# Patient Record
Sex: Male | Born: 1935
Health system: Southern US, Community
[De-identification: ages and names within clinical notes are randomized; demographics above are authoritative.]

## PROBLEM LIST (undated history)

## (undated) DIAGNOSIS — H9190 Unspecified hearing loss, unspecified ear: Secondary | ICD-10-CM

## (undated) DIAGNOSIS — K8020 Calculus of gallbladder without cholecystitis without obstruction: Secondary | ICD-10-CM

## (undated) DIAGNOSIS — E785 Hyperlipidemia, unspecified: Secondary | ICD-10-CM

## (undated) DIAGNOSIS — R351 Nocturia: Secondary | ICD-10-CM

## (undated) DIAGNOSIS — K219 Gastro-esophageal reflux disease without esophagitis: Secondary | ICD-10-CM

## (undated) DIAGNOSIS — N400 Enlarged prostate without lower urinary tract symptoms: Secondary | ICD-10-CM

## (undated) DIAGNOSIS — I10 Essential (primary) hypertension: Secondary | ICD-10-CM

## (undated) DIAGNOSIS — R3915 Urgency of urination: Secondary | ICD-10-CM

## (undated) DIAGNOSIS — N200 Calculus of kidney: Secondary | ICD-10-CM

## (undated) DIAGNOSIS — D494 Neoplasm of unspecified behavior of bladder: Secondary | ICD-10-CM

## (undated) DIAGNOSIS — C801 Malignant (primary) neoplasm, unspecified: Secondary | ICD-10-CM

## (undated) DIAGNOSIS — IMO0001 Reserved for inherently not codable concepts without codable children: Secondary | ICD-10-CM

## (undated) HISTORY — PX: TONSILLECTOMY: SUR1361

---

## 1993-10-22 HISTORY — PX: TRANSURETHRAL RESECTION OF PROSTATE: SHX73

## 1998-04-15 ENCOUNTER — Inpatient Hospital Stay (HOSPITAL_COMMUNITY): Admission: RE | Admit: 1998-04-15 | Discharge: 1998-04-18 | Payer: Self-pay | Admitting: Urology

## 1998-05-14 ENCOUNTER — Emergency Department (HOSPITAL_COMMUNITY): Admission: EM | Admit: 1998-05-14 | Discharge: 1998-05-14 | Payer: Self-pay | Admitting: Emergency Medicine

## 1998-05-15 ENCOUNTER — Emergency Department (HOSPITAL_COMMUNITY): Admission: EM | Admit: 1998-05-15 | Discharge: 1998-05-15 | Payer: Self-pay | Admitting: Emergency Medicine

## 1999-05-07 ENCOUNTER — Emergency Department (HOSPITAL_COMMUNITY): Admission: EM | Admit: 1999-05-07 | Discharge: 1999-05-07 | Payer: Self-pay | Admitting: Emergency Medicine

## 1999-05-07 ENCOUNTER — Encounter: Payer: Self-pay | Admitting: Emergency Medicine

## 1999-06-15 ENCOUNTER — Encounter: Admission: RE | Admit: 1999-06-15 | Discharge: 1999-09-13 | Payer: Self-pay | Admitting: Internal Medicine

## 2000-08-28 ENCOUNTER — Encounter: Admission: RE | Admit: 2000-08-28 | Discharge: 2000-08-28 | Payer: Self-pay | Admitting: Urology

## 2000-08-28 ENCOUNTER — Encounter: Payer: Self-pay | Admitting: Urology

## 2003-02-02 ENCOUNTER — Encounter: Payer: Self-pay | Admitting: Emergency Medicine

## 2003-02-02 ENCOUNTER — Emergency Department (HOSPITAL_COMMUNITY): Admission: EM | Admit: 2003-02-02 | Discharge: 2003-02-02 | Payer: Self-pay | Admitting: Emergency Medicine

## 2008-09-06 ENCOUNTER — Encounter (HOSPITAL_BASED_OUTPATIENT_CLINIC_OR_DEPARTMENT_OTHER): Admission: RE | Admit: 2008-09-06 | Discharge: 2008-09-15 | Payer: Self-pay | Admitting: Internal Medicine

## 2011-03-06 NOTE — Consult Note (Signed)
NAME:  Ryan Tyler, NEIDIG NO.:  [id number]   MEDICAL RECORD NO.:  [id number]          PATIENT TYPE:  REC   LOCATION:  FOOT                         FACILITY:  [facility]   PHYSICIAN:  Jonelle Sports. Sevier, M.D. DATE OF BIRTH:  1928-03-03   DATE OF CONSULTATION:  09/08/2008  DATE OF DISCHARGE:                                 CONSULTATION   This is a 77 year old white male who was seen at the courtesy of Dr.  Johnella Moloney for assistance with management of a popliteal wound on the  right.   The patient has generally enjoyed quite excellent health with exception  of some prostate difficulties and known hypertension.  Several months  ago, he accidentally fell in a well in [place] and sustained a major  open wound to the right popliteus.  This required initial  hospitalization for debridement, antibiotic therapy and suturing both  deep and cutaneous suturing.  At that time, he was released from care  there.  He still had an open wound about the size of a golf ball as he  says.  Since that time, he has been keeping the area clean using  Neosporin on it and although it has progressively decreased in size, it  has not healed so he is here today for our consideration.   His past medical history is really quite innocent as previously  reported.  He has had no surgeries.   He has no known medicinal allergies.   His regular medications include Flomax 0.4 mg daily, lisinopril,  hydrochlorothiazide 20/25 one daily and baby aspirin one daily.   Family history is not contributory to his present problem and is not  reviewed in detail at this time.   PERSONAL HISTORY:  The patient is widowed, lives alone, does not smoke,  use alcoholic beverages or recreational drugs.  He is retired.  He had  tetanus immunization in September 2009.   REVIEW OF SYSTEMS:  Again except for the symptoms of BPH for which he is  on treatment with Flomax as indicated above and for known hypertension  which  he says has been adequately controlled with his medications, he  really has a largely negative review of systems.   It has been mentioned that with his present illness, he has had no  recognized deep infection from the time of the initial wound.  He does  report that he has a tendency toward like sweating with no fever.  It  was thought originally this might be due to his antibiotic therapy but  it has not resolved with discontinuation of that therapy.   EXAMINATION:  Alert, cooperative white male who appears his stated age  of 44 in no immediate distress.  His blood pressure is 167/89, pulse is  77, respirations 20, temperature 98.1.  In the right posterior popliteal  fossa is a small ulcerated area measuring 1.0 x 1.1 x 0.1 cm with some  crusting at its margins but no spreading erythema or other worrisome  signs.  There is minimal edema in that extremity but nothing that should  be adequate to  impair wound healing.   IMPRESSION:  Traumatic wound right popliteal fossa near resolution.   DISPOSITION:  1. The patient today is subjective to debridement of the crust from      this wound and this leaves a nicely healing satisfactory wound      base.  Does not have the appearance of a chronic wound and there is      no evidence for deep or spreading infection.   He was advised to continue cleansing this on a daily basis, then  covering with Neosporin as he has done and in turn using a Band-Aid.   1. With respect to his night sweating, he says that he has taken his      temperature and that he has no fever.  It is noted that he takes      his Flomax around 10 o'clock each evening and I wonder if this is      not a reaction to that medication.  He is to advise to discuss that      with his primary physician and/or with his urologist.   The patient is advised that he may return here on a p.r.n. basis but  that we would not anticipate he will need any more specific care here.  We do  recommend that he keep the wound covered with daily dressing  changes as indicated above and is told again anticipate its healing  probably within the next 2-4 weeks.   We would be happy to see him and return at any point should this be  indicated.           ______________________________  Jonelle Sports. Cheryll Cockayne, M.D.     RES/MEDQ  D:  09/08/2008  T:  09/09/2008  Job:  [id number]   cc:   Candyce Churn, M.D.

## 2011-05-21 ENCOUNTER — Emergency Department (HOSPITAL_COMMUNITY): Payer: Medicare Other

## 2011-05-21 ENCOUNTER — Encounter (HOSPITAL_COMMUNITY): Payer: Self-pay

## 2011-05-21 ENCOUNTER — Emergency Department (HOSPITAL_COMMUNITY)
Admission: EM | Admit: 2011-05-21 | Discharge: 2011-05-21 | Disposition: A | Discharge status: Home / Self Care | Payer: Medicare Other | Attending: Emergency Medicine | Admitting: Emergency Medicine

## 2011-05-21 DIAGNOSIS — R1031 Right lower quadrant pain: Secondary | ICD-10-CM | POA: Insufficient documentation

## 2011-05-21 DIAGNOSIS — R079 Chest pain, unspecified: Secondary | ICD-10-CM | POA: Insufficient documentation

## 2011-05-21 DIAGNOSIS — K8020 Calculus of gallbladder without cholecystitis without obstruction: Secondary | ICD-10-CM | POA: Insufficient documentation

## 2011-05-21 DIAGNOSIS — R110 Nausea: Secondary | ICD-10-CM | POA: Insufficient documentation

## 2011-05-21 DIAGNOSIS — N400 Enlarged prostate without lower urinary tract symptoms: Secondary | ICD-10-CM | POA: Insufficient documentation

## 2011-05-21 DIAGNOSIS — I10 Essential (primary) hypertension: Secondary | ICD-10-CM | POA: Insufficient documentation

## 2011-05-21 HISTORY — DX: Essential (primary) hypertension: I10

## 2011-05-21 LAB — CBC
HCT: 50.6 % (ref 39.0–52.0)
Hemoglobin: 16.8 g/dL (ref 13.0–17.0)
MCH: 30.3 pg (ref 26.0–34.0)
MCHC: 33.2 g/dL (ref 30.0–36.0)
MCV: 91.2 fL (ref 78.0–100.0)
Platelets: 182 10*3/uL (ref 150–400)
RBC: 5.55 MIL/uL (ref 4.22–5.81)
RDW: 12.8 % (ref 11.5–15.5)
WBC: 9.9 10*3/uL (ref 4.0–10.5)

## 2011-05-21 LAB — DIFFERENTIAL
Basophils Absolute: 0 10*3/uL (ref 0.0–0.1)
Basophils Relative: 0 % (ref 0–1)
Eosinophils Absolute: 0.1 10*3/uL (ref 0.0–0.7)
Eosinophils Relative: 1 % (ref 0–5)
Lymphocytes Relative: 13 % (ref 12–46)
Lymphs Abs: 1.3 10*3/uL (ref 0.7–4.0)
Monocytes Absolute: 0.7 10*3/uL (ref 0.1–1.0)
Monocytes Relative: 7 % (ref 3–12)
Neutro Abs: 7.8 10*3/uL — ABNORMAL HIGH (ref 1.7–7.7)
Neutrophils Relative %: 79 % — ABNORMAL HIGH (ref 43–77)

## 2011-05-21 LAB — COMPREHENSIVE METABOLIC PANEL
ALT: 24 U/L (ref 0–53)
AST: 25 U/L (ref 0–37)
Albumin: 4.4 g/dL (ref 3.5–5.2)
Alkaline Phosphatase: 95 U/L (ref 39–117)
BUN: 14 mg/dL (ref 6–23)
CO2: 28 mEq/L (ref 19–32)
Calcium: 10.2 mg/dL (ref 8.4–10.5)
Chloride: 99 mEq/L (ref 96–112)
Creatinine, Ser: 0.91 mg/dL (ref 0.50–1.35)
GFR calc Af Amer: >60 mL/min (ref >60)
GFR calc non Af Amer: >60 mL/min (ref >60)
Glucose, Bld: 113 mg/dL — ABNORMAL HIGH (ref 70–99)
Potassium: 3.8 mEq/L (ref 3.5–5.1)
Sodium: 139 mEq/L (ref 135–145)
Total Bilirubin: 1 mg/dL (ref 0.3–1.2)
Total Protein: 7.1 g/dL (ref 6.0–8.3)

## 2011-05-21 LAB — URINALYSIS, ROUTINE W REFLEX MICROSCOPIC
Bilirubin Urine: NEGATIVE
Glucose, UA: NEGATIVE mg/dL
Hgb urine dipstick: NEGATIVE
Ketones, ur: 15 mg/dL — AB
Leukocytes, UA: NEGATIVE
Nitrite: NEGATIVE
Protein, ur: NEGATIVE mg/dL
Specific Gravity, Urine: 1.014 (ref 1.005–1.030)
Urobilinogen, UA: 1 mg/dL (ref 0.0–1.0)
pH: 7 (ref 5.0–8.0)

## 2011-05-21 LAB — TROPONIN I: Troponin I: <0.3 ng/mL (ref <0.30)

## 2011-05-21 LAB — LIPASE, BLOOD: Lipase: 44 U/L (ref 11–59)

## 2011-05-21 MED ORDER — IOHEXOL 300 MG/ML  SOLN
100.0000 mL | Freq: Once | INTRAMUSCULAR | Status: AC | PRN
  Administered 2011-05-21: 100 mL via INTRAVENOUS

## 2011-05-22 ENCOUNTER — Telehealth (INDEPENDENT_AMBULATORY_CARE_PROVIDER_SITE_OTHER): Payer: Self-pay | Admitting: General Surgery

## 2011-05-23 ENCOUNTER — Telehealth (INDEPENDENT_AMBULATORY_CARE_PROVIDER_SITE_OTHER): Payer: Self-pay

## 2011-05-23 NOTE — Telephone Encounter (Signed)
LMOM for pt to call and reschedule an earlier appt with another provider.  Dr. Donell Beers unavailable.

## 2011-05-28 ENCOUNTER — Ambulatory Visit (INDEPENDENT_AMBULATORY_CARE_PROVIDER_SITE_OTHER): Payer: Medicare Other | Admitting: General Surgery

## 2011-05-29 ENCOUNTER — Ambulatory Visit
Admission: RE | Admit: 2011-05-29 | Discharge: 2011-05-29 | Disposition: A | Discharge status: Home / Self Care | Payer: Medicare Other | Source: Ambulatory Visit | Attending: Internal Medicine | Admitting: Internal Medicine

## 2011-05-29 ENCOUNTER — Other Ambulatory Visit: Payer: Medicare Other

## 2011-05-29 ENCOUNTER — Other Ambulatory Visit: Payer: Self-pay | Admitting: Internal Medicine

## 2011-05-29 DIAGNOSIS — R1013 Epigastric pain: Secondary | ICD-10-CM

## 2011-05-29 DIAGNOSIS — R634 Abnormal weight loss: Secondary | ICD-10-CM

## 2011-05-29 DIAGNOSIS — K769 Liver disease, unspecified: Secondary | ICD-10-CM

## 2011-05-29 MED ORDER — GADOBENATE DIMEGLUMINE 529 MG/ML IV SOLN
17.0000 mL | Freq: Once | INTRAVENOUS | Status: AC | PRN
  Administered 2011-05-29: 17 mL via INTRAVENOUS

## 2011-06-07 ENCOUNTER — Ambulatory Visit (INDEPENDENT_AMBULATORY_CARE_PROVIDER_SITE_OTHER): Payer: Medicare Other | Admitting: General Surgery

## 2011-06-22 ENCOUNTER — Ambulatory Visit (INDEPENDENT_AMBULATORY_CARE_PROVIDER_SITE_OTHER): Payer: Medicare Other | Admitting: General Surgery

## 2011-06-22 ENCOUNTER — Encounter (INDEPENDENT_AMBULATORY_CARE_PROVIDER_SITE_OTHER): Payer: Self-pay | Admitting: General Surgery

## 2011-06-22 VITALS — BP 136/76 | HR 80 | Temp 96.7°F | Ht 72.0 in | Wt 170.5 lb

## 2011-06-22 DIAGNOSIS — R109 Unspecified abdominal pain: Secondary | ICD-10-CM

## 2011-06-22 DIAGNOSIS — K8020 Calculus of gallbladder without cholecystitis without obstruction: Secondary | ICD-10-CM

## 2011-06-22 DIAGNOSIS — K4090 Unilateral inguinal hernia, without obstruction or gangrene, not specified as recurrent: Secondary | ICD-10-CM

## 2011-06-22 NOTE — Progress Notes (Signed)
Chief Complaint  Patient presents with  . Other     new pt- eval of gallstones and hernia     HPI Ryan Tyler is a 77 y.o. male.  This patient is referred by Dr. Kevan Ny for evaluation of gallstone found on abdominal MRI as well as for left inguinal hernia. He states that he has had multiple episodes of pain across his abdomen after eating which he describes as "fire in his belly". He states that this usually lasts 4-5 hours and usually occurs after eating dinner in the meantime. He has some associated nausea but no vomiting and he actually complains more of back pain but this has not necessarily after eating. He does have some reflux and some belching and he takes some Prilosec for treatment of this but it has not caused any relief of his back pain or abdominal pain. He states that he is normally constipated and takes a stool softener for this but denies any blood in the stool or melena. He has not had any prior colonoscopy before and has had a 20 pound weight loss. Currently he is on a low-fat diet to help with relief of his symptoms.He also has a reducible left inguinal bulge on exam which is large and has been increasing in size but he states that this has been present for many years and does not cause him any symptoms.  HPI  Past Medical History  Diagnosis Date  . Hypertension   . Prostatic hypertrophy   . Weight loss   . Hearing loss   . Abdominal pain   . Constipation   . Difficulty urinating   . Easy bruising     Past Surgical History  Procedure Date  . Prostate surgery     History reviewed. No pertinent family history.  Social History History  Substance Use Topics  . Smoking status: Never Smoker   . Smokeless tobacco: Not on file  . Alcohol Use: Yes    No Known Allergies  Current Outpatient Prescriptions  Medication Sig Dispense Refill  . aspirin 81 MG tablet Take 81 mg by mouth daily.        Marland Kitchen HYDROcodone-acetaminophen (NORCO) 5-325 MG per tablet       .  lisinopril-hydrochlorothiazide (PRINZIDE,ZESTORETIC) 20-25 MG per tablet       . omeprazole (PRILOSEC) 20 MG capsule Take 20 mg by mouth daily.        . sildenafil (VIAGRA) 50 MG tablet Take 50 mg by mouth daily as needed.        Marland Kitchen RAPAFLO 8 MG CAPS capsule         Review of Systems Review of Systems  Constitutional: Positive for unexpected weight change.  HENT: Positive for hearing loss.   Gastrointestinal: Positive for nausea, abdominal pain and constipation.  Genitourinary: Positive for difficulty urinating.  Skin: Positive for wound.  Hematological: Bruises/bleeds easily.  All other systems reviewed and are negative.    Blood pressure 136/76, pulse 80, temperature 96.7 F (35.9 C), height 6' (1.829 m), weight 170 lb 8 oz (77.338 kg).  Physical Exam Physical Exam  Constitutional: He is oriented to person, place, and time. He appears well-developed and well-nourished. No distress.  HENT:  Head: Normocephalic and atraumatic.  Eyes: Conjunctivae and EOM are normal. Pupils are equal, round, and reactive to light.  Neck: Normal range of motion. Neck supple. No tracheal deviation present.  Cardiovascular: Normal rate, regular rhythm and normal heart sounds.   Pulmonary/Chest: Effort normal and breath  sounds normal. No stridor.  Abdominal: Soft. Bowel sounds are normal. He exhibits no distension and no mass. There is no tenderness. There is no rebound and no guarding.  Musculoskeletal: Normal range of motion. He exhibits no edema.  Neurological: He is alert and oriented to person, place, and time.  Skin: Skin is warm and dry. No rash noted. He is not diaphoretic. No erythema. No pallor.  Psychiatric: He has a normal mood and affect. His behavior is normal. Judgment and thought content normal.      Assessment    Abdominal pain and cholelithiasis.  Although he does have postprandial upper abdominal discomfort, as well as a gallstone found on abdominal MRI, I am not certain that  his abdominal pain is due to the cholelithiasis. It certainly could be attributed to it, and I discussed this with him. I just am not sure that he is having enough symptoms to really pinpoint this to the gallbladder.  Reducible left internal hernia.  He does have a very large reducible, left inguinal hernia on exam. This is currently asymptomatic and has been present for several years. He does not seem very interested in repair of this now. We can discuss the risks of incarceration stimulation and he expressed understanding.    Plan    I discussed these problems with the patient and I called his primary provider Dr. Kevan Ny to discuss them as well. Though I think that some of his abdominal symptoms could certainly be attributed to the cholelithiasis, I'm not certain that this is his cause of the pain. After discussion of the situation with Dr. Kevan Ny his primary provider, we will plan to see him back in one month for repeat evaluation. If he is still having abdominal symptoms then I would consider cholecystectomy for him. In the meantime, I would recommend colonoscopy given his weight loss and that he has not had any prior colonoscopy. Also with regard to his reducible hernia, we will plan for watchful waiting with this since he is asymptomatic and is not interested in repair at this time.       Lodema Pilot DAVID 06/22/2011, 1:37 PM

## 2011-06-22 NOTE — Patient Instructions (Signed)
Call us back at [phone] if interested in surgery. Follow up with gastroenterologist for screening colonoscopy

## 2012-10-19 ENCOUNTER — Emergency Department (HOSPITAL_COMMUNITY)
Admission: EM | Admit: 2012-10-19 | Discharge: 2012-10-20 | Disposition: A | Discharge status: Home / Self Care | Payer: Medicare Other | Attending: Emergency Medicine | Admitting: Emergency Medicine

## 2012-10-19 ENCOUNTER — Emergency Department (HOSPITAL_COMMUNITY): Payer: Medicare Other

## 2012-10-19 ENCOUNTER — Encounter (HOSPITAL_COMMUNITY): Payer: Self-pay | Admitting: *Deleted

## 2012-10-19 DIAGNOSIS — Z79899 Other long term (current) drug therapy: Secondary | ICD-10-CM | POA: Insufficient documentation

## 2012-10-19 DIAGNOSIS — B9789 Other viral agents as the cause of diseases classified elsewhere: Secondary | ICD-10-CM | POA: Insufficient documentation

## 2012-10-19 DIAGNOSIS — R059 Cough, unspecified: Secondary | ICD-10-CM

## 2012-10-19 DIAGNOSIS — R05 Cough: Secondary | ICD-10-CM

## 2012-10-19 DIAGNOSIS — B349 Viral infection, unspecified: Secondary | ICD-10-CM

## 2012-10-19 DIAGNOSIS — R0981 Nasal congestion: Secondary | ICD-10-CM

## 2012-10-19 DIAGNOSIS — R509 Fever, unspecified: Secondary | ICD-10-CM | POA: Insufficient documentation

## 2012-10-19 DIAGNOSIS — Z7982 Long term (current) use of aspirin: Secondary | ICD-10-CM | POA: Insufficient documentation

## 2012-10-19 DIAGNOSIS — Z8719 Personal history of other diseases of the digestive system: Secondary | ICD-10-CM | POA: Insufficient documentation

## 2012-10-19 DIAGNOSIS — Z8619 Personal history of other infectious and parasitic diseases: Secondary | ICD-10-CM | POA: Insufficient documentation

## 2012-10-19 DIAGNOSIS — J3489 Other specified disorders of nose and nasal sinuses: Secondary | ICD-10-CM | POA: Insufficient documentation

## 2012-10-19 DIAGNOSIS — Z87898 Personal history of other specified conditions: Secondary | ICD-10-CM | POA: Insufficient documentation

## 2012-10-19 DIAGNOSIS — I10 Essential (primary) hypertension: Secondary | ICD-10-CM | POA: Insufficient documentation

## 2012-10-19 DIAGNOSIS — Z8669 Personal history of other diseases of the nervous system and sense organs: Secondary | ICD-10-CM | POA: Insufficient documentation

## 2012-10-19 DIAGNOSIS — Z87448 Personal history of other diseases of urinary system: Secondary | ICD-10-CM | POA: Insufficient documentation

## 2012-10-19 NOTE — ED Notes (Signed)
Pt c/o fever off and on since yesterday; chills; minimal cough; concerned due to recent loss of two brothers with pneumonia

## 2012-10-19 NOTE — ED Notes (Signed)
OZH:[id number]<HQ> Expected date:10/19/12<BR> Expected time: 7:55 PM<BR> Means of arrival:<BR> Comments:<BR> closed

## 2012-10-19 NOTE — ED Notes (Signed)
Bed:WA05<BR> Expected date:<BR> Expected time:<BR> Means of arrival:<BR> Comments:<BR>

## 2012-10-20 LAB — URINALYSIS, ROUTINE W REFLEX MICROSCOPIC
Bilirubin Urine: NEGATIVE
Glucose, UA: NEGATIVE mg/dL
Ketones, ur: NEGATIVE mg/dL
Nitrite: NEGATIVE
Protein, ur: NEGATIVE mg/dL
Specific Gravity, Urine: 1.025 (ref 1.005–1.030)
Urobilinogen, UA: 1 mg/dL (ref 0.0–1.0)
pH: 6 (ref 5.0–8.0)

## 2012-10-20 LAB — CBC WITH DIFFERENTIAL/PLATELET
Basophils Absolute: 0 10*3/uL (ref 0.0–0.1)
Basophils Relative: 0 % (ref 0–1)
Eosinophils Absolute: 0 10*3/uL (ref 0.0–0.7)
Eosinophils Relative: 0 % (ref 0–5)
HCT: 46.7 % (ref 39.0–52.0)
Hemoglobin: 15.8 g/dL (ref 13.0–17.0)
Lymphocytes Relative: 4 % — ABNORMAL LOW (ref 12–46)
Lymphs Abs: 0.3 10*3/uL — ABNORMAL LOW (ref 0.7–4.0)
MCH: 31.2 pg (ref 26.0–34.0)
MCHC: 33.8 g/dL (ref 30.0–36.0)
MCV: 92.3 fL (ref 78.0–100.0)
Monocytes Absolute: 0.7 10*3/uL (ref 0.1–1.0)
Monocytes Relative: 9 % (ref 3–12)
Neutro Abs: 7.3 10*3/uL (ref 1.7–7.7)
Neutrophils Relative %: 87 % — ABNORMAL HIGH (ref 43–77)
Platelets: 153 10*3/uL (ref 150–400)
RBC: 5.06 MIL/uL (ref 4.22–5.81)
RDW: 13.4 % (ref 11.5–15.5)
WBC: 8.3 10*3/uL (ref 4.0–10.5)

## 2012-10-20 LAB — BASIC METABOLIC PANEL
BUN: 16 mg/dL (ref 6–23)
CO2: 24 mEq/L (ref 19–32)
Calcium: 8.7 mg/dL (ref 8.4–10.5)
Chloride: 100 mEq/L (ref 96–112)
Creatinine, Ser: 1.03 mg/dL (ref 0.50–1.35)
GFR calc Af Amer: 76 mL/min — ABNORMAL LOW (ref >90)
GFR calc non Af Amer: 66 mL/min — ABNORMAL LOW (ref >90)
Glucose, Bld: 132 mg/dL — ABNORMAL HIGH (ref 70–99)
Potassium: 4.2 mEq/L (ref 3.5–5.1)
Sodium: 134 mEq/L — ABNORMAL LOW (ref 135–145)

## 2012-10-20 LAB — URINE MICROSCOPIC-ADD ON

## 2012-10-20 MED ORDER — IBUPROFEN 200 MG PO TABS
400.0000 mg | ORAL_TABLET | Freq: Once | ORAL | Status: AC
  Administered 2012-10-20: 400 mg via ORAL
  Filled 2012-10-20: qty 2

## 2012-10-20 MED ORDER — GUAIFENESIN-CODEINE 100-10 MG/5ML PO SYRP: 5.0000 mL | ORAL_SOLUTION | Freq: Three times a day (TID) | ORAL | Status: DC | PRN

## 2012-10-20 NOTE — ED Provider Notes (Signed)
History     CSN: [id number]  Arrival date & time 10/19/12  2121   First MD Initiated Contact with Patient 10/20/12 0012      No chief complaint on file.   (Consider location/radiation/quality/duration/timing/severity/associated sxs/prior treatment) HPIPaul Shela Commons Tyler is a 78 y.o. male presents with a history of congestion, rhinorrhea, mild sore throat and a cough is been going on for a few days. Patient was concerned because he took; night, woke up and felt hot, his son took his temperature he had a temperature of 103.8. Patient's cough has been nonproductive. He is concerned because he had a recent also 2 of his brothers with pneumonia. He's not been dizzy, no lightheadedness, no headache, no chest pain, no shortness of breath, no abdominal pain, nausea vomiting or diarrhea, no ear pain or sinus pressure.   Past Medical History  Diagnosis Date  . Hypertension   . Prostatic hypertrophy   . Weight loss   . Hearing loss   . Abdominal pain   . Constipation   . Difficulty urinating   . Easy bruising     Past Surgical History  Procedure Date  . Prostate surgery     No family history on file.  History  Substance Use Topics  . Smoking status: Never Smoker   . Smokeless tobacco: Not on file  . Alcohol Use: Yes     Review of Systems At least 10pt or greater review of systems completed and are negative except where specified in the HPI.  Allergies  Review of patient's allergies indicates no known allergies.  Home Medications   Current Outpatient Rx  Name  Route  Sig  Dispense  Refill  . ASPIRIN 81 MG PO TABS   Oral   Take 81 mg by mouth daily.           . GUAIFENESIN-CODEINE 100-10 MG/5ML PO SYRP   Oral   Take 5 mLs by mouth 3 (three) times daily as needed for cough.   120 mL   0   . HYDROCODONE-ACETAMINOPHEN 5-325 MG PO TABS               . LISINOPRIL-HYDROCHLOROTHIAZIDE 20-25 MG PO TABS               . OMEPRAZOLE 20 MG PO CPDR   Oral   Take 20 mg  by mouth daily.           Marland Kitchen RAPAFLO 8 MG PO CAPS               . SILDENAFIL CITRATE 50 MG PO TABS   Oral   Take 50 mg by mouth daily as needed.             BP 158/71  Pulse 94  Temp 99.4 F (37.4 C)  Resp 20  SpO2 97%  Physical Exam  Nursing notes reviewed.  Electronic medical record reviewed. VITAL SIGNS:   Filed Vitals:   10/19/12 2136  BP: 158/71  Pulse: 94  Temp: 99.4 F (37.4 C)  Resp: 20  SpO2: 97%   CONSTITUTIONAL: Awake, oriented, appears non-toxic HENT: Atraumatic, normocephalic, oral mucosa pink and moist, airway patent. . External ears normal. EYES: Conjunctiva clear, EOMI, PERRLA NECK: Trachea midline, non-tender, supple CARDIOVASCULAR: Normal heart rate, Normal rhythm, No murmurs, rubs, gallops PULMONARY/CHEST: Clear to auscultation, no rhonchi, wheezes, or rales. Symmetrical breath sounds. Non-tender. ABDOMINAL: Non-distended, soft, non-tender - no rebound or guarding.  BS normal. NEUROLOGIC: Non-focal, moving all four extremities, no gross  sensory or motor deficits. EXTREMITIES: No clubbing, cyanosis, or edema SKIN: Warm, Dry, No erythema, No rash  ED Course  Procedures (including critical care time)  Labs Reviewed  CBC WITH DIFFERENTIAL - Abnormal; Notable for the following:    Neutrophils Relative 87 (*)     Lymphocytes Relative 4 (*)     Lymphs Abs 0.3 (*)     All other components within normal limits  URINALYSIS, ROUTINE W REFLEX MICROSCOPIC - Abnormal; Notable for the following:    Hgb urine dipstick TRACE (*)     Leukocytes, UA TRACE (*)     All other components within normal limits  BASIC METABOLIC PANEL - Abnormal; Notable for the following:    Sodium 134 (*)     Glucose, Bld 132 (*)     GFR calc non Af Amer 66 (*)     GFR calc Af Amer 76 (*)     All other components within normal limits  URINE MICROSCOPIC-ADD ON  LAB REPORT - SCANNED   Dg Chest 2 View  10/19/2012  *RADIOLOGY REPORT*  Clinical Data: Cough and fever.   CHEST - 2 VIEW  Comparison: None.  Findings: Lungs are adequately inflated without consolidation or effusion.  The cardiomediastinal silhouette is within normal. There are several old posterior lateral right rib fractures.  There is mild spondylosis of the spine.  IMPRESSION: No acute cardiopulmonary disease.   Original Report Authenticated By: Elberta Fortis, M.D.      1. Viral syndrome   2. Fever   3. Nasal congestion   4. Cough       MDM  Ryan Tyler is a 78 y.o. male presenting with viral type upper respiratory symptoms, including cough. Patient's concern for pneumonia-chest x-ray is clear, lungs are clear. Patient does not have an elevated white count. She does not complaining about any dysuria or frequency and urinalysis is unremarkable.  I think the patient's fevers and symptoms are likely caused by a viral upper store syndrome he is nontoxic, we'll discharge her with symptomatic prescription.  During patient encounter, patient's son wasn't disruptive in the emergency department do to long wait times, was in the hallway saying "this is malpractice" - charge nurse and physician calm patient down and achieved good service recovery.  Patient was not upset with the service.  I explained the diagnosis and have given explicit precautions to return to the ER including any other new or worsening symptoms. The patient understands and accepts the medical plan as it's been dictated and I have answered their questions. Discharge instructions concerning home care and prescriptions have been given.  The patient is STABLE and is discharged to home in good condition.         Jones Skene, MD 10/20/12 [phone]

## 2013-08-22 HISTORY — PX: CATARACT EXTRACTION W/ INTRAOCULAR LENS IMPLANT: SHX1309

## 2014-03-10 ENCOUNTER — Other Ambulatory Visit: Payer: Self-pay | Admitting: Urology

## 2014-03-30 ENCOUNTER — Encounter (HOSPITAL_BASED_OUTPATIENT_CLINIC_OR_DEPARTMENT_OTHER): Payer: Self-pay | Admitting: *Deleted

## 2014-03-31 ENCOUNTER — Encounter (HOSPITAL_BASED_OUTPATIENT_CLINIC_OR_DEPARTMENT_OTHER): Payer: Self-pay | Admitting: *Deleted

## 2014-03-31 NOTE — Progress Notes (Signed)
03/31/14 1146  OBSTRUCTIVE SLEEP APNEA  Have you ever been diagnosed with sleep apnea through a sleep study? No  Do you snore loudly (loud enough to be heard through closed doors)?  0  Do you often feel tired, fatigued, or sleepy during the daytime? 0  Has anyone observed you stop breathing during your sleep? 0  Do you have, or are you being treated for high blood pressure? 1  BMI more than 35 kg/m2? 0  Age over 78 years old? 1  Neck circumference greater than 40 cm/16 inches? 1  Gender: 1  Obstructive Sleep Apnea Score 4  Score 4 or greater  Results sent to PCP

## 2014-03-31 NOTE — Progress Notes (Signed)
NPO AFTER MN. ARRIVE AT 0600.  NEEDS ISTAT AND EKG. PT IS VERY HOH.

## 2014-04-04 NOTE — H&P (Signed)
History of Present Illness    BPH with LUTS: He underwent a TURP in 1994 and in 1999. He did well for some time and then developed frequency, urgency and nocturia. He was placed on Flomax with good control of the symptoms however he eventually stopped his medication.    Nephrolithiasis: He has a distant history of nephrolithiasis treated in 8/93. He has subsequently passed stones spontaneously.     Interval history: He experienced gross, painless hematuria that was not associated with any voiding symptoms. No new complaints were noted today. He's not had any further hematuria.   Past Medical History  Problems  1. History of cholelithiasis (V12.79) 2. History of hypercholesterolemia (V12.29) 3. History of hypertension (V12.59) 4. History of Left inguinal hernia (550.90)     Surgical History Problems  1. History of Tonsillectomy 2. History of Transurethral Resection Of Prostate (TURP)  Current Meds 1. Aspirin 81 MG Oral Tablet;  Therapy: (Recorded:21Mar2013) to Recorded 2. Centrum Silver Oral Tablet;  Therapy: (Recorded:30Apr2012) to Recorded 3. Lisinopril-Hydrochlorothiazide 20-25 MG Oral Tablet;  Therapy: (Recorded:27Feb2009) to Recorded 4. Simvastatin TABS;  Therapy: (Recorded:07May2015) to Recorded  Allergies Medication  1. No Known Drug Allergies  Family History Problems  1. Family history of Family Health Status Number Of Children   1 daughter 2. No pertinent family history : Mother  Social History Problems  1. Alcohol Use 2. Caffeine Use   2 3. Family history of Death In The Family Father   age 88 cancer 22. Family history of Death In The Family Mother   age 62 cancer 17. Marital History - Single   wife-deceased 6. Never a smoker 7. Occupation:   retired 26. History of Tobacco Use (V15.82)   1 pack, 8 years, quit 22 years ago   Review of Systems Genitourinary, constitutional, skin, eye, otolaryngeal, hematologic/lymphatic, cardiovascular,  pulmonary, endocrine, musculoskeletal, gastrointestinal, neurological and psychiatric system(s) were reviewed and pertinent findings if present are noted.  Genitourinary: urinary frequency, feelings of urinary urgency, nocturia, difficulty starting the urinary stream and urinary stream starts and stops, but no dysuria.  Constitutional: night sweats.  Musculoskeletal: back pain.    Vitals Height: 6 ft  Weight: 186 lb  BMI Calculated: 25.23 BSA Calculated: 2.07 Blood Pressure: 167 / 84 Temperature: 97.5 F Heart Rate: 68  Physical Exam Constitutional: Well nourished and well developed . No acute distress. Appears younger than his stated age.  ENT:. The ears and nose are normal in appearance.  Neck: The appearance of the neck is normal and no neck mass is present.  Pulmonary: No respiratory distress and normal respiratory rhythm and effort.  Cardiovascular: Heart rate and rhythm are normal . No peripheral edema.  Abdomen: The abdomen is soft and nontender. No masses are palpated. No CVA tenderness. No hernias are palpable. No hepatosplenomegaly noted.  Rectal: Rectal exam demonstrates normal sphincter tone, no tenderness and no masses. The prostate has no nodularity and is not tender. The left seminal vesicle is nonpalpable. The right seminal vesicle is nonpalpable. The perineum is normal on inspection. Genitourinary: Examination of the penis demonstrates no discharge, no masses, no lesions and a normal meatus. The scrotum is without lesions. The right epididymis is palpably normal and non-tender. The left epididymis is palpably normal and non-tender. The right testis is non-tender and without masses. The left testis is non-tender and without masses.  Lymphatics: The femoral and inguinal nodes are not enlarged or tender.  Skin: Normal skin turgor, no visible rash and no visible skin lesions.  Neuro/Psych:. Mood and affect are appropriate.    BUN & CREATININE [id number] 02:27PM Kathie Rhodes SPECIMEN TYPE: BLOOD  Test Name Result Flag Reference CREATININE 1.03 mg/dL  0.50-1.50 BUN 24 mg/dL H 6-23 Est GFR, African American 77 mL/min   Est GFR, NonAfrican American 66 mL/min   THE ESTIMATED GFR IS A CALCULATION VALID FOR ADULTS (>=54 YEARS OLD) THAT USES THE CKD-EPI ALGORITHM TO ADJUST FOR AGE AND SEX. IT IS   NOT TO BE USED FOR CHILDREN, PREGNANT WOMEN, HOSPITALIZED PATIENTS,    PATIENTS ON DIALYSIS, OR WITH RAPIDLY CHANGING KIDNEY FUNCTION. ACCORDING TO THE NKDEP, EGFR >89 IS NORMAL, 60-89 SHOWS MILD IMPAIRMENT, 30-59 SHOWS MODERATE IMPAIRMENT, 15-29 SHOWS SEVERE IMPAIRMENT AND <15 IS ESRD.  PSA REFLEX TO FREE [id number] 02:27PM Kathie Rhodes SPECIMEN TYPE: BLOOD  Test Name Result Flag Reference PSA 1.55 ng/mL  <=4.00 TEST METHODOLOGY: ECLIA PSA (ELECTROCHEMILUMINESCENCE IMMUNOASSAY)  Procedure  Cystoscopy done on 03/09/14  Indication: Hematuria.  Informed Consent: Risks, benefits, and potential adverse events were discussed and informed consent was obtained from the patient.  Prep: The patient was prepped with betadine.  Procedure Note:  Urethral meatus:. No abnormalities.  Anterior urethra: No abnormalities.  Prostatic urethra:. There was a lot of prosthetic regrowth that was lobular and appeared to be partially obstructing.  Bladder: Visulization was clear. The ureteral orifices were in the normal anatomic position bilaterally and had clear efflux of urine. The mucosa was smooth without abnormalities. A solitary tumor was visualized in the bladder. A papillary tumor was seen in the bladder. This tumor was located on the right side, at the neck of the bladder. The patient tolerated the procedure well.  Complications: None.    Assessment   I went over the results of his workup which has revealed a normal creatinine of 1.03, a normal PSA of 1.55 and a negative urine culture.   His CT scan has revealed bilateral, nonobstructing renal calculi (Hounsfield units  of 1300) with no ureteral stones noted.  Bilateral renal cysts were identified on the CT scan and these appear stable compared to his previous CT scan and MRI scan in 7/12.     I went over the results of the CT scan as well as my cystoscopic findings today which have revealed a bladder mass consistent with transitional cell carcinoma. We discussed the fact that currently there is no evidence of extravesical extension or pelvic adenopathy based on CT scan findings. Further characterization of the lesion is required for grading and staging purposes. We discussed proceeding with evaluation using transurethral resection of the lesion. I have discussed the procedure in detail as well as the potential risks and complications associated with this form of surgery. We also discussed the probability of successful resection of the intravesical portion of this lesion. I have recommended, as long as there is no contraindication at the time of surgery, the placement of intravesical mitomycin-C in order to reduce the risk of recurrence. We did discuss the potential side effects of this form of intravesical chemotherapy. The procedure will be performed under anesthesia as an outpatient.   Plan   He will be scheduled for outpatient transurethral resection of his bladder tumor and intravesical mitomycin C postoperatively.

## 2014-04-05 ENCOUNTER — Encounter (HOSPITAL_BASED_OUTPATIENT_CLINIC_OR_DEPARTMENT_OTHER)
Admission: RE | Disposition: A | Discharge status: Home / Self Care | Payer: Self-pay | Source: Ambulatory Visit | Attending: Urology

## 2014-04-05 ENCOUNTER — Ambulatory Visit (HOSPITAL_BASED_OUTPATIENT_CLINIC_OR_DEPARTMENT_OTHER)
Admission: RE | Admit: 2014-04-05 | Discharge: 2014-04-05 | Disposition: A | Discharge status: Home / Self Care | Payer: Medicare Other | Source: Ambulatory Visit | Attending: Urology | Admitting: Urology

## 2014-04-05 ENCOUNTER — Ambulatory Visit (HOSPITAL_BASED_OUTPATIENT_CLINIC_OR_DEPARTMENT_OTHER): Payer: Medicare Other | Admitting: Anesthesiology

## 2014-04-05 ENCOUNTER — Encounter (HOSPITAL_BASED_OUTPATIENT_CLINIC_OR_DEPARTMENT_OTHER): Payer: Self-pay | Admitting: *Deleted

## 2014-04-05 ENCOUNTER — Encounter (HOSPITAL_BASED_OUTPATIENT_CLINIC_OR_DEPARTMENT_OTHER): Payer: Medicare Other | Admitting: Anesthesiology

## 2014-04-05 DIAGNOSIS — I10 Essential (primary) hypertension: Secondary | ICD-10-CM | POA: Insufficient documentation

## 2014-04-05 DIAGNOSIS — C679 Malignant neoplasm of bladder, unspecified: Secondary | ICD-10-CM | POA: Insufficient documentation

## 2014-04-05 DIAGNOSIS — Z79899 Other long term (current) drug therapy: Secondary | ICD-10-CM | POA: Insufficient documentation

## 2014-04-05 DIAGNOSIS — E7800 Pure hypercholesterolemia, unspecified: Secondary | ICD-10-CM | POA: Insufficient documentation

## 2014-04-05 DIAGNOSIS — Z7982 Long term (current) use of aspirin: Secondary | ICD-10-CM | POA: Insufficient documentation

## 2014-04-05 DIAGNOSIS — D494 Neoplasm of unspecified behavior of bladder: Secondary | ICD-10-CM

## 2014-04-05 HISTORY — DX: Urgency of urination: R39.15

## 2014-04-05 HISTORY — DX: Calculus of gallbladder without cholecystitis without obstruction: K80.20

## 2014-04-05 HISTORY — DX: Benign prostatic hyperplasia without lower urinary tract symptoms: N40.0

## 2014-04-05 HISTORY — DX: Hyperlipidemia, unspecified: E78.5

## 2014-04-05 HISTORY — PX: TRANSURETHRAL RESECTION OF BLADDER TUMOR WITH GYRUS (TURBT-GYRUS): SHX6458

## 2014-04-05 HISTORY — DX: Nocturia: R35.1

## 2014-04-05 HISTORY — DX: Neoplasm of unspecified behavior of bladder: D49.4

## 2014-04-05 HISTORY — DX: Unspecified hearing loss, unspecified ear: H91.90

## 2014-04-05 HISTORY — DX: Reserved for inherently not codable concepts without codable children: IMO0001

## 2014-04-05 LAB — POCT I-STAT, CHEM 8
BUN: 16 mg/dL (ref 6–23)
Calcium, Ion: 1.3 mmol/L (ref 1.13–1.30)
Chloride: 102 mEq/L (ref 96–112)
Creatinine, Ser: 0.9 mg/dL (ref 0.50–1.35)
Glucose, Bld: 123 mg/dL — ABNORMAL HIGH (ref 70–99)
HCT: 48 % (ref 39.0–52.0)
Hemoglobin: 16.3 g/dL (ref 13.0–17.0)
Potassium: 4 mEq/L (ref 3.7–5.3)
Sodium: 144 mEq/L (ref 137–147)
TCO2: 25 mmol/L (ref 0–100)

## 2014-04-05 SURGERY — TRANSURETHRAL RESECTION OF BLADDER TUMOR WITH GYRUS (TURBT-GYRUS)
Anesthesia: General | Site: Bladder

## 2014-04-05 MED ORDER — HYDROCODONE-ACETAMINOPHEN 5-325 MG PO TABS
ORAL_TABLET | ORAL | Status: AC
  Filled 2014-04-05: qty 1

## 2014-04-05 MED ORDER — CIPROFLOXACIN IN D5W 200 MG/100ML IV SOLN
200.0000 mg | INTRAVENOUS | Status: AC
  Administered 2014-04-05: 200 mg via INTRAVENOUS
  Filled 2014-04-05: qty 100

## 2014-04-05 MED ORDER — SODIUM CHLORIDE 0.9 % IR SOLN
Status: DC | PRN
  Administered 2014-04-05: 6000 mL via INTRAVESICAL

## 2014-04-05 MED ORDER — MITOMYCIN CHEMO FOR BLADDER INSTILLATION 40 MG
40.0000 mg | Freq: Once | INTRAVENOUS | Status: AC
  Administered 2014-04-05: 40 mg via INTRAVESICAL
  Filled 2014-04-05: qty 40

## 2014-04-05 MED ORDER — MIDAZOLAM HCL 2 MG/2ML IJ SOLN
INTRAMUSCULAR | Status: AC
  Filled 2014-04-05: qty 2

## 2014-04-05 MED ORDER — HYDROCODONE-ACETAMINOPHEN 10-325 MG PO TABS: 1.0000 | ORAL_TABLET | ORAL | Status: DC | PRN

## 2014-04-05 MED ORDER — DEXAMETHASONE SODIUM PHOSPHATE 4 MG/ML IJ SOLN
INTRAMUSCULAR | Status: DC | PRN
  Administered 2014-04-05: 4 mg via INTRAVENOUS

## 2014-04-05 MED ORDER — STERILE WATER FOR IRRIGATION IR SOLN
Status: DC | PRN
  Administered 2014-04-05: 500 mL

## 2014-04-05 MED ORDER — FENTANYL CITRATE 0.05 MG/ML IJ SOLN
INTRAMUSCULAR | Status: AC
  Filled 2014-04-05: qty 4

## 2014-04-05 MED ORDER — 0.9 % SODIUM CHLORIDE (POUR BTL) OPTIME
TOPICAL | Status: DC | PRN
  Administered 2014-04-05: 500 mL

## 2014-04-05 MED ORDER — HYDROCODONE-ACETAMINOPHEN 5-325 MG PO TABS
1.0000 | ORAL_TABLET | ORAL | Status: DC | PRN
  Administered 2014-04-05: 1 via ORAL
  Filled 2014-04-05: qty 2

## 2014-04-05 MED ORDER — FENTANYL CITRATE 0.05 MG/ML IJ SOLN
INTRAMUSCULAR | Status: DC | PRN
  Administered 2014-04-05: 25 ug via INTRAVENOUS
  Administered 2014-04-05: 50 ug via INTRAVENOUS

## 2014-04-05 MED ORDER — PHENAZOPYRIDINE HCL 100 MG PO TABS
ORAL_TABLET | ORAL | Status: AC
  Filled 2014-04-05: qty 2

## 2014-04-05 MED ORDER — PROPOFOL 10 MG/ML IV BOLUS
INTRAVENOUS | Status: DC | PRN
  Administered 2014-04-05: 120 mg via INTRAVENOUS

## 2014-04-05 MED ORDER — FENTANYL CITRATE 0.05 MG/ML IJ SOLN
INTRAMUSCULAR | Status: AC
  Filled 2014-04-05: qty 2

## 2014-04-05 MED ORDER — ONDANSETRON HCL 4 MG/2ML IJ SOLN
INTRAMUSCULAR | Status: DC | PRN
Start: 2014-04-05 — End: 2014-04-05
  Administered 2014-04-05: 4 mg via INTRAVENOUS

## 2014-04-05 MED ORDER — PHENAZOPYRIDINE HCL 200 MG PO TABS: 200.0000 mg | ORAL_TABLET | Freq: Three times a day (TID) | ORAL | Status: DC | PRN

## 2014-04-05 MED ORDER — FENTANYL CITRATE 0.05 MG/ML IJ SOLN
25.0000 ug | INTRAMUSCULAR | Status: DC | PRN
  Administered 2014-04-05: 50 ug via INTRAVENOUS
  Administered 2014-04-05 (×2): 25 ug via INTRAVENOUS
  Filled 2014-04-05: qty 1

## 2014-04-05 MED ORDER — PHENAZOPYRIDINE HCL 200 MG PO TABS
200.0000 mg | ORAL_TABLET | Freq: Once | ORAL | Status: AC
  Administered 2014-04-05: 200 mg via ORAL
  Filled 2014-04-05: qty 1

## 2014-04-05 MED ORDER — LACTATED RINGERS IV SOLN
INTRAVENOUS | Status: DC
  Administered 2014-04-05 (×2): via INTRAVENOUS
  Filled 2014-04-05: qty 1000

## 2014-04-05 MED ORDER — LIDOCAINE HCL (CARDIAC) 20 MG/ML IV SOLN
INTRAVENOUS | Status: DC | PRN
  Administered 2014-04-05: 40 mg via INTRAVENOUS

## 2014-04-05 SURGICAL SUPPLY — 24 items
BAG DRAIN URO-CYSTO SKYTR STRL (DRAIN) ×2 IMPLANT
BAG URINE DRAINAGE (UROLOGICAL SUPPLIES) IMPLANT
BAG URINE LEG 19OZ MD ST LTX (BAG) IMPLANT
CANISTER SUCT LVC 12 LTR MEDI- (MISCELLANEOUS) ×4 IMPLANT
CATH FOLEY 2WAY SLVR  5CC 20FR (CATHETERS) ×1
CATH FOLEY 2WAY SLVR 5CC 20FR (CATHETERS) ×1 IMPLANT
CLOTH BEACON ORANGE TIMEOUT ST (SAFETY) ×2 IMPLANT
DRAPE CAMERA CLOSED 9X96 (DRAPES) ×2 IMPLANT
ELECT BUTTON HF 24-28F 2 30DE (ELECTRODE) IMPLANT
ELECT LOOP MED HF 24F 12D CBL (CLIP) ×2 IMPLANT
ELECT RESECT VAPORIZE 12D CBL (ELECTRODE) IMPLANT
EVACUATOR MICROVAS BLADDER (UROLOGICAL SUPPLIES) ×2 IMPLANT
GLOVE BIO SURGEON STRL SZ8 (GLOVE) ×2 IMPLANT
GLOVE BIOGEL M STER SZ 6 (GLOVE) ×2 IMPLANT
GLOVE INDICATOR 6.5 STRL GRN (GLOVE) ×4 IMPLANT
GOWN STRL REUS W/TWL LRG LVL3 (GOWN DISPOSABLE) ×2 IMPLANT
GOWN STRL REUS W/TWL XL LVL3 (GOWN DISPOSABLE) ×2 IMPLANT
HOLDER FOLEY CATH W/STRAP (MISCELLANEOUS) IMPLANT
IV NS IRRIG 3000ML ARTHROMATIC (IV SOLUTION) ×4 IMPLANT
NS IRRIG 500ML POUR BTL (IV SOLUTION) ×2 IMPLANT
PACK CYSTOSCOPY (CUSTOM PROCEDURE TRAY) ×2 IMPLANT
PLUG CATH AND CAP STER (CATHETERS) ×2 IMPLANT
SET ASPIRATION TUBING (TUBING) ×2 IMPLANT
WATER STERILE IRR 500ML POUR (IV SOLUTION) ×2 IMPLANT

## 2014-04-05 NOTE — Anesthesia Preprocedure Evaluation (Addendum)
Anesthesia Evaluation  Patient identified by MRN, date of birth, ID band Patient awake    Reviewed: Allergy & Precautions, H&P , NPO status , Patient's Chart, lab work & pertinent test results  Airway Mallampati: II TM Distance: >3 FB Neck ROM: Full    Dental no notable dental hx.    Pulmonary neg pulmonary ROS,  breath sounds clear to auscultation  Pulmonary exam normal       Cardiovascular Exercise Tolerance: Good hypertension, Pt. on medications negative cardio ROS  Rhythm:Regular Rate:Normal  ECG: SB, RBBB   Neuro/Psych negative neurological ROS  negative psych ROS   GI/Hepatic negative GI ROS, Neg liver ROS,   Endo/Other  negative endocrine ROS  Renal/GU negative Renal ROS  negative genitourinary   Musculoskeletal negative musculoskeletal ROS (+)   Abdominal   Peds negative pediatric ROS (+)  Hematology negative hematology ROS (+)   Anesthesia Other Findings   Reproductive/Obstetrics negative OB ROS                          Anesthesia Physical Anesthesia Plan  ASA: II  Anesthesia Plan: General   Post-op Pain Management:    Induction: Intravenous  Airway Management Planned: LMA  Additional Equipment:   Intra-op Plan:   Post-operative Plan: Extubation in OR  Informed Consent: I have reviewed the patients History and Physical, chart, labs and discussed the procedure including the risks, benefits and alternatives for the proposed anesthesia with the patient or authorized representative who has indicated his/her understanding and acceptance.   Dental advisory given  Plan Discussed with: CRNA  Anesthesia Plan Comments:         Anesthesia Quick Evaluation

## 2014-04-05 NOTE — Op Note (Addendum)
PATIENT:  Ryan Tyler  PRE-OPERATIVE DIAGNOSIS: Bladder tumor  POST-OPERATIVE DIAGNOSIS: Same  PROCEDURE:  Procedure(s): 1. TRANSURETHRAL RESECTION OF BLADDER TUMOR (TURBT) (2 cm.) 2. Intravesical instillation of chemotherapy (mitomycin-C) in PACU.  SURGEON:  Surgeon(s): Claybon Jabs  ANESTHESIA:   General  EBL:  Minimal  DRAINS: Urinary Catheter (20 Fr. Foley)   SPECIMEN:  Source of Specimen:  Bladder tumor  DISPOSITION OF SPECIMEN:  PATHOLOGY  Indication: Mr. Haberman is an 80 year old male who experienced gross, painless hematuria and was evaluated with a CT scan which revealed no abnormality of the upper tract to account for his gross hematuria other than bilateral renal calculi. The study did however reveal any area of possible enhancement associated with what appeared to be the prostate. Cystoscopically I found evidence of prior transurethral resection of the prostate with lobular regrowth but also found a papillary tumor located on the floor of the bladder on the right-hand side just posterior and lateral to the right ureteral orifice. No other bladder tumors were identified. He therefore is brought to the operating room for transurethral resection of his bladder tumor and postoperative instillation of mitomycin-C.  Description of operation: The patient was taken to the operating room and administered general anesthesia. He was then placed on the table and moved to the dorsal lithotomy position after which his genitalia was sterilely prepped and draped. An official timeout was then performed.  The 63 French resectoscope with 12 lens and visual obturator were passed down the urethra under direct visualization. There were no abnormalities of the urethra. The prostatic urethra revealed evidence of prior resection with some areas of regrowth in a nodular fashion. There were no worrisome lesions found within the prostatic urethra. The visual obturator was removed and replaced with the  Gyrus resectoscope element with 12 lens and the bladder was fully and systematically inspected. Ureteral orifices were noted to be in the normal anatomic positions. A single papillary tumor was located just posterior and lateral to the right ureteral orifice but did not involve the UO. This was photographed. No other tumors, stones or inflammatory lesions were identified within the bladder.  I first began by resecting papillary portion of the lesion down to the level of the bladder wall. I then used the Microvasive evacuator to remove this portion and it was sent to pathology labeled bladder tumor. I then resected deeper into the wall of the bladder at this location. Leaking points were cauterized and the right ureteral orifice was spared and noted to be intact. Reinspection of the bladder revealed all obvious tumor had been fully resected and there was no evidence of perforation. The Microvasive evacuator was then used to irrigate the bladder and remove all of the portions of deeper resection and were sent to pathology labeled base of bladder tumor. I then removed the resectoscope.  A 20 French Foley catheter was then inserted in the bladder and irrigated. The irrigant returned slightly pink with no clots. The patient was awakened and taken to the recovery room.  While in the recovery room 40 mg of mitomycin-C in 40 cc of water were instilled in the bladder through the catheter and the catheter was plugged. This will remain indwelling for approximately one hour. It will then be drained from the bladder and the catheter will be removed and the patient discharged home.  PLAN OF CARE: Discharge to home after PACU  PATIENT DISPOSITION:  PACU - hemodynamically stable.

## 2014-04-05 NOTE — Transfer of Care (Signed)
Immediate Anesthesia Transfer of Care Note  Patient: Ryan Tyler  Procedure(s) Performed: Procedure(s) (LRB): TRANSURETHRAL RESECTION OF BLADDER TUMOR WITH GYRUS  AND INTRAVESICO CHEMO (N/A)  Patient Location: PACU  Anesthesia Type: General  Level of Consciousness: awake, alert  and oriented  Airway & Oxygen Therapy: Patient Spontanous Breathing and Patient connected to face mask oxygen  Post-op Assessment: Report given to PACU RN and Post -op Vital signs reviewed and stable  Post vital signs: Reviewed and stable  Complications: No apparent anesthesia complications

## 2014-04-05 NOTE — Anesthesia Procedure Notes (Signed)
Procedure Name: LMA Insertion Date/Time: 04/05/2014 7:36 AM Performed by: Mechele Claude Pre-anesthesia Checklist: Patient identified, Emergency Drugs available, Suction available and Patient being monitored Patient Re-evaluated:Patient Re-evaluated prior to inductionOxygen Delivery Method: Circle System Utilized Preoxygenation: Pre-oxygenation with 100% oxygen Intubation Type: IV induction Ventilation: Mask ventilation without difficulty LMA: LMA inserted LMA Size: 4.0 Number of attempts: 1 Airway Equipment and Method: bite block Placement Confirmation: positive ETCO2 Tube secured with: Tape Dental Injury: Teeth and Oropharynx as per pre-operative assessment

## 2014-04-05 NOTE — Discharge Instructions (Signed)
Transurethral Resection of Bladder Tumor (TURBT)   Definition:  Transurethral Resection of the Bladder Tumor is a surgical procedure used to diagnose and remove tumors within the bladder. TURBT is the most common treatment for early stage bladder cancer.  General instructions:     Your recent bladder surgery requires very little post hospital care but some definite precautions.  Despite the fact that no skin incisions were used, the area around the bladder incisions are raw and covered with scabs to promote healing and prevent bleeding. Certain precautions are needed to insure that the scabs are not disturbed over the next 2-4 weeks while the healing proceeds.  Because the raw surface inside your bladder and the irritating effects of urine you may expect frequency of urination and/or urgency (a stronger desire to urinate) and perhaps even getting up at night more often. This will usually resolve or improve slowly over the healing period. You may see some blood in your urine over the first 6 weeks. Do not be alarmed, even if the urine was clear for a while. Get off your feet and drink lots of fluids until clearing occurs. If you start to pass clots or don't improve call us.  Catheter: (If you are discharged with a catheter.)  1. Keep your catheter secured to your leg at all times with tape or the supplied strap. 2. You may experience leakage of urine around your catheter- as long as the  catheter continues to drain, this is normal.  If your catheter stops draining  go to the ER. 3. You may also have blood in your urine, even after it has been clear for  several days; you may even pass some small blood clots or other material.  This  is normal as well.  If this happens, sit down and drink plenty of water to help  make urine to flush out your bladder.  If the blood in your urine becomes worse  after doing this, contact our office or return to the ER. 4. You may use the leg bag (small bag)  during the day, but use the large bag at  night.  Diet:  You may return to your normal diet immediately. Because of the raw surface of your bladder, alcohol, spicy foods, foods high in acid and drinks with caffeine may cause irritation or frequency and should be used in moderation. To keep your urine flowing freely and avoid constipation, drink plenty of fluids during the day (8-10 glasses). Tip: Avoid cranberry juice because it is very acidic.  Activity:  Your physical activity doesn't need to be restricted. However, if you are very active, you may see some blood in the urine. We suggest that you reduce your activity under the circumstances until the bleeding has stopped.  Bowels:  It is important to keep your bowels regular during the postoperative period. Straining with bowel movements can cause bleeding. A bowel movement every other day is reasonable. Use a mild laxative if needed, such as milk of magnesia 2-3 tablespoons, or 2 Dulcolax tablets. Call if you continue to have problems. If you had been taking narcotics for pain, before, during or after your surgery, you may be constipated. Take a laxative if necessary.    Medication:  You should resume your pre-surgery medications unless told not to. In addition you may be given an antibiotic to prevent or treat infection. Antibiotics are not always necessary. All medication should be taken as prescribed until the bottles are finished unless you are having  an unusual reaction to one of the drugs.    Post Anesthesia Home Care Instructions  Activity: Get plenty of rest for the remainder of the day. A responsible adult should stay with you for 24 hours following the procedure.  For the next 24 hours, DO NOT: -Drive a car -Operate machinery -Drink alcoholic beverages -Take any medication unless instructed by your physician -Make any legal decisions or sign important papers.  Meals: Start with liquid foods such as gelatin or soup.  Progress to regular foods as tolerated. Avoid greasy, spicy, heavy foods. If nausea and/or vomiting occur, drink only clear liquids until the nausea and/or vomiting subsides. Call your physician if vomiting continues.  Special Instructions/Symptoms: Your throat may feel dry or sore from the anesthesia or the breathing tube placed in your throat during surgery. If this causes discomfort, gargle with warm salt water. The discomfort should disappear within 24 hours.        

## 2014-04-05 NOTE — Anesthesia Postprocedure Evaluation (Signed)
  Anesthesia Post-op Note  Patient: Ryan Tyler  Procedure(s) Performed: Procedure(s) (LRB): TRANSURETHRAL RESECTION OF BLADDER TUMOR WITH GYRUS  AND INTRAVESICO CHEMO (N/A)  Patient Location: PACU  Anesthesia Type: General  Level of Consciousness: awake and alert   Airway and Oxygen Therapy: Patient Spontanous Breathing  Post-op Pain: mild  Post-op Assessment: Post-op Vital signs reviewed, Patient's Cardiovascular Status Stable, Respiratory Function Stable, Patent Airway and No signs of Nausea or vomiting  Last Vitals:  Filed Vitals:   04/05/14 0915  BP: 159/74  Pulse: 60  Temp:   Resp: 21    Post-op Vital Signs: stable   Complications: No apparent anesthesia complications

## 2014-04-07 ENCOUNTER — Encounter (HOSPITAL_BASED_OUTPATIENT_CLINIC_OR_DEPARTMENT_OTHER): Payer: Self-pay | Admitting: Urology

## 2015-06-30 ENCOUNTER — Other Ambulatory Visit: Payer: Self-pay | Admitting: Nurse Practitioner

## 2015-06-30 DIAGNOSIS — R1084 Generalized abdominal pain: Secondary | ICD-10-CM

## 2015-07-04 ENCOUNTER — Ambulatory Visit
Admission: RE | Admit: 2015-07-04 | Discharge: 2015-07-04 | Disposition: A | Discharge status: Home / Self Care | Payer: Medicare Other | Source: Ambulatory Visit | Attending: Nurse Practitioner | Admitting: Nurse Practitioner

## 2015-07-04 DIAGNOSIS — R1084 Generalized abdominal pain: Secondary | ICD-10-CM

## 2015-08-15 ENCOUNTER — Encounter (HOSPITAL_COMMUNITY): Payer: Self-pay | Admitting: *Deleted

## 2015-08-15 NOTE — Progress Notes (Signed)
[name]-Pre-op call completed by pt son and POA, Ryan Tyler. Jamarco denies pt having any acute cardiopulmonary issues. [name] stated that his father had an EKG and other cardiac studies done at [facility] with Dr. Inda Merlin; records requested. Spoke with [name] at Dr. Cristal Generous office, [name] to fax clearance note from Dr. Inda Merlin. [name] made aware to stop administering Aspirin, otc vitamins and herbal medications. Do not give any NSAIDs ie: Ibuprofen, Advil, Naproxen or any medication containing Aspirin to pt. [name] verbalized understanding of all pre-op instructions.

## 2015-08-16 ENCOUNTER — Observation Stay (HOSPITAL_COMMUNITY)
Admission: RE | Admit: 2015-08-16 | Discharge: 2015-08-17 | Disposition: A | Discharge status: Home / Self Care | Payer: Medicare Other | Source: Ambulatory Visit | Attending: General Surgery | Admitting: General Surgery

## 2015-08-16 ENCOUNTER — Encounter (HOSPITAL_COMMUNITY): Payer: Self-pay | Admitting: *Deleted

## 2015-08-16 ENCOUNTER — Ambulatory Visit (HOSPITAL_COMMUNITY): Payer: Medicare Other | Admitting: Certified Registered"

## 2015-08-16 ENCOUNTER — Encounter (HOSPITAL_COMMUNITY)
Admission: RE | Disposition: A | Discharge status: Home / Self Care | Payer: Self-pay | Source: Ambulatory Visit | Attending: General Surgery

## 2015-08-16 DIAGNOSIS — E7800 Pure hypercholesterolemia, unspecified: Secondary | ICD-10-CM | POA: Insufficient documentation

## 2015-08-16 DIAGNOSIS — Z79899 Other long term (current) drug therapy: Secondary | ICD-10-CM | POA: Insufficient documentation

## 2015-08-16 DIAGNOSIS — I10 Essential (primary) hypertension: Secondary | ICD-10-CM | POA: Diagnosis not present

## 2015-08-16 DIAGNOSIS — K8010 Calculus of gallbladder with chronic cholecystitis without obstruction: Secondary | ICD-10-CM | POA: Diagnosis not present

## 2015-08-16 DIAGNOSIS — Z9049 Acquired absence of other specified parts of digestive tract: Secondary | ICD-10-CM

## 2015-08-16 DIAGNOSIS — K219 Gastro-esophageal reflux disease without esophagitis: Secondary | ICD-10-CM | POA: Insufficient documentation

## 2015-08-16 DIAGNOSIS — Z7982 Long term (current) use of aspirin: Secondary | ICD-10-CM | POA: Diagnosis not present

## 2015-08-16 DIAGNOSIS — K8050 Calculus of bile duct without cholangitis or cholecystitis without obstruction: Secondary | ICD-10-CM | POA: Diagnosis present

## 2015-08-16 HISTORY — PX: CHOLECYSTECTOMY: SHX55

## 2015-08-16 HISTORY — DX: Malignant (primary) neoplasm, unspecified: C80.1

## 2015-08-16 HISTORY — DX: Gastro-esophageal reflux disease without esophagitis: K21.9

## 2015-08-16 HISTORY — DX: Calculus of kidney: N20.0

## 2015-08-16 LAB — CBC
HCT: 45.4 % (ref 39.0–52.0)
Hemoglobin: 14.7 g/dL (ref 13.0–17.0)
MCH: 30.9 pg (ref 26.0–34.0)
MCHC: 32.4 g/dL (ref 30.0–36.0)
MCV: 95.4 fL (ref 78.0–100.0)
Platelets: 152 10*3/uL (ref 150–400)
RBC: 4.76 MIL/uL (ref 4.22–5.81)
RDW: 13.2 % (ref 11.5–15.5)
WBC: 5.7 10*3/uL (ref 4.0–10.5)

## 2015-08-16 LAB — BASIC METABOLIC PANEL
Anion gap: 9 (ref 5–15)
BUN: 19 mg/dL (ref 6–20)
CO2: 23 mmol/L (ref 22–32)
Calcium: 8.9 mg/dL (ref 8.9–10.3)
Chloride: 109 mmol/L (ref 101–111)
Creatinine, Ser: 1.06 mg/dL (ref 0.61–1.24)
GFR calc Af Amer: >60 mL/min (ref >60)
GFR calc non Af Amer: >60 mL/min (ref >60)
Glucose, Bld: 134 mg/dL — ABNORMAL HIGH (ref 65–99)
Potassium: 4.3 mmol/L (ref 3.5–5.1)
Sodium: 141 mmol/L (ref 135–145)

## 2015-08-16 SURGERY — LAPAROSCOPIC CHOLECYSTECTOMY
Anesthesia: General | Site: Abdomen

## 2015-08-16 MED ORDER — ONDANSETRON HCL 4 MG/2ML IJ SOLN: 4.0000 mg | Freq: Four times a day (QID) | INTRAMUSCULAR | Status: DC | PRN

## 2015-08-16 MED ORDER — LISINOPRIL-HYDROCHLOROTHIAZIDE 20-25 MG PO TABS: 1.0000 | ORAL_TABLET | Freq: Every morning | ORAL | Status: DC

## 2015-08-16 MED ORDER — HYDROMORPHONE HCL 1 MG/ML IJ SOLN: 0.2500 mg | INTRAMUSCULAR | Status: DC | PRN

## 2015-08-16 MED ORDER — HYDROCHLOROTHIAZIDE 25 MG PO TABS: 25.0000 mg | ORAL_TABLET | Freq: Every day | ORAL | Status: DC

## 2015-08-16 MED ORDER — FENTANYL CITRATE (PF) 250 MCG/5ML IJ SOLN
INTRAMUSCULAR | Status: AC
  Filled 2015-08-16: qty 5

## 2015-08-16 MED ORDER — PROPOFOL 10 MG/ML IV BOLUS
INTRAVENOUS | Status: DC | PRN
  Administered 2015-08-16: 50 mg via INTRAVENOUS
  Administered 2015-08-16: 150 mg via INTRAVENOUS

## 2015-08-16 MED ORDER — HEMOSTATIC AGENTS (NO CHARGE) OPTIME
TOPICAL | Status: DC | PRN
  Administered 2015-08-16: 1 via TOPICAL

## 2015-08-16 MED ORDER — MEPERIDINE HCL 25 MG/ML IJ SOLN: 6.2500 mg | INTRAMUSCULAR | Status: DC | PRN

## 2015-08-16 MED ORDER — CEFAZOLIN SODIUM-DEXTROSE 2-3 GM-% IV SOLR
INTRAVENOUS | Status: DC | PRN
  Administered 2015-08-16: 2 g via INTRAVENOUS

## 2015-08-16 MED ORDER — OXYCODONE HCL 5 MG PO TABS: 5.0000 mg | ORAL_TABLET | ORAL | Status: DC | PRN

## 2015-08-16 MED ORDER — ACETAMINOPHEN 325 MG PO TABS: 650.0000 mg | ORAL_TABLET | Freq: Four times a day (QID) | ORAL | Status: DC | PRN

## 2015-08-16 MED ORDER — ONDANSETRON HCL 4 MG/2ML IJ SOLN
INTRAMUSCULAR | Status: AC
  Filled 2015-08-16: qty 4

## 2015-08-16 MED ORDER — MORPHINE SULFATE (PF) 2 MG/ML IV SOLN
INTRAVENOUS | Status: AC
Start: 2015-08-16 — End: 2015-08-17
  Filled 2015-08-16: qty 1

## 2015-08-16 MED ORDER — ENOXAPARIN SODIUM 40 MG/0.4ML ~~LOC~~ SOLN
40.0000 mg | SUBCUTANEOUS | Status: DC
Start: 2015-08-17 — End: 2015-08-17

## 2015-08-16 MED ORDER — BUPIVACAINE-EPINEPHRINE 0.25% -1:200000 IJ SOLN
INTRAMUSCULAR | Status: DC | PRN
  Administered 2015-08-16: 10 mL

## 2015-08-16 MED ORDER — ONDANSETRON HCL 4 MG/2ML IJ SOLN
INTRAMUSCULAR | Status: DC | PRN
  Administered 2015-08-16: 4 mg via INTRAVENOUS

## 2015-08-16 MED ORDER — CEFAZOLIN SODIUM-DEXTROSE 2-3 GM-% IV SOLR
INTRAVENOUS | Status: AC
  Filled 2015-08-16: qty 50

## 2015-08-16 MED ORDER — DEXAMETHASONE SODIUM PHOSPHATE 4 MG/ML IJ SOLN
INTRAMUSCULAR | Status: AC
  Filled 2015-08-16: qty 2

## 2015-08-16 MED ORDER — ONDANSETRON 4 MG PO TBDP: 4.0000 mg | ORAL_TABLET | Freq: Four times a day (QID) | ORAL | Status: DC | PRN

## 2015-08-16 MED ORDER — SODIUM CHLORIDE 0.9 % IR SOLN
Status: DC | PRN
  Administered 2015-08-16: 1000 mL

## 2015-08-16 MED ORDER — PROMETHAZINE HCL 25 MG/ML IJ SOLN: 6.2500 mg | INTRAMUSCULAR | Status: DC | PRN

## 2015-08-16 MED ORDER — ROCURONIUM BROMIDE 100 MG/10ML IV SOLN
INTRAVENOUS | Status: DC | PRN
  Administered 2015-08-16: 15 mg via INTRAVENOUS
  Administered 2015-08-16: 35 mg via INTRAVENOUS

## 2015-08-16 MED ORDER — GLYCOPYRROLATE 0.2 MG/ML IJ SOLN
INTRAMUSCULAR | Status: AC
  Filled 2015-08-16: qty 2

## 2015-08-16 MED ORDER — GLYCOPYRROLATE 0.2 MG/ML IJ SOLN
INTRAMUSCULAR | Status: DC | PRN
  Administered 2015-08-16: 0.4 mg via INTRAVENOUS

## 2015-08-16 MED ORDER — PROPOFOL 10 MG/ML IV BOLUS
INTRAVENOUS | Status: AC
  Filled 2015-08-16: qty 20

## 2015-08-16 MED ORDER — 0.9 % SODIUM CHLORIDE (POUR BTL) OPTIME
TOPICAL | Status: DC | PRN
  Administered 2015-08-16: 1000 mL

## 2015-08-16 MED ORDER — ACETAMINOPHEN 650 MG RE SUPP: 650.0000 mg | Freq: Four times a day (QID) | RECTAL | Status: DC | PRN

## 2015-08-16 MED ORDER — LISINOPRIL 20 MG PO TABS: 20.0000 mg | ORAL_TABLET | Freq: Every day | ORAL | Status: DC

## 2015-08-16 MED ORDER — LIDOCAINE HCL (CARDIAC) 20 MG/ML IV SOLN
INTRAVENOUS | Status: AC
  Filled 2015-08-16: qty 5

## 2015-08-16 MED ORDER — FENTANYL CITRATE (PF) 250 MCG/5ML IJ SOLN
INTRAMUSCULAR | Status: DC | PRN
  Administered 2015-08-16 (×2): 50 ug via INTRAVENOUS

## 2015-08-16 MED ORDER — MORPHINE SULFATE (PF) 2 MG/ML IV SOLN
2.0000 mg | INTRAVENOUS | Status: DC | PRN
  Administered 2015-08-16: 2 mg via INTRAVENOUS
  Filled 2015-08-16: qty 1

## 2015-08-16 MED ORDER — NEOSTIGMINE METHYLSULFATE 10 MG/10ML IV SOLN
INTRAVENOUS | Status: DC | PRN
  Administered 2015-08-16: 3 mg via INTRAVENOUS

## 2015-08-16 MED ORDER — ROCURONIUM BROMIDE 50 MG/5ML IV SOLN
INTRAVENOUS | Status: AC
  Filled 2015-08-16: qty 1

## 2015-08-16 MED ORDER — ASPIRIN 81 MG PO CHEW: 81.0000 mg | CHEWABLE_TABLET | Freq: Every day | ORAL | Status: DC

## 2015-08-16 MED ORDER — NEOSTIGMINE METHYLSULFATE 10 MG/10ML IV SOLN
INTRAVENOUS | Status: AC
Start: 2015-08-16 — End: 2015-08-16
  Filled 2015-08-16: qty 1

## 2015-08-16 MED ORDER — BUPIVACAINE-EPINEPHRINE (PF) 0.25% -1:200000 IJ SOLN
INTRAMUSCULAR | Status: AC
  Filled 2015-08-16: qty 30

## 2015-08-16 MED ORDER — SODIUM CHLORIDE 0.9 % IV SOLN
INTRAVENOUS | Status: DC
  Administered 2015-08-17: 03:00:00 via INTRAVENOUS

## 2015-08-16 MED ORDER — LIDOCAINE HCL (CARDIAC) 20 MG/ML IV SOLN
INTRAVENOUS | Status: DC | PRN
  Administered 2015-08-16: 100 mg via INTRAVENOUS

## 2015-08-16 MED ORDER — LACTATED RINGERS IV SOLN
INTRAVENOUS | Status: DC
  Administered 2015-08-16 (×2): via INTRAVENOUS

## 2015-08-16 SURGICAL SUPPLY — 38 items
APPLIER CLIP 5 13 M/L LIGAMAX5 (MISCELLANEOUS) ×2
BLADE SURG ROTATE 9660 (MISCELLANEOUS) ×2 IMPLANT
CANISTER SUCTION 2500CC (MISCELLANEOUS) ×2 IMPLANT
CHLORAPREP W/TINT 26ML (MISCELLANEOUS) ×2 IMPLANT
CLIP APPLIE 5 13 M/L LIGAMAX5 (MISCELLANEOUS) ×1 IMPLANT
COVER SURGICAL LIGHT HANDLE (MISCELLANEOUS) ×2 IMPLANT
DEVICE TROCAR PUNCTURE CLOSURE (ENDOMECHANICALS) ×2 IMPLANT
ELECT REM PT RETURN 9FT ADLT (ELECTROSURGICAL) ×2
ELECTRODE REM PT RTRN 9FT ADLT (ELECTROSURGICAL) ×1 IMPLANT
GLOVE BIO SURGEON STRL SZ 6.5 (GLOVE) ×4 IMPLANT
GLOVE BIO SURGEON STRL SZ7 (GLOVE) ×2 IMPLANT
GLOVE BIOGEL PI IND STRL 6.5 (GLOVE) ×3 IMPLANT
GLOVE BIOGEL PI IND STRL 7.5 (GLOVE) ×1 IMPLANT
GLOVE BIOGEL PI INDICATOR 6.5 (GLOVE) ×3
GLOVE BIOGEL PI INDICATOR 7.5 (GLOVE) ×1
GOWN STRL REUS W/ TWL LRG LVL3 (GOWN DISPOSABLE) ×3 IMPLANT
GOWN STRL REUS W/TWL LRG LVL3 (GOWN DISPOSABLE) ×3
HEMOSTAT SNOW SURGICEL 2X4 (HEMOSTASIS) ×2 IMPLANT
KIT BASIN OR (CUSTOM PROCEDURE TRAY) ×2 IMPLANT
KIT ROOM TURNOVER OR (KITS) ×2 IMPLANT
LIQUID BAND (GAUZE/BANDAGES/DRESSINGS) ×2 IMPLANT
NS IRRIG 1000ML POUR BTL (IV SOLUTION) ×2 IMPLANT
PAD ARMBOARD 7.5X6 YLW CONV (MISCELLANEOUS) ×2 IMPLANT
POUCH RETRIEVAL ECOSAC 10 (ENDOMECHANICALS) ×1 IMPLANT
POUCH RETRIEVAL ECOSAC 10MM (ENDOMECHANICALS) ×1
SCISSORS LAP 5X35 DISP (ENDOMECHANICALS) ×2 IMPLANT
SET IRRIG TUBING LAPAROSCOPIC (IRRIGATION / IRRIGATOR) ×2 IMPLANT
SLEEVE ENDOPATH XCEL 5M (ENDOMECHANICALS) ×4 IMPLANT
SPECIMEN JAR SMALL (MISCELLANEOUS) ×2 IMPLANT
STRIP CLOSURE SKIN 1/2X4 (GAUZE/BANDAGES/DRESSINGS) ×2 IMPLANT
SUT MNCRL AB 4-0 PS2 18 (SUTURE) ×2 IMPLANT
SUT VICRYL 0 UR6 27IN ABS (SUTURE) ×2 IMPLANT
TOWEL OR 17X24 6PK STRL BLUE (TOWEL DISPOSABLE) ×2 IMPLANT
TOWEL OR 17X26 10 PK STRL BLUE (TOWEL DISPOSABLE) ×2 IMPLANT
TRAY LAPAROSCOPIC MC (CUSTOM PROCEDURE TRAY) ×2 IMPLANT
TROCAR XCEL BLUNT TIP 100MML (ENDOMECHANICALS) ×2 IMPLANT
TROCAR XCEL NON-BLD 5MMX100MML (ENDOMECHANICALS) ×2 IMPLANT
TUBING INSUFFLATION (TUBING) ×2 IMPLANT

## 2015-08-16 NOTE — Anesthesia Postprocedure Evaluation (Signed)
Anesthesia Post Note  Patient: Ryan Tyler  Procedure(s) Performed: Procedure(s) (LRB): LAPAROSCOPIC CHOLECYSTECTOMY (N/A)  Anesthesia type: General  Patient location: PACU  Post pain: Pain level controlled  Post assessment: Post-op Vital signs reviewed  Last Vitals: BP 154/56 mmHg  Pulse 60  Temp(Src) 36.3 C (Oral)  Resp 23  Ht 6\' 2"  (1.88 m)  Wt 185 lb 9 oz (84.171 kg)  BMI 23.81 kg/m2  SpO2 96%  Post vital signs: Reviewed  Level of consciousness: sedated  Complications: No apparent anesthesia complications

## 2015-08-16 NOTE — Op Note (Signed)
Preoperative diagnosis: biliary colic Postoperative diagnosis: same as above Procedure: laparoscopic cholecystectomy  Surgeon: Dr Serita Grammes Anesthesia: general EBL: minimal Drains none Specimen gb and contents to pathology Complications: none Sponge count correct at completion Disposition to recovery stable  Indications: This is an 57 yom who has biliary colic and stone on ultrasound We discussed proceeding with lap chole.   Procedure: After informed consent was obtained the patient was taken to the operating room. He was already given antibiotics. Sequential compression devices were on his legs. He was placed under general anesthesia without complication. His abdomen was prepped and draped in the standard sterile surgical fashion. A surgical timeout was then performed.  I infiltrated marcaine below the umbilicus. I made an incision and then entered the fascia sharply. I then entered the peritoneum bluntly. There was no evidence of an entry injury. I placed a 0 vicryl pursestring suture and inserted a hasson trocar. I then inserted 3 further 5 mm trocars in the epigastrium and ruq. There were a lot of adhesions to the gallbladder. I was able to free these bluntly. I was able to retract the gallbladder cephalad and lateral. The gallbladder had a necrotic spot and this tore leaking bile.  Eventually I was able to identify the cystic duct and clearly had the critical view of safety. I then clipped the cystic duct and divided it. The duct was viable and the clips traversed the duct. I also clipped the artery and divided it. I then removed the gallbladder from the liver bed and placed it in a bag. It was then removed from the umbilical incision. I then obtained hemostasis and irrigated.. I then removed the umbilical trocar and closed with 0 vicryl and the endoclose device after tying down the pursestring.  I then desufflated the abdomen and removed all my remaining trocars. I then  closed these with 4-0 Monocryl and Dermabond. He tolerated this well was extubated and transferred to the recovery room in stable condition

## 2015-08-16 NOTE — Discharge Instructions (Signed)
[facility] LAPAROSCOPIC SURGERY: POST OP INSTRUCTIONS  Always review your discharge instruction sheet given to you by the facility where your surgery was performed. IF YOU HAVE DISABILITY OR FAMILY LEAVE FORMS, YOU MUST BRING THEM TO THE OFFICE FOR PROCESSING.   DO NOT GIVE THEM TO YOUR DOCTOR.  1. A prescription for pain medication may be given to you upon discharge.  Take your pain medication as prescribed, if needed.  If narcotic pain medicine is not needed, then you may take acetaminophen (Tylenol), naprosyn (Alleve), or ibuprofen (Advil) as needed. 2. Take your usually prescribed medications unless otherwise directed. 3. If you need a refill on your pain medication, please contact your pharmacy.  They will contact our office to request authorization. Prescriptions will not be filled after 5pm or on week-ends. 4. You should follow a light diet the first few days after arrival home, such as soup and crackers, etc.  Be sure to include lots of fluids daily. 5. Most patients will experience some swelling and bruising in the area of the incisions.  Ice packs will help.  Swelling and bruising can take several days to resolve.  6. It is common to experience some constipation if taking pain medication after surgery.  Increasing fluid intake and taking a stool softener (such as Colace) will usually help or prevent this problem from occurring.  A mild laxative (Milk of Magnesia or Miralax) should be taken according to package instructions if there are no bowel movements after 48 hours. 7. Unless discharge instructions indicate otherwise, you may remove your bandages 48 hours after surgery, and you may shower at that time.  You may have steri-strips (small skin tapes) in place directly over the incision.  These strips should be left on the skin for 7-10 days.  If your surgeon used skin glue on the incision, you may shower in 24 hours.  The glue will flake  off over the next 2-3 weeks.  Any sutures or staples will be removed at the office during your follow-up visit. 8. ACTIVITIES:  You may resume regular (light) daily activities beginning the next day--such as daily self-care, walking, climbing stairs--gradually increasing activities as tolerated.  You may have sexual intercourse when it is comfortable.  Refrain from any heavy lifting or straining until approved by your doctor. a. You may drive when you are no longer taking prescription pain medication, you can comfortably wear a seatbelt, and you can safely maneuver your car and apply brakes. b. RETURN TO WORK:  __________________________________________________________ 9. You should see your doctor in the office for a follow-up appointment approximately 2-3 weeks after your surgery.  Make sure that you call for this appointment within a day or two after you arrive home to insure a convenient appointment time. 10. OTHER INSTRUCTIONS: __________________________________________________________________________________________________________________________ __________________________________________________________________________________________________________________________ WHEN TO CALL YOUR DOCTOR: 1. Fever over 101.0 2. Inability to urinate 3. Continued bleeding from incision. 4. Increased pain, redness, or drainage from the incision. 5. Increasing abdominal pain  The clinic staff is available to answer your questions during regular business hours.  Please don't hesitate to call and ask to speak to one of the nurses for clinical concerns.  If you have a medical emergency, go to the nearest emergency room or call 911.  A surgeon from [facility] is always on call at the hospital. [address], [address], [place], [place]  [address] ? [address] [address], [place], [place]   [address] [phone] ? [phone] ? [phone] Web site: [email or web address]  

## 2015-08-16 NOTE — Interval H&P Note (Signed)
History and Physical Interval Note:  08/16/2015 10:07 AM  Ryan Tyler  has presented today for surgery, with the diagnosis of GALLSTONES  The various methods of treatment have been discussed with the patient and family. After consideration of risks, benefits and other options for treatment, the patient has consented to  Procedure(s): LAPAROSCOPIC CHOLECYSTECTOMY (N/A) as a surgical intervention .  The patient's history has been reviewed, patient examined, no change in status, stable for surgery.  I have reviewed the patient's chart and labs.  Questions were answered to the patient's satisfaction.     [name]

## 2015-08-16 NOTE — Anesthesia Preprocedure Evaluation (Addendum)
Anesthesia Evaluation  Patient identified by MRN, date of birth, ID band Patient awake    Reviewed: Allergy & Precautions, NPO status , Patient's Chart, lab work & pertinent test results  History of Anesthesia Complications Negative for: history of anesthetic complications  Airway Mallampati: II  TM Distance: >3 FB Neck ROM: Full   Comment: Hx of cerv DJD Dental   Pulmonary neg pulmonary ROS,    breath sounds clear to auscultation       Cardiovascular hypertension, Pt. on medications  Rhythm:Regular Rate:Normal + Systolic murmurs    Neuro/Psych    GI/Hepatic Neg liver ROS, GERD  ,  Endo/Other  negative endocrine ROS  Renal/GU negative Renal ROS     Musculoskeletal negative musculoskeletal ROS (+)   Abdominal   Peds  Hematology negative hematology ROS (+)   Anesthesia Other Findings   Reproductive/Obstetrics                           Anesthesia Physical Anesthesia Plan  ASA: II  Anesthesia Plan: General   Post-op Pain Management:    Induction: Intravenous  Airway Management Planned: Oral ETT  Additional Equipment:   Intra-op Plan:   Post-operative Plan: Extubation in OR  Informed Consent: I have reviewed the patients History and Physical, chart, labs and discussed the procedure including the risks, benefits and alternatives for the proposed anesthesia with the patient or authorized representative who has indicated his/her understanding and acceptance.   Dental advisory given  Plan Discussed with: CRNA  Anesthesia Plan Comments:        Anesthesia Quick Evaluation

## 2015-08-16 NOTE — H&P (Signed)
57 yom who presents with ruq pain, burning epigastrium that occurred first years ago and was noted to have a stone. He was recommended to follow this. He recently has had abd pain in epigastrium and lower chest. he points to ruq also. he has some nausea and sweating with this also. feels better when he doesnt eat so he has not been eating as much. rolaids and tums he uses for nl heartburn dont really hel pthis. it eventually goes away on its onw. he has ruq [place] that shows a 9 mm stone. he is here to discuss possible cholecystectomy   Other Problems Elbert Ewings, CMA; 07/12/2015 4:19 PM) Bladder Problems Cancer Cholelithiasis High blood pressure Hypercholesterolemia Melanoma  Past Surgical History Elbert Ewings, CMA; 07/12/2015 4:19 PM) Cataract Surgery Left. Tonsillectomy  Diagnostic Studies History Elbert Ewings, [place]; 07/12/2015 4:19 PM) Colonoscopy never  Allergies Elbert Ewings, CMA; 07/12/2015 4:19 PM) No Known Drug Allergies09/20/2016  Medication History Elbert Ewings, CMA; 07/12/2015 4:20 PM) Lisinopril-Hydrochlorothiazide (20-25MG  Tablet, Oral) Active. Simvastatin (20MG  Tablet, Oral) Active. Aspirin (81MG  Tablet, Oral as needed) Active. Medications Reconciled  Social History Elbert Ewings, [place]; 07/12/2015 4:19 PM) Alcohol use Occasional alcohol use. Caffeine use Coffee. No drug use Tobacco use Never smoker.  Family History Elbert Ewings, [place]; 07/12/2015 4:19 PM) Family history unknown First Degree Relatives    Review of Systems Elbert Ewings CMA; 07/12/2015 4:19 PM) General Present- Chills, Fever and Night Sweats. Not Present- Appetite Loss, Fatigue, Weight Gain and Weight Loss. HEENT Present- Hearing Loss, Ringing in the Ears and Wears glasses/contact lenses. Not Present- Earache, Hoarseness, Nose Bleed, Oral Ulcers, Seasonal Allergies, Sinus Pain, Sore Throat, Visual Disturbances and Yellow Eyes. Respiratory Present- Wheezing. Not Present- Bloody sputum,  Chronic Cough, Difficulty Breathing and Snoring. Cardiovascular Present- Chest Pain, Difficulty Breathing Lying Down and Leg Cramps. Not Present- Palpitations, Rapid Heart Rate, Shortness of Breath and Swelling of Extremities. Gastrointestinal Present- Abdominal Pain, Excessive gas and Indigestion. Not Present- Bloating, Bloody Stool, Change in Bowel Habits, Chronic diarrhea, Constipation, Difficulty Swallowing, Gets full quickly at meals, Hemorrhoids, Nausea, Rectal Pain and Vomiting. Male Genitourinary Present- Frequency, Impotence, Nocturia and Painful Urination. Not Present- Blood in Urine, Change in Urinary Stream, Urgency and Urine Leakage.  Vitals Elbert Ewings CMA; 07/12/2015 4:20 PM) 07/12/2015 4:20 PM Weight: 183 lb Height: 73in Body Surface Area: 2.07 m Body Mass Index: 24.14 kg/m  Temp.: 97.45F(Temporal)  Pulse: 72 (Regular)  BP: 128/70 (Sitting, Left Arm, Standard)     Physical Exam Rolm Bookbinder MD; 07/13/2015 10:07 AM) General Mental Status-Alert. Orientation-Oriented X3.  Eye Sclera/Conjunctiva - Bilateral-No scleral icterus.  Chest and Lung Exam Chest and lung exam reveals -on auscultation, normal breath sounds, no adventitious sounds and normal vocal resonance.  Cardiovascular Cardiovascular examination reveals -normal heart sounds, regular rate and rhythm with no murmurs.  Abdomen Note: soft mild tender ruq bs normal     Assessment & Plan Rolm Bookbinder MD; 07/13/2015 10:07 AM) GALLSTONES (K80.20) Story: I discussed the procedure in detail. The patient was given Neurosurgeon. We discussed the risks and benefits of a laparoscopic cholecystectomy and possible cholangiogram including, but not limited to bleeding, infection, injury to surrounding structures such as the intestine or liver, bile leak, retained gallstones, need to convert to an open procedure, prolonged diarrhea, blood clots such as DVT, common bile duct injury,  anesthesia risks, and possible need for additional procedures. The likelihood of improvement in symptoms and return to the patient's normal status is good. We discussed the typical post-operative recovery course.

## 2015-08-16 NOTE — Anesthesia Procedure Notes (Signed)
Procedure Name: Intubation Date/Time: 08/16/2015 10:37 AM Performed by: Julian Reil Pre-anesthesia Checklist: Patient identified, Emergency Drugs available, Suction available and Patient being monitored Patient Re-evaluated:Patient Re-evaluated prior to inductionOxygen Delivery Method: Circle system utilized Preoxygenation: Pre-oxygenation with 100% oxygen Intubation Type: IV induction Ventilation: Mask ventilation without difficulty Laryngoscope Size: Mac and 4 Grade View: Grade II Tube type: Oral Tube size: 7.5 mm Number of attempts: 1 Airway Equipment and Method: Stylet Placement Confirmation: ETT inserted through vocal cords under direct vision,  positive ETCO2 and breath sounds checked- equal and bilateral Secured at: 22 cm Tube secured with: Tape Dental Injury: Teeth and Oropharynx as per pre-operative assessment

## 2015-08-16 NOTE — Transfer of Care (Signed)
Immediate Anesthesia Transfer of Care Note  Patient: Ryan Tyler  Procedure(s) Performed: Procedure(s): LAPAROSCOPIC CHOLECYSTECTOMY (N/A)  Patient Location: PACU  Anesthesia Type:General  Level of Consciousness: awake, alert , oriented and patient cooperative  Airway & Oxygen Therapy: Patient Spontanous Breathing and Patient connected to nasal cannula oxygen  Post-op Assessment: Report given to RN, Post -op Vital signs reviewed and stable and Patient moving all extremities  Post vital signs: Reviewed and stable  Last Vitals:  Filed Vitals:   08/16/15 0911  BP: 202/70  Pulse: 67  Temp: 36.2 C  Resp: 18    Complications: No apparent anesthesia complications

## 2015-08-17 ENCOUNTER — Encounter (HOSPITAL_COMMUNITY): Payer: Self-pay | Admitting: General Surgery

## 2015-08-17 DIAGNOSIS — K8010 Calculus of gallbladder with chronic cholecystitis without obstruction: Secondary | ICD-10-CM | POA: Diagnosis not present

## 2015-08-17 MED ORDER — OXYCODONE HCL 5 MG PO TABS: 5.0000 mg | ORAL_TABLET | Freq: Four times a day (QID) | ORAL | Status: DC | PRN

## 2015-08-17 NOTE — Discharge Summary (Signed)
Physician Discharge Summary  Patient ID: Ryan Tyler MRN: [id number] DOB/AGE: [birth year] 81 y.o.  Admit date: 08/16/2015 Discharge date: 08/17/2015  Admission Diagnoses: Biliary colic  Discharge Diagnoses:  Active Problems:   S/P laparoscopic cholecystectomy   Discharged Condition: good  Hospital Course: 81 yom admitted and underwent lap chole that was uneventful. He is voiding , tolerating diet and doing well with good pain control.  Discharge home today  Consults: None  Significant Diagnostic Studies: none  Treatments: surgery: lap chole  Discharge Exam: Blood pressure 149/88, pulse 64, temperature 98.4 F (36.9 C), temperature source Oral, resp. rate 18, height 6\' 2"  (1.88 m), weight 84.171 kg (185 lb 9 oz), SpO2 93 %. Incision/Wound:soft abdomen approp tender incisions clean  Disposition: 01-Home or Self Care     Medication List    TAKE these medications        aspirin 81 MG tablet  Take 81 mg by mouth daily.     lisinopril-hydrochlorothiazide 20-25 MG tablet  Commonly known as:  PRINZIDE,ZESTORETIC  Take 1 tablet by mouth every morning.     multivitamin tablet  Take 1 tablet by mouth daily.     oxyCODONE 5 MG immediate release tablet  Commonly known as:  Oxy IR/ROXICODONE  Take 1 tablet (5 mg total) by mouth every 6 (six) hours as needed for moderate pain, severe pain or breakthrough pain.     simvastatin 20 MG tablet  Commonly known as:  ZOCOR  Take 20 mg by mouth every evening.           Follow-up Information    Follow up with [facility], MD In 3 weeks.   Specialty:  General Surgery   Contact information:   [address] [place] [address] [phone]       Signed: Rolm Bookbinder 08/17/2015, 7:27 AM

## 2015-08-17 NOTE — Progress Notes (Signed)
Pt discharged to home.  Discharge instructions explained to pt.  Pt has no questions at the time of discharge.  Pt states he has all belongings.  IV removed.  Pt ambulated off unit on his own.   

## 2015-12-06 DIAGNOSIS — Z0000 Encounter for general adult medical examination without abnormal findings: Secondary | ICD-10-CM | POA: Diagnosis not present

## 2015-12-06 DIAGNOSIS — R351 Nocturia: Secondary | ICD-10-CM | POA: Diagnosis not present

## 2015-12-06 DIAGNOSIS — C672 Malignant neoplasm of lateral wall of bladder: Secondary | ICD-10-CM | POA: Diagnosis not present

## 2015-12-30 DIAGNOSIS — N390 Urinary tract infection, site not specified: Secondary | ICD-10-CM | POA: Diagnosis not present

## 2015-12-30 DIAGNOSIS — R3915 Urgency of urination: Secondary | ICD-10-CM | POA: Diagnosis not present

## 2015-12-30 DIAGNOSIS — N452 Orchitis: Secondary | ICD-10-CM | POA: Diagnosis not present

## 2015-12-30 DIAGNOSIS — Z0000 Encounter for general adult medical examination without abnormal findings: Secondary | ICD-10-CM | POA: Diagnosis not present

## 2015-12-30 DIAGNOSIS — R310 Gross hematuria: Secondary | ICD-10-CM | POA: Diagnosis not present

## 2016-01-13 DIAGNOSIS — Z0000 Encounter for general adult medical examination without abnormal findings: Secondary | ICD-10-CM | POA: Diagnosis not present

## 2016-01-13 DIAGNOSIS — N452 Orchitis: Secondary | ICD-10-CM | POA: Diagnosis not present

## 2016-07-19 DIAGNOSIS — Z79899 Other long term (current) drug therapy: Secondary | ICD-10-CM | POA: Diagnosis not present

## 2016-07-19 DIAGNOSIS — H9190 Unspecified hearing loss, unspecified ear: Secondary | ICD-10-CM | POA: Diagnosis not present

## 2016-07-19 DIAGNOSIS — Z1389 Encounter for screening for other disorder: Secondary | ICD-10-CM | POA: Diagnosis not present

## 2016-07-19 DIAGNOSIS — N400 Enlarged prostate without lower urinary tract symptoms: Secondary | ICD-10-CM | POA: Diagnosis not present

## 2016-07-19 DIAGNOSIS — J309 Allergic rhinitis, unspecified: Secondary | ICD-10-CM | POA: Diagnosis not present

## 2016-07-19 DIAGNOSIS — I10 Essential (primary) hypertension: Secondary | ICD-10-CM | POA: Diagnosis not present

## 2016-07-19 DIAGNOSIS — K4090 Unilateral inguinal hernia, without obstruction or gangrene, not specified as recurrent: Secondary | ICD-10-CM | POA: Diagnosis not present

## 2016-07-19 DIAGNOSIS — Z0001 Encounter for general adult medical examination with abnormal findings: Secondary | ICD-10-CM | POA: Diagnosis not present

## 2016-07-19 DIAGNOSIS — R7303 Prediabetes: Secondary | ICD-10-CM | POA: Diagnosis not present

## 2016-07-19 DIAGNOSIS — M1990 Unspecified osteoarthritis, unspecified site: Secondary | ICD-10-CM | POA: Diagnosis not present

## 2016-10-03 DIAGNOSIS — B009 Herpesviral infection, unspecified: Secondary | ICD-10-CM | POA: Diagnosis not present

## 2016-10-05 ENCOUNTER — Other Ambulatory Visit: Payer: Self-pay | Admitting: Internal Medicine

## 2016-10-05 ENCOUNTER — Ambulatory Visit
Admission: RE | Admit: 2016-10-05 | Discharge: 2016-10-05 | Disposition: A | Discharge status: Home / Self Care | Payer: Medicare Other | Source: Ambulatory Visit | Attending: Internal Medicine | Admitting: Internal Medicine

## 2016-10-05 DIAGNOSIS — R079 Chest pain, unspecified: Secondary | ICD-10-CM

## 2016-10-05 DIAGNOSIS — R0781 Pleurodynia: Secondary | ICD-10-CM | POA: Diagnosis not present

## 2016-11-18 ENCOUNTER — Emergency Department (HOSPITAL_COMMUNITY)
Admission: EM | Admit: 2016-11-18 | Discharge: 2016-11-18 | Disposition: A | Discharge status: Home / Self Care | Payer: Medicare Other | Attending: Emergency Medicine | Admitting: Emergency Medicine

## 2016-11-18 ENCOUNTER — Encounter (HOSPITAL_COMMUNITY): Payer: Self-pay | Admitting: *Deleted

## 2016-11-18 ENCOUNTER — Emergency Department (HOSPITAL_COMMUNITY): Payer: Medicare Other

## 2016-11-18 DIAGNOSIS — R339 Retention of urine, unspecified: Secondary | ICD-10-CM | POA: Insufficient documentation

## 2016-11-18 DIAGNOSIS — N200 Calculus of kidney: Secondary | ICD-10-CM | POA: Diagnosis not present

## 2016-11-18 DIAGNOSIS — Z79899 Other long term (current) drug therapy: Secondary | ICD-10-CM | POA: Diagnosis not present

## 2016-11-18 DIAGNOSIS — N329 Bladder disorder, unspecified: Secondary | ICD-10-CM | POA: Diagnosis not present

## 2016-11-18 DIAGNOSIS — Z8551 Personal history of malignant neoplasm of bladder: Secondary | ICD-10-CM | POA: Diagnosis not present

## 2016-11-18 DIAGNOSIS — N3289 Other specified disorders of bladder: Secondary | ICD-10-CM

## 2016-11-18 DIAGNOSIS — Z7982 Long term (current) use of aspirin: Secondary | ICD-10-CM | POA: Insufficient documentation

## 2016-11-18 DIAGNOSIS — I10 Essential (primary) hypertension: Secondary | ICD-10-CM | POA: Insufficient documentation

## 2016-11-18 LAB — URINALYSIS, ROUTINE W REFLEX MICROSCOPIC
Bilirubin Urine: NEGATIVE
Glucose, UA: NEGATIVE mg/dL
Hgb urine dipstick: NEGATIVE
Ketones, ur: NEGATIVE mg/dL
Leukocytes, UA: NEGATIVE
Nitrite: NEGATIVE
Protein, ur: NEGATIVE mg/dL
Specific Gravity, Urine: 1.014 (ref 1.005–1.030)
pH: 5 (ref 5.0–8.0)

## 2016-11-18 LAB — CBC
HCT: 44.6 % (ref 39.0–52.0)
Hemoglobin: 15 g/dL (ref 13.0–17.0)
MCH: 31.4 pg (ref 26.0–34.0)
MCHC: 33.6 g/dL (ref 30.0–36.0)
MCV: 93.5 fL (ref 78.0–100.0)
Platelets: 192 10*3/uL (ref 150–400)
RBC: 4.77 MIL/uL (ref 4.22–5.81)
RDW: 13.6 % (ref 11.5–15.5)
WBC: 9.9 10*3/uL (ref 4.0–10.5)

## 2016-11-18 LAB — BASIC METABOLIC PANEL
Anion gap: 9 (ref 5–15)
BUN: 12 mg/dL (ref 6–20)
CO2: 26 mmol/L (ref 22–32)
Calcium: 9.3 mg/dL (ref 8.9–10.3)
Chloride: 104 mmol/L (ref 101–111)
Creatinine, Ser: 0.94 mg/dL (ref 0.61–1.24)
GFR calc Af Amer: >60 mL/min (ref >60)
GFR calc non Af Amer: >60 mL/min (ref >60)
Glucose, Bld: 101 mg/dL — ABNORMAL HIGH (ref 65–99)
Potassium: 4.2 mmol/L (ref 3.5–5.1)
Sodium: 139 mmol/L (ref 135–145)

## 2016-11-18 NOTE — ED Triage Notes (Addendum)
Pt reports hx of prostate cancer and surgery. Reports not feeling well x 2 weeks and having urinary retention, unable to urinate since last night. Feels like abd is distended.

## 2016-11-18 NOTE — ED Provider Notes (Addendum)
[facility] DEPT Provider Note   CSN: [id number] Arrival date & time: 11/18/16  1421     History   Chief Complaint Chief Complaint  Patient presents with  . Urinary Retention    HPI Ryan Tyler is a 83 y.o. male.  HPI Patient presents to the emergency department complains of urinary retention and inability urinate since last night.  He also feels like his abdomen is distended.  Reports of decreased generalized energy over the past 2 weeks.  He does have a history of prior prostate cancer and prior surgery by Dr. Karsten Ro of [facility].    Past Medical History:  Diagnosis Date  . Asymptomatic cholelithiasis   . Bladder tumor   . BPH (benign prostatic hypertrophy)   . Cancer ([place])    bladder  . GERD (gastroesophageal reflux disease)   . Hearing loss   . HOH (hard of hearing)    no hearing aids  . Hyperlipidemia   . Hypertension   . Kidney stones   . Mild left inguinal hernia   . Nocturia   . Urgency of urination     Patient Active Problem List   Diagnosis Date Noted  . S/P laparoscopic cholecystectomy 08/16/2015    Past Surgical History:  Procedure Laterality Date  . CATARACT EXTRACTION W/ INTRAOCULAR LENS IMPLANT Left nov  2014  . CHOLECYSTECTOMY N/A 08/16/2015   Procedure: LAPAROSCOPIC CHOLECYSTECTOMY;  Surgeon: Rolm Bookbinder, MD;  Location: [facility];  Service: General;  Laterality: N/A;  . TONSILLECTOMY    . TRANSURETHRAL RESECTION OF BLADDER TUMOR WITH GYRUS (TURBT-GYRUS) N/A 04/05/2014   Procedure: TRANSURETHRAL RESECTION OF BLADDER TUMOR WITH GYRUS  AND INTRAVESICO CHEMO;  Surgeon: Claybon Jabs, MD;  Location: [facility];  Service: Urology;  Laterality: N/A;  . [place] Medications    Prior to Admission medications   Medication Sig Start Date End Date Taking? Authorizing Provider  aspirin 81 MG tablet Take 81 mg by mouth daily.      Historical Provider, MD    lisinopril-hydrochlorothiazide (PRINZIDE,ZESTORETIC) 20-25 MG per tablet Take 1 tablet by mouth every morning.     Historical Provider, MD  Multiple Vitamin (MULTIVITAMIN) tablet Take 1 tablet by mouth daily.    Historical Provider, MD  oxyCODONE (OXY IR/ROXICODONE) 5 MG immediate release tablet Take 1 tablet (5 mg total) by mouth every 6 (six) hours as needed for moderate pain, severe pain or breakthrough pain. 08/17/15   Rolm Bookbinder, MD  simvastatin (ZOCOR) 20 MG tablet Take 20 mg by mouth every evening.    Historical Provider, MD    Family History Family History  Problem Relation Age of Onset  . Cancer Mother   . Leukemia Other     Social History Social History  Substance Use Topics  . Smoking status: Never Smoker  . Smokeless tobacco: Former Systems developer    Types: Chew    Quit date: 03/31/2009  . Alcohol use Yes     Comment: occasional     Allergies   Patient has no known allergies.   Review of Systems Review of Systems   Physical Exam Updated Vital Signs BP 150/84   Pulse 79   Temp 97.9 F (36.6 C) (Oral)   Resp 18   SpO2 96%   Physical Exam  Constitutional: He is oriented to person, place, and time. He appears well-developed and well-nourished.  HENT:  Head: Normocephalic and atraumatic.  Eyes: EOM  are normal.  Neck: Normal range of motion.  Cardiovascular: Normal rate, regular rhythm, normal heart sounds and intact distal pulses.   Pulmonary/Chest: Effort normal and breath sounds normal. No respiratory distress.  Abdominal: Soft. He exhibits no distension. There is no tenderness.  Musculoskeletal: Normal range of motion.  Neurological: He is alert and oriented to person, place, and time.  Skin: Skin is warm and dry.  Psychiatric: He has a normal mood and affect. Judgment normal.  Nursing note and vitals reviewed.    ED Treatments / Results  Labs (all labs ordered are listed, but only abnormal results are displayed) Labs Reviewed  BASIC METABOLIC  PANEL - Abnormal; Notable for the following:       Result Value   Glucose, Bld 101 (*)    All other components within normal limits  URINALYSIS, ROUTINE W REFLEX MICROSCOPIC  CBC    EKG  EKG Interpretation None       Radiology Ct Renal Stone Study  Result Date: 11/18/2016 CLINICAL DATA:  History of prostate cancer post transurethral resection. Also previous transurethral resection of bladder tumor. Unable to urinate since yesterday. Bloating feeling lower abdomen. Abdominal growth seen on bladder scan. EXAM: CT ABDOMEN AND PELVIS WITHOUT CONTRAST TECHNIQUE: Multidetector CT imaging of the abdomen and pelvis was performed following the standard protocol without IV contrast. COMPARISON:  03/04/2014 and 05/21/2011 FINDINGS: Lower chest: Minimal chronic interstitial prominence over the the posterior right lower lobe. New patchy airspace density over the left lower lobe. Mild calcified plaque over the left main, left anterior descending and right coronary arteries. Mild calcified plaque over the descending thoracic aorta. Hepatobiliary: Previous cholecystectomy. Liver and biliary tree are normal. Pancreas: Within normal. Spleen: Within normal. Adrenals/Urinary Tract: Adrenal glands are normal. Kidneys are normal in size and demonstrate left-sided nephrolithiasis with the largest stone measuring 9 mm over the upper pole. Several small left renal cysts unchanged. Ureters are normal. There is a well-defined oval soft tissue density over the midline bladder base at the trigone measuring 3.5 x 4 cm unchanged from 2012. This could be causing a degree of bladder outlet obstruction. A previously noted similar density along the right posterior aspect of the bladder is no longer visualized. Stomach/Bowel: Stomach and small bowel are normal. Appendix is normal. There is moderate diverticulosis of the colon most prominent over the sigmoid colon. Vascular/Lymphatic: Moderate calcified plaque over the abdominal aorta  and iliac arteries. Remaining vascular structures are unremarkable. No significant adenopathy. Reproductive: Previous prostatectomy. Other: Moderate size left inguinal hernia containing only peritoneal fat unchanged. No free fluid or focal inflammatory change. Musculoskeletal: Degenerate change of the spine and hips. Stable chronic appearance of the sacrum with increased lucency and thickened trabeculae. IMPRESSION: Postsurgical change compatible with previous trans urethral resection of bladder tumor and prostate. There is a well-defined oval soft tissue masslike density over the midline bladder base near the trigone unchanged from 2012 as this could be causing a degree of bladder outlet obstruction. Recommend clinical correlation. Patchy airspace process over the left lower lobe which may be due to pneumonia. Recommend follow-up CT 4-6 weeks. Left-sided nephrolithiasis unchanged.  Left renal cysts. Diverticulosis of the colon. Large left inguinal hernia containing only peritoneal fat unchanged. Atherosclerotic coronary artery disease.  Aortic atherosclerosis. Electronically Signed   By: Marin Olp M.D.   On: 11/18/2016 17:31    Procedures Procedures (including critical care time)  Medications Ordered in ED Medications - No data to display   Initial Impression / Assessment and  Plan / ED Course  I have reviewed the triage vital signs and the nursing notes.  Pertinent labs & imaging results that were available during my care of the patient were reviewed by me and considered in my medical decision making (see chart for details).     5:44 PM Patient feels better this time.  He is now able to urinate.  He just urinated 150 cc and feels much better.  There is a bladder mass noted on CT as well as bedside ultrasound.  There does not seem to be significant change in this since 2012 per radiology.  He'll still need outpatient urology follow-up and possible cystoscopy to take a look at this.  I discussed  the options of catheter no catheter with the family.  At this time though prefer not to have a catheter since he seems to be urinating more freely at this time.  Patient family understand to return the ER for new or worsening symptoms.  Instructed to follow-up closely with urology.  In regards to possible patchy airspace process over the left lower lobe he does not have cough or shortness of breath.  Doubt pneumonia  Final Clinical Impressions(s) / ED Diagnoses   Final diagnoses:  Urinary retention  Bladder mass    New Prescriptions New Prescriptions   No medications on file     Jola Schmidt, MD 11/18/16 [place], MD 12/05/16 [phone]

## 2016-11-20 DIAGNOSIS — R338 Other retention of urine: Secondary | ICD-10-CM | POA: Diagnosis not present

## 2016-11-20 DIAGNOSIS — Z8551 Personal history of malignant neoplasm of bladder: Secondary | ICD-10-CM | POA: Diagnosis not present

## 2017-02-13 ENCOUNTER — Emergency Department (HOSPITAL_COMMUNITY): Payer: Medicare Other

## 2017-02-13 ENCOUNTER — Emergency Department (HOSPITAL_COMMUNITY)
Admission: EM | Admit: 2017-02-13 | Discharge: 2017-02-14 | Disposition: A | Discharge status: Home / Self Care | Payer: Medicare Other | Attending: Emergency Medicine | Admitting: Emergency Medicine

## 2017-02-13 ENCOUNTER — Encounter (HOSPITAL_COMMUNITY): Payer: Self-pay | Admitting: Emergency Medicine

## 2017-02-13 DIAGNOSIS — Y999 Unspecified external cause status: Secondary | ICD-10-CM | POA: Diagnosis not present

## 2017-02-13 DIAGNOSIS — S0990XA Unspecified injury of head, initial encounter: Secondary | ICD-10-CM | POA: Insufficient documentation

## 2017-02-13 DIAGNOSIS — Z79899 Other long term (current) drug therapy: Secondary | ICD-10-CM | POA: Diagnosis not present

## 2017-02-13 DIAGNOSIS — M549 Dorsalgia, unspecified: Secondary | ICD-10-CM | POA: Diagnosis not present

## 2017-02-13 DIAGNOSIS — R1030 Lower abdominal pain, unspecified: Secondary | ICD-10-CM | POA: Diagnosis not present

## 2017-02-13 DIAGNOSIS — S41112A Laceration without foreign body of left upper arm, initial encounter: Secondary | ICD-10-CM | POA: Diagnosis not present

## 2017-02-13 DIAGNOSIS — M542 Cervicalgia: Secondary | ICD-10-CM | POA: Diagnosis not present

## 2017-02-13 DIAGNOSIS — Z7982 Long term (current) use of aspirin: Secondary | ICD-10-CM | POA: Insufficient documentation

## 2017-02-13 DIAGNOSIS — Y92410 Unspecified street and highway as the place of occurrence of the external cause: Secondary | ICD-10-CM | POA: Insufficient documentation

## 2017-02-13 DIAGNOSIS — M79602 Pain in left arm: Secondary | ICD-10-CM | POA: Diagnosis not present

## 2017-02-13 DIAGNOSIS — M25532 Pain in left wrist: Secondary | ICD-10-CM | POA: Diagnosis not present

## 2017-02-13 DIAGNOSIS — Y9389 Activity, other specified: Secondary | ICD-10-CM | POA: Insufficient documentation

## 2017-02-13 DIAGNOSIS — Z8551 Personal history of malignant neoplasm of bladder: Secondary | ICD-10-CM | POA: Diagnosis not present

## 2017-02-13 DIAGNOSIS — I10 Essential (primary) hypertension: Secondary | ICD-10-CM | POA: Diagnosis not present

## 2017-02-13 DIAGNOSIS — S3992XA Unspecified injury of lower back, initial encounter: Secondary | ICD-10-CM | POA: Diagnosis not present

## 2017-02-13 DIAGNOSIS — M79642 Pain in left hand: Secondary | ICD-10-CM | POA: Diagnosis not present

## 2017-02-13 DIAGNOSIS — S299XXA Unspecified injury of thorax, initial encounter: Secondary | ICD-10-CM | POA: Diagnosis not present

## 2017-02-13 DIAGNOSIS — S4992XA Unspecified injury of left shoulder and upper arm, initial encounter: Secondary | ICD-10-CM | POA: Diagnosis not present

## 2017-02-13 DIAGNOSIS — R079 Chest pain, unspecified: Secondary | ICD-10-CM | POA: Diagnosis not present

## 2017-02-13 DIAGNOSIS — S199XXA Unspecified injury of neck, initial encounter: Secondary | ICD-10-CM | POA: Diagnosis not present

## 2017-02-13 DIAGNOSIS — S6992XA Unspecified injury of left wrist, hand and finger(s), initial encounter: Secondary | ICD-10-CM | POA: Diagnosis not present

## 2017-02-13 DIAGNOSIS — S3991XA Unspecified injury of abdomen, initial encounter: Secondary | ICD-10-CM | POA: Diagnosis not present

## 2017-02-13 DIAGNOSIS — R109 Unspecified abdominal pain: Secondary | ICD-10-CM | POA: Diagnosis not present

## 2017-02-13 DIAGNOSIS — R51 Headache: Secondary | ICD-10-CM | POA: Diagnosis not present

## 2017-02-13 DIAGNOSIS — R0781 Pleurodynia: Secondary | ICD-10-CM | POA: Diagnosis not present

## 2017-02-13 LAB — CBC
HCT: 46.8 % (ref 39.0–52.0)
Hemoglobin: 15.2 g/dL (ref 13.0–17.0)
MCH: 31.5 pg (ref 26.0–34.0)
MCHC: 32.5 g/dL (ref 30.0–36.0)
MCV: 96.9 fL (ref 78.0–100.0)
Platelets: 195 10*3/uL (ref 150–400)
RBC: 4.83 MIL/uL (ref 4.22–5.81)
RDW: 14.3 % (ref 11.5–15.5)
WBC: 8.8 10*3/uL (ref 4.0–10.5)

## 2017-02-13 LAB — I-STAT TROPONIN, ED: Troponin i, poc: 0 ng/mL (ref 0.00–0.08)

## 2017-02-13 LAB — BASIC METABOLIC PANEL
Anion gap: 13 (ref 5–15)
BUN: 13 mg/dL (ref 6–20)
CO2: 25 mmol/L (ref 22–32)
Calcium: 9.5 mg/dL (ref 8.9–10.3)
Chloride: 100 mmol/L — ABNORMAL LOW (ref 101–111)
Creatinine, Ser: 1.35 mg/dL — ABNORMAL HIGH (ref 0.61–1.24)
GFR calc Af Amer: 53 mL/min — ABNORMAL LOW (ref >60)
GFR calc non Af Amer: 46 mL/min — ABNORMAL LOW (ref >60)
Glucose, Bld: 107 mg/dL — ABNORMAL HIGH (ref 65–99)
Potassium: 4.5 mmol/L (ref 3.5–5.1)
Sodium: 138 mmol/L (ref 135–145)

## 2017-02-13 MED ORDER — IOPAMIDOL (ISOVUE-300) INJECTION 61%
INTRAVENOUS | Status: AC
  Administered 2017-02-13: 100 mL
  Filled 2017-02-13: qty 100

## 2017-02-13 MED ORDER — MORPHINE SULFATE (PF) 4 MG/ML IV SOLN
2.0000 mg | Freq: Once | INTRAVENOUS | Status: AC
  Administered 2017-02-13: 2 mg via INTRAVENOUS
  Filled 2017-02-13: qty 1
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
## 2017-02-14 MED ORDER — TETANUS-DIPHTH-ACELL PERTUSSIS 5-2.5-18.5 LF-MCG/0.5 IM SUSP
0.5000 mL | Freq: Once | INTRAMUSCULAR | Status: AC
  Administered 2017-02-14: 0.5 mL via INTRAMUSCULAR
  Filled 2017-02-14: qty 0.5

## 2017-02-14 MED ORDER — HYDROCODONE-ACETAMINOPHEN 5-325 MG PO TABS
2.0000 | ORAL_TABLET | Freq: Once | ORAL | Status: AC
  Administered 2017-02-14: 2 via ORAL
  Filled 2017-02-14: qty 2

## 2017-02-14 NOTE — Discharge Instructions (Signed)
You can take Tylenol as prescribed over-the-counter as needed for your pain. For the first 2-3 days, use ice on your sore muscles alternating 20 minutes on, 20 minutes off. After the third day, you can use heat in the same manner. Please follow-up with your primary care provider as soon as possible for follow-up of today's visit. Please return to the emergency department if you develop any new or worsening symptoms.

## 2017-02-18 DIAGNOSIS — H918X3 Other specified hearing loss, bilateral: Secondary | ICD-10-CM | POA: Diagnosis not present

## 2017-02-18 DIAGNOSIS — S41102A Unspecified open wound of left upper arm, initial encounter: Secondary | ICD-10-CM | POA: Diagnosis not present

## 2017-02-18 DIAGNOSIS — S060X9A Concussion with loss of consciousness of unspecified duration, initial encounter: Secondary | ICD-10-CM | POA: Diagnosis not present

## 2017-04-15 ENCOUNTER — Emergency Department (HOSPITAL_COMMUNITY): Payer: Medicare Other

## 2017-04-15 ENCOUNTER — Encounter (HOSPITAL_COMMUNITY): Payer: Self-pay

## 2017-04-15 ENCOUNTER — Emergency Department (HOSPITAL_COMMUNITY)
Admission: EM | Admit: 2017-04-15 | Discharge: 2017-04-16 | Disposition: A | Discharge status: Home / Self Care | Payer: Medicare Other | Attending: Emergency Medicine | Admitting: Emergency Medicine

## 2017-04-15 DIAGNOSIS — S0990XA Unspecified injury of head, initial encounter: Secondary | ICD-10-CM | POA: Diagnosis not present

## 2017-04-15 DIAGNOSIS — Y939 Activity, unspecified: Secondary | ICD-10-CM | POA: Insufficient documentation

## 2017-04-15 DIAGNOSIS — Y929 Unspecified place or not applicable: Secondary | ICD-10-CM | POA: Insufficient documentation

## 2017-04-15 DIAGNOSIS — R51 Headache: Secondary | ICD-10-CM | POA: Diagnosis not present

## 2017-04-15 DIAGNOSIS — M542 Cervicalgia: Secondary | ICD-10-CM | POA: Diagnosis not present

## 2017-04-15 DIAGNOSIS — Y999 Unspecified external cause status: Secondary | ICD-10-CM | POA: Insufficient documentation

## 2017-04-15 DIAGNOSIS — W010XXA Fall on same level from slipping, tripping and stumbling without subsequent striking against object, initial encounter: Secondary | ICD-10-CM | POA: Insufficient documentation

## 2017-04-15 DIAGNOSIS — Z5321 Procedure and treatment not carried out due to patient leaving prior to being seen by health care provider: Secondary | ICD-10-CM | POA: Diagnosis not present

## 2017-04-15 DIAGNOSIS — S0993XA Unspecified injury of face, initial encounter: Secondary | ICD-10-CM | POA: Diagnosis present

## 2017-04-15 DIAGNOSIS — S199XXA Unspecified injury of neck, initial encounter: Secondary | ICD-10-CM | POA: Diagnosis not present

## 2017-04-15 DIAGNOSIS — S0081XA Abrasion of other part of head, initial encounter: Secondary | ICD-10-CM | POA: Diagnosis not present

## 2017-04-15 NOTE — ED Triage Notes (Signed)
Pt with PMHx of dementia, brought in by his son with c/o pt tripping and falling while on his evening walk. His son states he tripped over a root "the same root he has tripped over 3 times." pt has abrasion to left side of his forehead and to his nose. Hx takes aspirin. Pt denies being in any pain but has dementia. He is moving his right hand as if it were bothering him, per son and he does have a small cut on his right hand. Pt is alert to self, place and situation but answered the year incorrectly. No LOC.

## 2017-04-15 NOTE — ED Notes (Signed)
Pts son at bedside in triage room 2 sitting with patient. Pt appears calm and in NAD, smiling and interacting appropriately with staff. Pts son opened door to the room and asked for update.This RN explained to patient and son how the acuity and triage processes works in the ER and that we are working diligently to get the patient in the back to see a doctor. pts son verbalized understanding, smiled and thanked this RN for taking care of his father. As soon as pts son closed the triage room door, this RN and [place], EMT overheard the son telling the patient "they dont give a damn about you. They dont care about you. They arent doing anything for you." This RN opened the door and politely and calmly told the patient "Just to let you know, sir, that we DO care about you and are just waiting for a room in the back." pt smiled and stated "okay."

## 2017-04-15 NOTE — ED Notes (Addendum)
At 2021, One large bed bug found crawling on patient. Bed bug placed in specimen container and pt placed on contact precautions. Pt changed into blue paper scrubs and belongings double bagged. Son at bedside. A smaller bed bug was then found on patients son and patients son grabbed it, squahsed it between his fingers and dropped it to the floor while this RN was attempting to locate the specimen cup to capture the bedbug. Additionally, when changing patient into paper scrubs, this RN noted the patient buttocks to appear red, non-blanchable but no open wounds noted.

## 2017-04-15 NOTE — ED Notes (Addendum)
During triage, the patients son was present and firmly told this RN "you talk to me, not him. I am the healthcare power of attorney." This RN stated okay. This RN then asked if patient was having any pain and patient stated no. Son interjected and stated "yes he is. He just wont tell you."

## 2017-04-15 NOTE — ED Notes (Signed)
This RN was in another room triaging another patient when this RN observed patient and his son walking out of the triage door into waiting room. This RN asked "oh, are yall leaving?" and patients son loudly stated, "yeah because he is getting piss poor healthcare. No one has checked on my father in hours! yall have been walking around and talking to each other and laughing! I have it all on my phone." this RN then informed patient that the reason he has been held in the triage room with the curtain open is so staff can keep an eye on the patient and that we have been keeping an eye on him and even had ordered a CT scan. This RN then told son that it is illegal in the state of [place] to have recordings in the hospital and the police would need to be involved if he did not delete it. 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 Wells Guiles, Philip RN made aware of situation.

## 2017-04-15 NOTE — ED Notes (Signed)
Pt leaving cause son is complaining that no one is taking care of him and stated "that no one gives a shit about you and isn't doing anything" We reassured pt and son several times that we are waiting for a room and that it's a busy night. Explained to pt that we did blood work, head ct, vitals etc but pt's son stated "that's not good enough we are paying thousands of dollars on health insurance for them to do nothing for you and not take care of you" Pt and son exited triage room to go home. Melanie RN followed them out to lobby and asked if they are leaving and son started yelling and screaming at [employer]. Security called

## 2017-04-18 DIAGNOSIS — S41102A Unspecified open wound of left upper arm, initial encounter: Secondary | ICD-10-CM | POA: Diagnosis not present

## 2017-04-18 DIAGNOSIS — R2681 Unsteadiness on feet: Secondary | ICD-10-CM | POA: Diagnosis not present

## 2017-04-18 DIAGNOSIS — S060X9A Concussion with loss of consciousness of unspecified duration, initial encounter: Secondary | ICD-10-CM | POA: Diagnosis not present

## 2017-04-18 DIAGNOSIS — H918X3 Other specified hearing loss, bilateral: Secondary | ICD-10-CM | POA: Diagnosis not present

## 2017-05-04 ENCOUNTER — Emergency Department (HOSPITAL_COMMUNITY): Payer: Medicare Other

## 2017-05-04 ENCOUNTER — Encounter (HOSPITAL_COMMUNITY): Payer: Self-pay

## 2017-05-04 ENCOUNTER — Emergency Department (HOSPITAL_COMMUNITY)
Admission: EM | Admit: 2017-05-04 | Discharge: 2017-05-04 | Disposition: A | Discharge status: Home / Self Care | Payer: Medicare Other | Attending: Emergency Medicine | Admitting: Emergency Medicine

## 2017-05-04 DIAGNOSIS — S51812A Laceration without foreign body of left forearm, initial encounter: Secondary | ICD-10-CM | POA: Diagnosis not present

## 2017-05-04 DIAGNOSIS — S51811A Laceration without foreign body of right forearm, initial encounter: Secondary | ICD-10-CM | POA: Insufficient documentation

## 2017-05-04 DIAGNOSIS — S59912A Unspecified injury of left forearm, initial encounter: Secondary | ICD-10-CM | POA: Diagnosis present

## 2017-05-04 DIAGNOSIS — W19XXXA Unspecified fall, initial encounter: Secondary | ICD-10-CM

## 2017-05-04 DIAGNOSIS — R296 Repeated falls: Secondary | ICD-10-CM | POA: Diagnosis not present

## 2017-05-04 DIAGNOSIS — T148XXA Other injury of unspecified body region, initial encounter: Secondary | ICD-10-CM

## 2017-05-04 DIAGNOSIS — S40812A Abrasion of left upper arm, initial encounter: Secondary | ICD-10-CM | POA: Diagnosis not present

## 2017-05-04 DIAGNOSIS — Z7982 Long term (current) use of aspirin: Secondary | ICD-10-CM | POA: Insufficient documentation

## 2017-05-04 DIAGNOSIS — S5012XA Contusion of left forearm, initial encounter: Secondary | ICD-10-CM | POA: Insufficient documentation

## 2017-05-04 DIAGNOSIS — S61402A Unspecified open wound of left hand, initial encounter: Secondary | ICD-10-CM | POA: Diagnosis not present

## 2017-05-04 DIAGNOSIS — W010XXA Fall on same level from slipping, tripping and stumbling without subsequent striking against object, initial encounter: Secondary | ICD-10-CM | POA: Insufficient documentation

## 2017-05-04 DIAGNOSIS — S40811A Abrasion of right upper arm, initial encounter: Secondary | ICD-10-CM | POA: Diagnosis not present

## 2017-05-04 DIAGNOSIS — Z9049 Acquired absence of other specified parts of digestive tract: Secondary | ICD-10-CM | POA: Insufficient documentation

## 2017-05-04 DIAGNOSIS — F0390 Unspecified dementia without behavioral disturbance: Secondary | ICD-10-CM | POA: Diagnosis not present

## 2017-05-04 DIAGNOSIS — S61401A Unspecified open wound of right hand, initial encounter: Secondary | ICD-10-CM | POA: Diagnosis not present

## 2017-05-04 DIAGNOSIS — I10 Essential (primary) hypertension: Secondary | ICD-10-CM | POA: Insufficient documentation

## 2017-05-04 DIAGNOSIS — Z8551 Personal history of malignant neoplasm of bladder: Secondary | ICD-10-CM | POA: Diagnosis not present

## 2017-05-04 DIAGNOSIS — Z79899 Other long term (current) drug therapy: Secondary | ICD-10-CM | POA: Diagnosis not present

## 2017-05-04 DIAGNOSIS — Y9389 Activity, other specified: Secondary | ICD-10-CM | POA: Diagnosis not present

## 2017-05-04 DIAGNOSIS — Y929 Unspecified place or not applicable: Secondary | ICD-10-CM | POA: Diagnosis not present

## 2017-05-04 DIAGNOSIS — M25522 Pain in left elbow: Secondary | ICD-10-CM | POA: Diagnosis not present

## 2017-05-04 DIAGNOSIS — Y998 Other external cause status: Secondary | ICD-10-CM | POA: Diagnosis not present

## 2017-05-04 DIAGNOSIS — S59902A Unspecified injury of left elbow, initial encounter: Secondary | ICD-10-CM | POA: Diagnosis not present
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
## 2017-05-04 NOTE — Discharge Instructions (Signed)
As discussed, your evaluation today has been largely reassuring.  But, it is important that you monitor your condition carefully, and do not hesitate to return to the ED if you develop new, or concerning changes in your condition. ? ?Otherwise, please follow-up with your physician for appropriate ongoing care. ? ?

## 2017-05-04 NOTE — ED Provider Notes (Signed)
[name]   CSN: [id number] Arrival date & time: 05/04/17  1608     History   Chief Complaint Chief Complaint  Patient presents with  . Fall    HPI Ryan Tyler is a 83 y.o. male.  HPI  Patient presents with his son who provides many of the details of the history of present illness. Patient has dementia, level V caveat. Per report the patient has frequent falls. Today the patient had a witnessed fall, falling onto his arms. Patient himself denies pain anywhere other than the left elbow, left forearm. Son reports patient fell onto his arms, striking both forearms, sustaining multiple wounds to both forearms. No report of head trauma, and currently the patient denies head pain, neck pain, face pain, dizziness, confusion, nausea. Patient lives alone, and the son states that he has worked with social work in an attempt to obtain additional resources given the patient's dementia.  Past Medical History:  Diagnosis Date  . Asymptomatic cholelithiasis   . Bladder tumor   . BPH (benign prostatic hypertrophy)   . Cancer ([place])    bladder  . GERD (gastroesophageal reflux disease)   . Hearing loss   . HOH (hard of hearing)    no hearing aids  . Hyperlipidemia   . Hypertension   . Kidney stones   . Mild left inguinal hernia   . Nocturia   . Urgency of urination     Patient Active Problem List   Diagnosis Date Noted  . S/P laparoscopic cholecystectomy 08/16/2015    Past Surgical History:  Procedure Laterality Date  . CATARACT EXTRACTION W/ INTRAOCULAR LENS IMPLANT Left nov  2014  . CHOLECYSTECTOMY N/A 08/16/2015   Procedure: LAPAROSCOPIC CHOLECYSTECTOMY;  Surgeon: Rolm Bookbinder, MD;  Location: [facility];  Service: General;  Laterality: N/A;  . TONSILLECTOMY    . TRANSURETHRAL RESECTION OF BLADDER TUMOR WITH GYRUS (TURBT-GYRUS) N/A 04/05/2014   Procedure: TRANSURETHRAL RESECTION OF BLADDER TUMOR WITH GYRUS  AND INTRAVESICO CHEMO;  Surgeon: Claybon Jabs, MD;  Location: [facility];  Service: Urology;  Laterality: N/A;  . [place] Medications    Prior to Admission medications   Medication Sig Start Date End Date Taking? Authorizing Provider  aspirin EC 81 MG tablet Take 81 mg by mouth daily.    [provider]  lisinopril-hydrochlorothiazide (PRINZIDE,ZESTORETIC) 20-25 MG per tablet Take 1 tablet by mouth daily.     [provider]  naproxen sodium (ALEVE) 220 MG tablet Take 220 mg by mouth daily as needed (pain).    [provider]  simvastatin (ZOCOR) 20 MG tablet Take 20 mg by mouth daily with supper.     [provider]  Tetrahydrozoline HCl (VISINE OP) Place 1 drop into both eyes daily as needed (dry eyes/ irritation).    [provider]    Family History Family History  Problem Relation Age of Onset  . Cancer Mother   . Leukemia Other     Social History Social History  Substance Use Topics  . Smoking status: Never Smoker  . Smokeless tobacco: Former Systems developer    Types: Chew    Quit date: 03/31/2009  . Alcohol use Yes     Comment: occasional     Allergies   Patient has no known allergies.   Review of Systems Review of Systems  Unable to perform ROS: Dementia     Physical Exam  Updated Vital Signs BP 107/68 (BP Location: Right Arm)   Pulse 81   Temp (!) 97.4 F (36.3 C) (Oral)   Resp 16   Ht 6\' 1"  (1.854 m)   Wt 81.6 kg (180 lb)   SpO2 96%   BMI 23.75 kg/m   Physical Exam  Constitutional: He appears well-developed. No distress.  HENT:  Head: Normocephalic and atraumatic.  Eyes: Conjunctivae and EOM are normal.  Cardiovascular: Normal rate and regular rhythm.   Pulmonary/Chest: Effort normal. No stridor. No respiratory distress.  Abdominal: He exhibits no distension.  Musculoskeletal: He exhibits no edema.       Right elbow: Normal.      Left elbow: Tenderness found.       Right wrist:  Normal.       Left wrist: Normal.  Neurological: He is alert.  Poor hearing, oriented to self, moves all extremity spontaneously, follows commands appropriately.  Sensation and motor intact in both distal upper extremities.  Skin: Skin is warm and dry.  Skin tears bilateral arms, currently dressed, will require additional eval  Psychiatric: Cognition and memory are impaired.  Nursing note and vitals reviewed.    ED Treatments / Results   Radiology Dg Elbow Complete Left  Result Date: 05/04/2017 CLINICAL DATA:  83 year old male with history of trauma from a fall today with points can on the left elbow. Pain in the left elbow. EXAM: LEFT ELBOW - COMPLETE 3+ VIEW COMPARISON:  No priors. FINDINGS: There is no evidence of fracture, dislocation, or joint effusion. There is no evidence of arthropathy or other focal bone abnormality. Soft tissues are unremarkable. IMPRESSION: Negative. Electronically Signed   By: Vinnie Langton M.D.   On: 05/04/2017 19:33    Procedures Procedures (including critical care time)    Initial Impression / Assessment and Plan / ED Course  I have reviewed the triage vital signs and the nursing notes.  Pertinent labs & imaging results that were available during my care of the patient were reviewed by me and considered in my medical decision making (see chart for details).  On repeat exam the patient is calm, in no distress. After removing the bandaging, patient's forearm wounds are visualized. There is a small skin tear on the right forearm, a large contusion on the left, both with oozing, but no substantial bleeding. No other deformities. Patient is ambulatory, in no distress. I discussed all findings with patient and his son. The patient has baseline dementia, appears to be in similar condition, and without other acute findings, is appropriate for discharge with outpatient follow-up.  Final Clinical Impressions(s) / ED Diagnoses   Final diagnoses:  Fall,  initial encounter  Multiple skin tears     Carmin Muskrat, MD 05/04/17 2257

## 2017-05-04 NOTE — ED Notes (Signed)
Pt's arms wrapped in non-adhesive dressing with gauze.

## 2017-08-06 DIAGNOSIS — S060X9A Concussion with loss of consciousness of unspecified duration, initial encounter: Secondary | ICD-10-CM | POA: Diagnosis not present

## 2017-08-06 DIAGNOSIS — Z79899 Other long term (current) drug therapy: Secondary | ICD-10-CM | POA: Diagnosis not present

## 2017-08-06 DIAGNOSIS — Z0001 Encounter for general adult medical examination with abnormal findings: Secondary | ICD-10-CM | POA: Diagnosis not present

## 2017-08-06 DIAGNOSIS — R2681 Unsteadiness on feet: Secondary | ICD-10-CM | POA: Diagnosis not present

## 2017-08-06 DIAGNOSIS — K4090 Unilateral inguinal hernia, without obstruction or gangrene, not specified as recurrent: Secondary | ICD-10-CM | POA: Diagnosis not present

## 2017-08-06 DIAGNOSIS — I10 Essential (primary) hypertension: Secondary | ICD-10-CM | POA: Diagnosis not present

## 2017-08-06 DIAGNOSIS — H918X3 Other specified hearing loss, bilateral: Secondary | ICD-10-CM | POA: Diagnosis not present

## 2017-09-14 ENCOUNTER — Emergency Department (HOSPITAL_COMMUNITY): Payer: Medicare Other

## 2017-09-14 ENCOUNTER — Emergency Department (HOSPITAL_COMMUNITY)
Admission: EM | Admit: 2017-09-14 | Discharge: 2017-09-14 | Disposition: A | Discharge status: Home / Self Care | Payer: Medicare Other | Attending: Emergency Medicine | Admitting: Emergency Medicine

## 2017-09-14 ENCOUNTER — Encounter (HOSPITAL_COMMUNITY): Payer: Self-pay | Admitting: Pharmacy Technician

## 2017-09-14 DIAGNOSIS — Z79899 Other long term (current) drug therapy: Secondary | ICD-10-CM | POA: Diagnosis not present

## 2017-09-14 DIAGNOSIS — I10 Essential (primary) hypertension: Secondary | ICD-10-CM | POA: Diagnosis not present

## 2017-09-14 DIAGNOSIS — W010XXA Fall on same level from slipping, tripping and stumbling without subsequent striking against object, initial encounter: Secondary | ICD-10-CM | POA: Insufficient documentation

## 2017-09-14 DIAGNOSIS — S0990XA Unspecified injury of head, initial encounter: Secondary | ICD-10-CM

## 2017-09-14 DIAGNOSIS — S0101XA Laceration without foreign body of scalp, initial encounter: Secondary | ICD-10-CM | POA: Diagnosis not present

## 2017-09-14 DIAGNOSIS — S161XXA Strain of muscle, fascia and tendon at neck level, initial encounter: Secondary | ICD-10-CM | POA: Diagnosis not present

## 2017-09-14 DIAGNOSIS — F10120 Alcohol abuse with intoxication, uncomplicated: Secondary | ICD-10-CM | POA: Diagnosis not present

## 2017-09-14 DIAGNOSIS — Y9301 Activity, walking, marching and hiking: Secondary | ICD-10-CM | POA: Diagnosis not present

## 2017-09-14 DIAGNOSIS — S064X0A Epidural hemorrhage without loss of consciousness, initial encounter: Secondary | ICD-10-CM | POA: Diagnosis not present

## 2017-09-14 DIAGNOSIS — S134XXA Sprain of ligaments of cervical spine, initial encounter: Secondary | ICD-10-CM | POA: Diagnosis not present

## 2017-09-14 DIAGNOSIS — Z8551 Personal history of malignant neoplasm of bladder: Secondary | ICD-10-CM | POA: Insufficient documentation

## 2017-09-14 DIAGNOSIS — Y998 Other external cause status: Secondary | ICD-10-CM | POA: Insufficient documentation

## 2017-09-14 DIAGNOSIS — F10920 Alcohol use, unspecified with intoxication, uncomplicated: Secondary | ICD-10-CM | POA: Diagnosis not present

## 2017-09-14 DIAGNOSIS — W19XXXA Unspecified fall, initial encounter: Secondary | ICD-10-CM

## 2017-09-14 DIAGNOSIS — Y929 Unspecified place or not applicable: Secondary | ICD-10-CM | POA: Insufficient documentation

## 2017-09-14 DIAGNOSIS — Z7982 Long term (current) use of aspirin: Secondary | ICD-10-CM | POA: Diagnosis not present

## 2017-09-14 DIAGNOSIS — S199XXA Unspecified injury of neck, initial encounter: Secondary | ICD-10-CM | POA: Diagnosis not present

## 2017-09-14 LAB — CBC WITH DIFFERENTIAL/PLATELET
Basophils Absolute: 0 10*3/uL (ref 0.0–0.1)
Basophils Relative: 1 %
Eosinophils Absolute: 0.2 10*3/uL (ref 0.0–0.7)
Eosinophils Relative: 3 %
HCT: 47.4 % (ref 39.0–52.0)
Hemoglobin: 15.6 g/dL (ref 13.0–17.0)
Lymphocytes Relative: 22 %
Lymphs Abs: 1.4 10*3/uL (ref 0.7–4.0)
MCH: 31.7 pg (ref 26.0–34.0)
MCHC: 32.9 g/dL (ref 30.0–36.0)
MCV: 96.3 fL (ref 78.0–100.0)
Monocytes Absolute: 0.6 10*3/uL (ref 0.1–1.0)
Monocytes Relative: 9 %
Neutro Abs: 4.3 10*3/uL (ref 1.7–7.7)
Neutrophils Relative %: 65 %
Platelets: 163 10*3/uL (ref 150–400)
RBC: 4.92 MIL/uL (ref 4.22–5.81)
RDW: 14.1 % (ref 11.5–15.5)
WBC: 6.5 10*3/uL (ref 4.0–10.5)

## 2017-09-14 LAB — COMPREHENSIVE METABOLIC PANEL
ALT: 18 U/L (ref 17–63)
AST: 31 U/L (ref 15–41)
Albumin: 3.5 g/dL (ref 3.5–5.0)
Alkaline Phosphatase: 116 U/L (ref 38–126)
Anion gap: 11 (ref 5–15)
BUN: 21 mg/dL — ABNORMAL HIGH (ref 6–20)
CO2: 21 mmol/L — ABNORMAL LOW (ref 22–32)
Calcium: 8.8 mg/dL — ABNORMAL LOW (ref 8.9–10.3)
Chloride: 104 mmol/L (ref 101–111)
Creatinine, Ser: 1.16 mg/dL (ref 0.61–1.24)
GFR calc Af Amer: >60 mL/min (ref >60)
GFR calc non Af Amer: 55 mL/min — ABNORMAL LOW (ref >60)
Glucose, Bld: 107 mg/dL — ABNORMAL HIGH (ref 65–99)
Potassium: 4.4 mmol/L (ref 3.5–5.1)
Sodium: 136 mmol/L (ref 135–145)
Total Bilirubin: 0.8 mg/dL (ref 0.3–1.2)
Total Protein: 5.8 g/dL — ABNORMAL LOW (ref 6.5–8.1)

## 2017-09-14 LAB — ETHANOL: Alcohol, Ethyl (B): 109 mg/dL — ABNORMAL HIGH (ref <10)

## 2017-09-14 MED ORDER — LIDOCAINE-EPINEPHRINE (PF) 2 %-1:200000 IJ SOLN
10.0000 mL | Freq: Once | INTRAMUSCULAR | Status: AC
  Administered 2017-09-14: 10 mL via INTRADERMAL
  Filled 2017-09-14: qty 20
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
## 2017-09-14 NOTE — ED Notes (Signed)
Pt denies LOC 

## 2017-09-14 NOTE — ED Notes (Signed)
Pt ambulated as a standby assist. Stable gait noted. Pt denied dizziness or pain, endorsed feeling normal.

## 2017-09-14 NOTE — ED Provider Notes (Signed)
..  Laceration Repair Date/Time: 09/14/2017 6:21 PM Performed by: Jeannett Senior, PA-C Authorized by: Jeannett Senior, PA-C   Consent:    Consent obtained:  Verbal   Consent given by:  Patient   Risks discussed:  Infection, pain and need for additional repair   Alternatives discussed:  No treatment Anesthesia (see MAR for exact dosages):    Anesthesia method:  Local infiltration   Local anesthetic:  Lidocaine 2% WITH epi Laceration details:    Location:  Scalp   Scalp location:  L parietal   Length (cm):  3 Repair type:    Repair type:  Simple Pre-procedure details:    Preparation:  Patient was prepped and draped in usual sterile fashion Exploration:    Wound exploration: wound explored through full range of motion and entire depth of wound probed and visualized   Treatment:    Area cleansed with:  Saline and Shur-Clens   Amount of cleaning:  Standard   Irrigation method:  Pressure wash Skin repair:    Repair method:  Staples   Number of staples:  3 Approximation:    Approximation:  Close   Vermilion border: well-aligned   Post-procedure details:    Patient tolerance of procedure:  Tolerated well, no immediate complications      Janee Morn 09/14/17 Elson Clan, MD 09/14/17 1904

## 2017-09-14 NOTE — ED Notes (Signed)
Patient given discharge instructions and verbalized understanding.  Patient stable to discharge at this time.  Patient is alert and oriented to baseline.  No distressed noted at this time.  All belongings taken with the patient at discharge.   
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
## 2018-01-24 IMAGING — DX DG ELBOW COMPLETE 3+V*L*
4 series · 4 of 4 positions shown · non-contrast
Comparison: No priors.

CLINICAL DATA: 89-year-old male with history of trauma from a fall
today with points can on the left elbow. Pain in the left elbow.

EXAM:
LEFT ELBOW - COMPLETE 3+ VIEW

[elbow obl (1 of 2)]
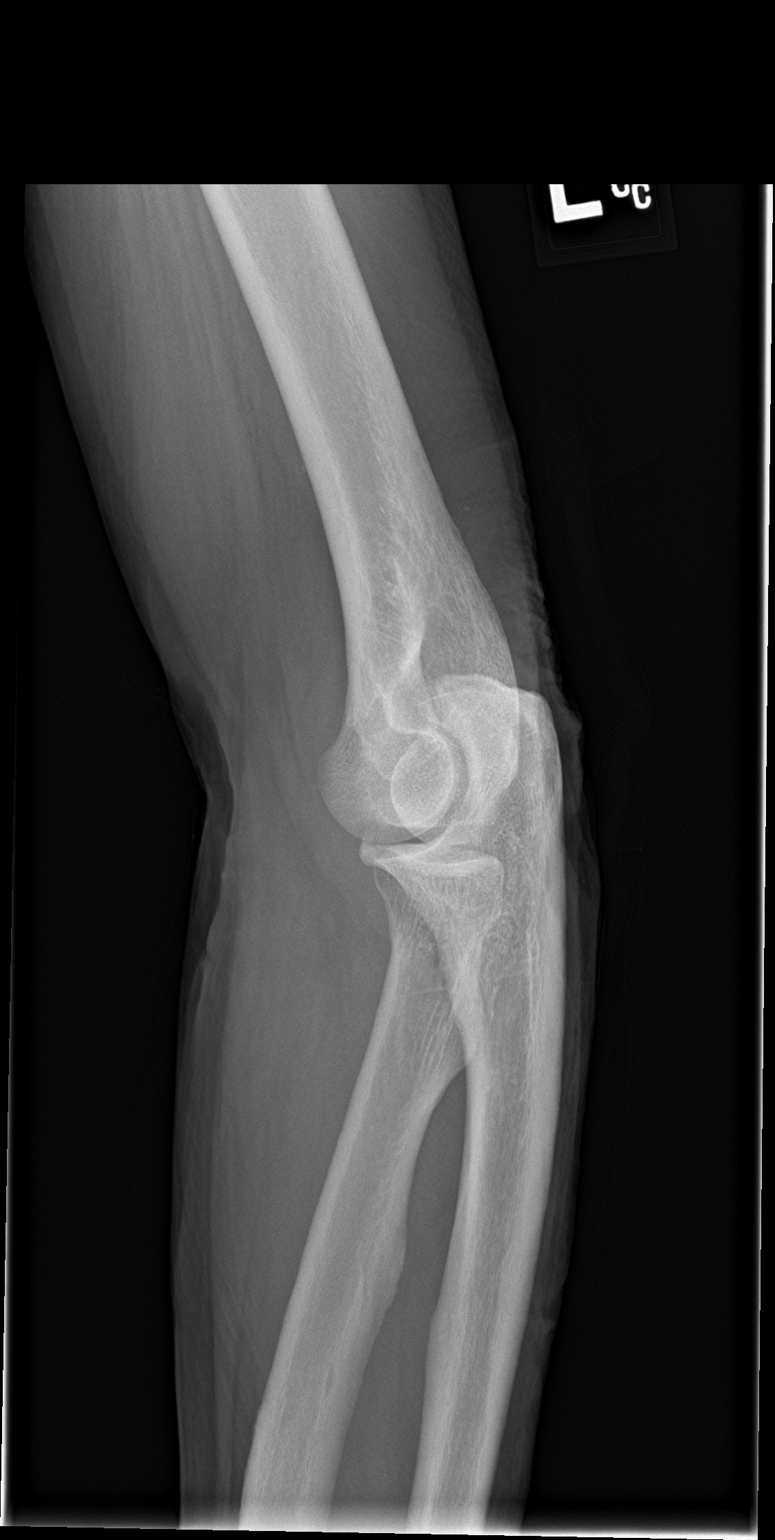

[elbow obl (2 of 2)]
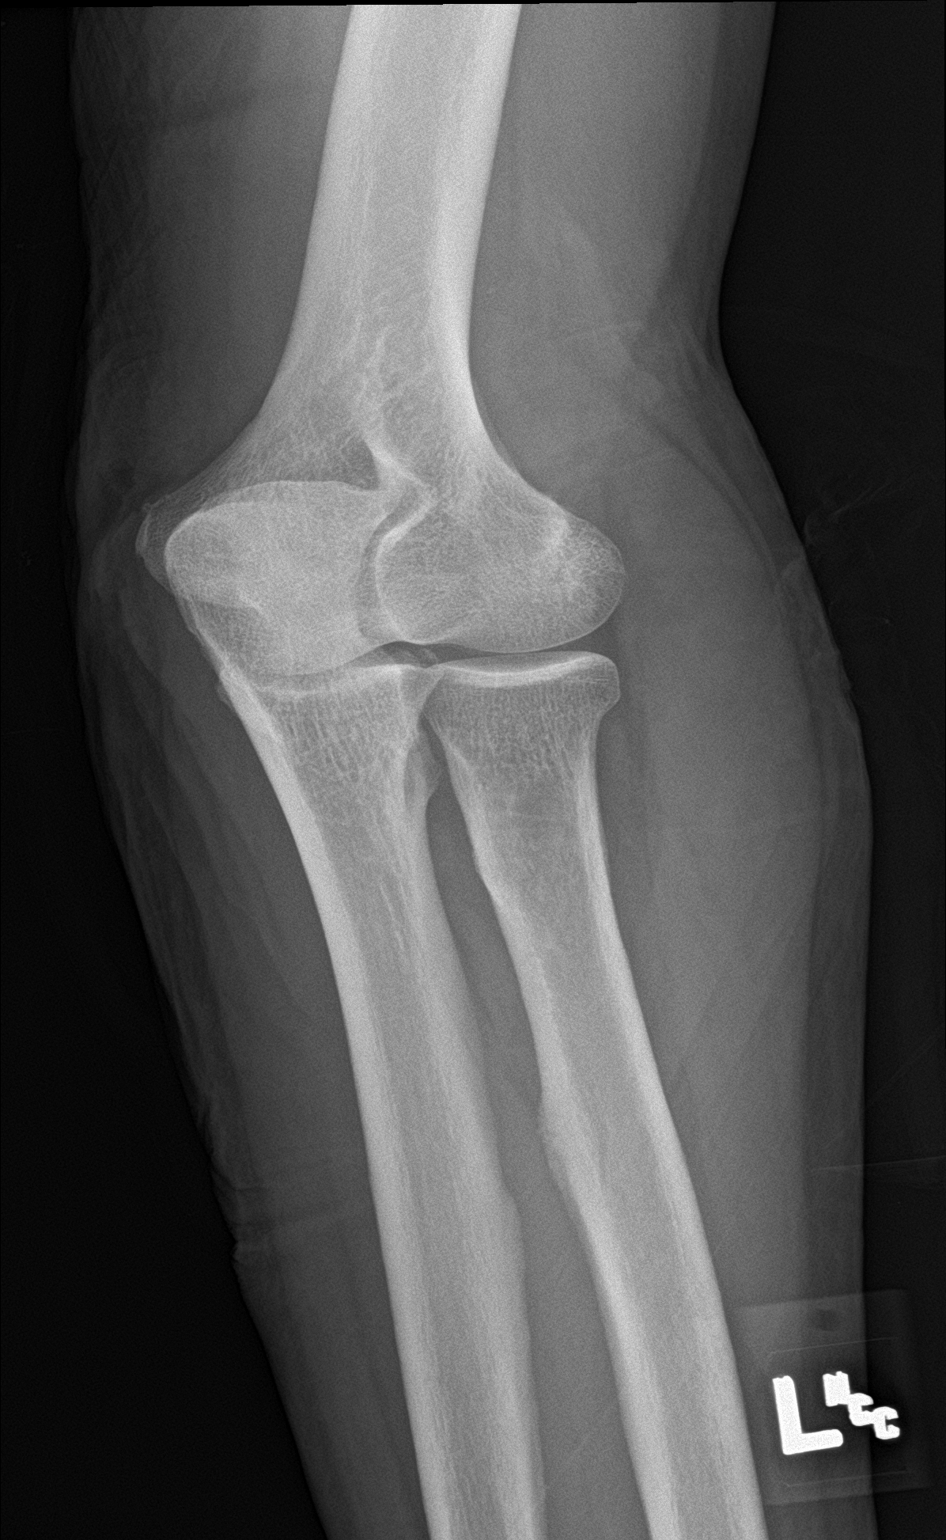

[elbow lat]
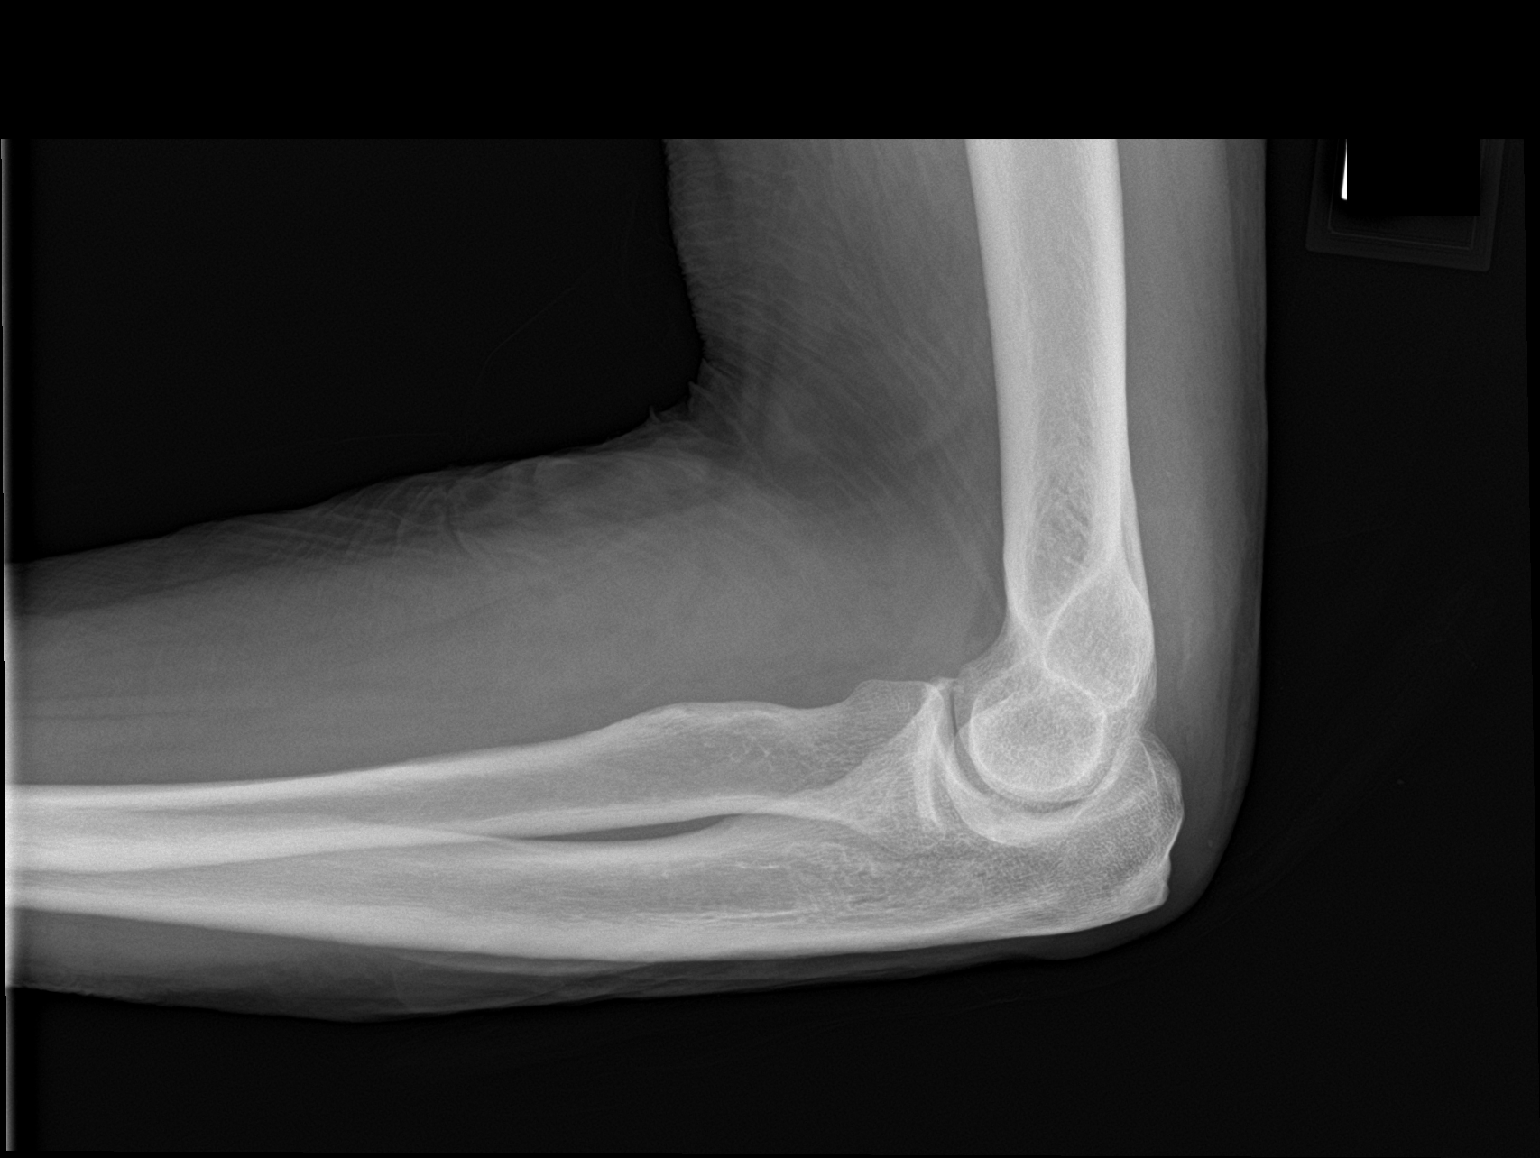

[elbow ap]
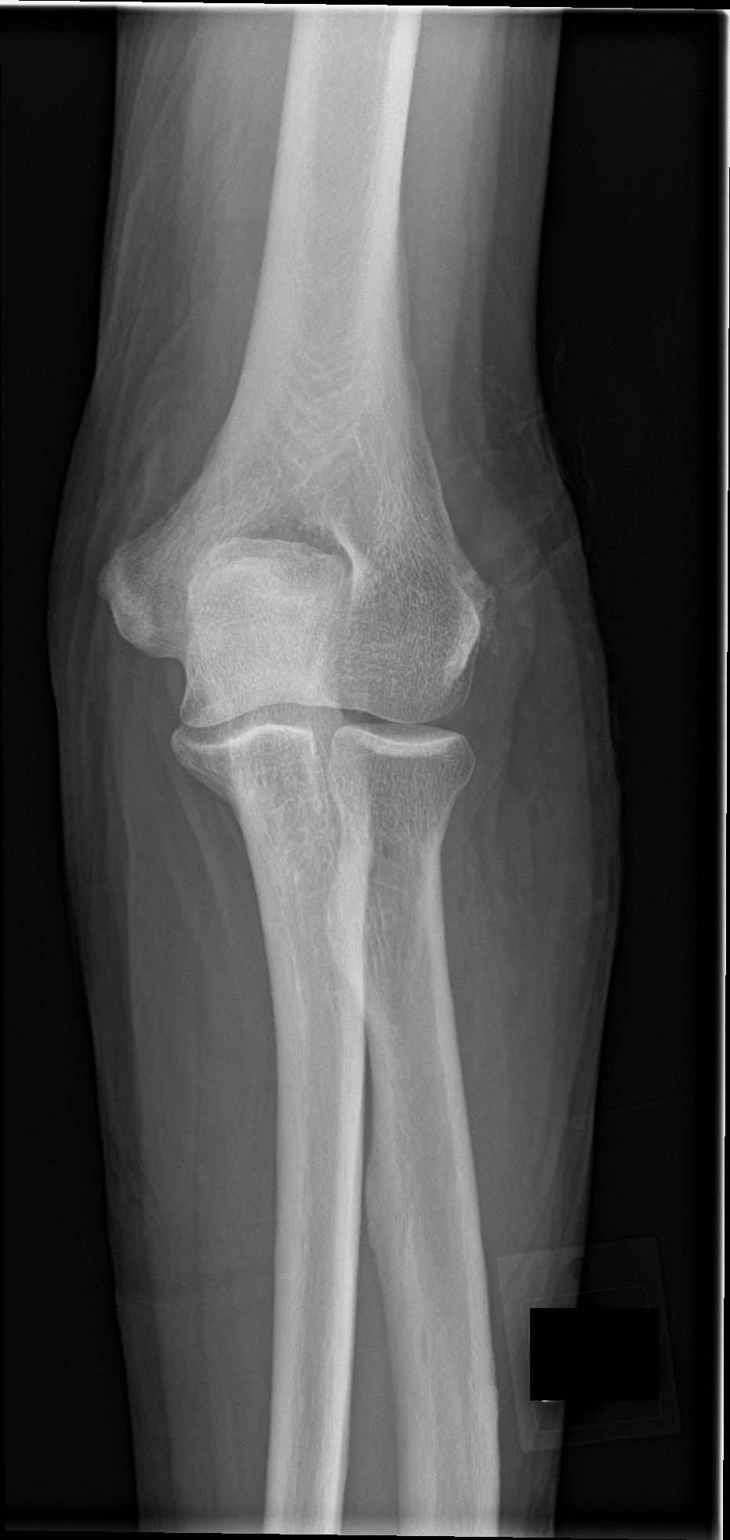

[4 of 4 positions shown; findings below may reference images not displayed]

FINDINGS: There is no evidence of fracture, dislocation, or joint effusion.
There is no evidence of arthropathy or other focal bone abnormality.
Soft tissues are unremarkable.
IMPRESSION: Negative.

## 2018-01-28 ENCOUNTER — Ambulatory Visit (HOSPITAL_COMMUNITY)
Admission: EM | Admit: 2018-01-28 | Discharge: 2018-01-28 | Disposition: A | Discharge status: Other Acute Inpatient Hospital | Payer: Medicare Other

## 2018-02-05 DIAGNOSIS — R2681 Unsteadiness on feet: Secondary | ICD-10-CM | POA: Diagnosis not present

## 2018-02-05 DIAGNOSIS — S060X0S Concussion without loss of consciousness, sequela: Secondary | ICD-10-CM | POA: Diagnosis not present

## 2018-02-05 DIAGNOSIS — H918X3 Other specified hearing loss, bilateral: Secondary | ICD-10-CM | POA: Diagnosis not present

## 2018-02-05 DIAGNOSIS — I10 Essential (primary) hypertension: Secondary | ICD-10-CM | POA: Diagnosis not present

## 2018-03-09 ENCOUNTER — Emergency Department (HOSPITAL_COMMUNITY): Payer: Medicare Other

## 2018-03-09 ENCOUNTER — Observation Stay (HOSPITAL_COMMUNITY)
Admission: EM | Admit: 2018-03-09 | Discharge: 2018-03-11 | Disposition: A | Discharge status: Home / Self Care | Payer: Medicare Other | Attending: Internal Medicine | Admitting: Internal Medicine

## 2018-03-09 ENCOUNTER — Encounter (HOSPITAL_COMMUNITY): Payer: Self-pay

## 2018-03-09 DIAGNOSIS — R262 Difficulty in walking, not elsewhere classified: Secondary | ICD-10-CM | POA: Diagnosis present

## 2018-03-09 DIAGNOSIS — Y92009 Unspecified place in unspecified non-institutional (private) residence as the place of occurrence of the external cause: Secondary | ICD-10-CM

## 2018-03-09 DIAGNOSIS — W19XXXA Unspecified fall, initial encounter: Secondary | ICD-10-CM

## 2018-03-09 DIAGNOSIS — E785 Hyperlipidemia, unspecified: Secondary | ICD-10-CM | POA: Insufficient documentation

## 2018-03-09 DIAGNOSIS — S7001XA Contusion of right hip, initial encounter: Secondary | ICD-10-CM | POA: Diagnosis not present

## 2018-03-09 DIAGNOSIS — R600 Localized edema: Secondary | ICD-10-CM | POA: Diagnosis not present

## 2018-03-09 DIAGNOSIS — N2889 Other specified disorders of kidney and ureter: Secondary | ICD-10-CM | POA: Diagnosis not present

## 2018-03-09 DIAGNOSIS — S32391A Other fracture of right ilium, initial encounter for closed fracture: Secondary | ICD-10-CM | POA: Diagnosis not present

## 2018-03-09 DIAGNOSIS — S32301A Unspecified fracture of right ilium, initial encounter for closed fracture: Secondary | ICD-10-CM | POA: Diagnosis not present

## 2018-03-09 DIAGNOSIS — M25559 Pain in unspecified hip: Secondary | ICD-10-CM | POA: Diagnosis present

## 2018-03-09 DIAGNOSIS — Z8551 Personal history of malignant neoplasm of bladder: Secondary | ICD-10-CM | POA: Diagnosis not present

## 2018-03-09 DIAGNOSIS — I10 Essential (primary) hypertension: Secondary | ICD-10-CM | POA: Diagnosis not present

## 2018-03-09 DIAGNOSIS — M25551 Pain in right hip: Secondary | ICD-10-CM | POA: Diagnosis present

## 2018-03-09 DIAGNOSIS — Z7982 Long term (current) use of aspirin: Secondary | ICD-10-CM | POA: Insufficient documentation

## 2018-03-09 DIAGNOSIS — W1830XA Fall on same level, unspecified, initial encounter: Secondary | ICD-10-CM | POA: Diagnosis not present

## 2018-03-09 DIAGNOSIS — S32311A Displaced avulsion fracture of right ilium, initial encounter for closed fracture: Secondary | ICD-10-CM | POA: Diagnosis not present

## 2018-03-09 DIAGNOSIS — Z79899 Other long term (current) drug therapy: Secondary | ICD-10-CM | POA: Diagnosis not present

## 2018-03-09 DIAGNOSIS — I4510 Unspecified right bundle-branch block: Secondary | ICD-10-CM | POA: Diagnosis not present

## 2018-03-09 LAB — URINALYSIS, ROUTINE W REFLEX MICROSCOPIC
Bilirubin Urine: NEGATIVE
Glucose, UA: NEGATIVE mg/dL
Hgb urine dipstick: NEGATIVE
Ketones, ur: NEGATIVE mg/dL
Leukocytes, UA: NEGATIVE
Nitrite: NEGATIVE
Protein, ur: NEGATIVE mg/dL
Specific Gravity, Urine: 1.034 — ABNORMAL HIGH (ref 1.005–1.030)
pH: 6 (ref 5.0–8.0)

## 2018-03-09 LAB — CBC WITH DIFFERENTIAL/PLATELET
Abs Immature Granulocytes: 0.1 10*3/uL (ref 0.0–0.1)
Basophils Absolute: 0.1 10*3/uL (ref 0.0–0.1)
Basophils Relative: 1 %
Eosinophils Absolute: 0.1 10*3/uL (ref 0.0–0.7)
Eosinophils Relative: 1 %
HCT: 41.1 % (ref 39.0–52.0)
Hemoglobin: 13.5 g/dL (ref 13.0–17.0)
Immature Granulocytes: 1 %
Lymphocytes Relative: 10 %
Lymphs Abs: 0.9 10*3/uL (ref 0.7–4.0)
MCH: 31.4 pg (ref 26.0–34.0)
MCHC: 32.8 g/dL (ref 30.0–36.0)
MCV: 95.6 fL (ref 78.0–100.0)
Monocytes Absolute: 1 10*3/uL (ref 0.1–1.0)
Monocytes Relative: 12 %
Neutro Abs: 6.3 10*3/uL (ref 1.7–7.7)
Neutrophils Relative %: 75 %
Platelets: 227 10*3/uL (ref 150–400)
RBC: 4.3 MIL/uL (ref 4.22–5.81)
RDW: 12.4 % (ref 11.5–15.5)
WBC: 8.5 10*3/uL (ref 4.0–10.5)

## 2018-03-09 LAB — COMPREHENSIVE METABOLIC PANEL
ALT: 15 U/L — ABNORMAL LOW (ref 17–63)
AST: 27 U/L (ref 15–41)
Albumin: 3.2 g/dL — ABNORMAL LOW (ref 3.5–5.0)
Alkaline Phosphatase: 94 U/L (ref 38–126)
Anion gap: 9 (ref 5–15)
BUN: 17 mg/dL (ref 6–20)
CO2: 30 mmol/L (ref 22–32)
Calcium: 10.1 mg/dL (ref 8.9–10.3)
Chloride: 99 mmol/L — ABNORMAL LOW (ref 101–111)
Creatinine, Ser: 1.11 mg/dL (ref 0.61–1.24)
GFR calc Af Amer: >60 mL/min (ref >60)
GFR calc non Af Amer: 57 mL/min — ABNORMAL LOW (ref >60)
Glucose, Bld: 127 mg/dL — ABNORMAL HIGH (ref 65–99)
Potassium: 5 mmol/L (ref 3.5–5.1)
Sodium: 138 mmol/L (ref 135–145)
Total Bilirubin: 1.2 mg/dL (ref 0.3–1.2)
Total Protein: 5.6 g/dL — ABNORMAL LOW (ref 6.5–8.1)

## 2018-03-09 MED ORDER — ACETAMINOPHEN 325 MG PO TABS: 650.0000 mg | ORAL_TABLET | Freq: Four times a day (QID) | ORAL | Status: DC | PRN

## 2018-03-09 MED ORDER — SODIUM CHLORIDE 0.9 % IV SOLN
INTRAVENOUS | Status: DC
  Administered 2018-03-09: 19:00:00 via INTRAVENOUS
  Administered 2018-03-10: 1000 mL via INTRAVENOUS

## 2018-03-09 MED ORDER — LISINOPRIL-HYDROCHLOROTHIAZIDE 20-25 MG PO TABS: 1.0000 | ORAL_TABLET | Freq: Every day | ORAL | Status: DC

## 2018-03-09 MED ORDER — HYDROCHLOROTHIAZIDE 25 MG PO TABS
25.0000 mg | ORAL_TABLET | Freq: Every day | ORAL | Status: DC
  Administered 2018-03-10 – 2018-03-11 (×2): 25 mg via ORAL
  Filled 2018-03-09 (×2): qty 1

## 2018-03-09 MED ORDER — IOHEXOL 300 MG/ML  SOLN
100.0000 mL | Freq: Once | INTRAMUSCULAR | Status: AC | PRN
  Administered 2018-03-09: 100 mL via INTRAVENOUS

## 2018-03-09 MED ORDER — LISINOPRIL 20 MG PO TABS
20.0000 mg | ORAL_TABLET | Freq: Every day | ORAL | Status: DC
  Administered 2018-03-10 – 2018-03-11 (×2): 20 mg via ORAL
  Filled 2018-03-09 (×2): qty 1

## 2018-03-09 MED ORDER — HYDROCODONE-ACETAMINOPHEN 5-325 MG PO TABS: 1.0000 | ORAL_TABLET | ORAL | Status: DC | PRN

## 2018-03-09 MED ORDER — SENNOSIDES-DOCUSATE SODIUM 8.6-50 MG PO TABS: 1.0000 | ORAL_TABLET | Freq: Every evening | ORAL | Status: DC | PRN

## 2018-03-09 MED ORDER — ACETAMINOPHEN 650 MG RE SUPP: 650.0000 mg | Freq: Four times a day (QID) | RECTAL | Status: DC | PRN

## 2018-03-09 MED ORDER — FENTANYL CITRATE (PF) 100 MCG/2ML IJ SOLN
25.0000 ug | INTRAMUSCULAR | Status: DC | PRN
  Administered 2018-03-09: 25 ug via INTRAVENOUS
  Filled 2018-03-09: qty 2
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
## 2018-03-09 NOTE — ED Provider Notes (Signed)
[facility] EMERGENCY DEPARTMENT Provider Note   CSN: [id number] Arrival date & time: 03/09/18  1631     History   Chief Complaint Chief Complaint  Patient presents with  . Hip Pain    HPI Ryan Tyler is a 84 y.o. male.  Patient with history of bladder tumor, high blood pressure, aspirin use presents with rightlower flank and right hip pain for the past 3 or 4 days. Unable to bear weight due to pain. Patient had mechanical fall because details and landed on the right hip. No history of hip fracture. Pain with any range of motion. Patient denies other symptoms.nonsmoker     Past Medical History:  Diagnosis Date  . Asymptomatic cholelithiasis   . Bladder tumor   . BPH (benign prostatic hypertrophy)   . Cancer ([place])    bladder  . GERD (gastroesophageal reflux disease)   . Hearing loss   . HOH (hard of hearing)    no hearing aids  . Hyperlipidemia   . Hypertension   . Kidney stones   . Mild left inguinal hernia   . Nocturia   . Urgency of urination     Patient Active Problem List   Diagnosis Date Noted  . Ambulatory dysfunction 03/09/2018  . Renal mass 03/09/2018  . Closed fracture of iliac crest, right, initial encounter ([place]) 03/09/2018  . Fall at home, initial encounter 03/09/2018  . Right hip pain 03/09/2018  . Fall 03/09/2018  . S/P laparoscopic cholecystectomy 08/16/2015    Past Surgical History:  Procedure Laterality Date  . CATARACT EXTRACTION W/ INTRAOCULAR LENS IMPLANT Left nov  2014  . CHOLECYSTECTOMY N/A 08/16/2015   Procedure: LAPAROSCOPIC CHOLECYSTECTOMY;  Surgeon: Rolm Bookbinder, MD;  Location: [facility];  Service: General;  Laterality: N/A;  . TONSILLECTOMY    . TRANSURETHRAL RESECTION OF BLADDER TUMOR WITH GYRUS (TURBT-GYRUS) N/A 04/05/2014   Procedure: TRANSURETHRAL RESECTION OF BLADDER TUMOR WITH GYRUS  AND INTRAVESICO CHEMO;  Surgeon: Claybon Jabs, MD;  Location: [facility];  Service: Urology;   Laterality: N/A;  . [place] Medications    Prior to Admission medications   Medication Sig Start Date End Date Taking? Authorizing Provider  aspirin EC 81 MG tablet Take 81 mg by mouth daily.   Yes [provider]  Cholecalciferol (VITAMIN D3 PO) Take 1 tablet by mouth daily.   Yes [provider]  lisinopril-hydrochlorothiazide (PRINZIDE,ZESTORETIC) 20-25 MG per tablet Take 1 tablet by mouth daily.    Yes [provider]  naproxen sodium (ALEVE) 220 MG tablet Take 220 mg by mouth daily as needed (pain).   Yes [provider]    Family History Family History  Problem Relation Age of Onset  . Cancer Mother   . Leukemia Other     Social History Social History   Tobacco Use  . Smoking status: Never Smoker  . Smokeless tobacco: Former Systems developer    Types: Chew  Substance Use Topics  . Alcohol use: Yes    Comment: 1 pint liquor most days.    . Drug use: No     Allergies   Patient has no known allergies.   Review of Systems Review of Systems  Constitutional: Negative for chills and fever.  HENT: Negative for congestion.   Eyes: Negative for visual disturbance.  Respiratory: Negative for shortness of breath.   Cardiovascular: Negative for chest pain.  Gastrointestinal: Negative for  abdominal pain and vomiting.  Genitourinary: Negative for dysuria and flank pain.  Musculoskeletal: Positive for arthralgias and gait problem. Negative for back pain, neck pain and neck stiffness.  Skin: Negative for rash.  Neurological: Negative for light-headedness and headaches.     Physical Exam Updated Vital Signs BP (!) 141/71 (BP Location: Left Arm)   Pulse 77   Temp 98.2 F (36.8 C) (Oral)   Resp 16   Ht 6' (1.829 m)   Wt 83 kg (183 lb)   SpO2 100%   BMI 24.82 kg/m   Physical Exam  Constitutional: He is oriented to person, place, and time. He appears well-developed and well-nourished.  HENT:    Head: Normocephalic and atraumatic.  Eyes: Conjunctivae are normal. Right eye exhibits no discharge. Left eye exhibits no discharge.  Neck: Normal range of motion. Neck supple. No tracheal deviation present.  Cardiovascular: Normal rate and regular rhythm.  Pulmonary/Chest: Effort normal and breath sounds normal.  Abdominal: Soft. He exhibits no distension. There is tenderness (right lower flank). There is no guarding.  Musculoskeletal: He exhibits edema and tenderness.  Patient has tenderness to palpation iliac crest and right hip lateral aspect. No tenderness to left leg. No right knee tenderness.  Neurological: He is alert and oriented to person, place, and time.  Skin: Skin is warm. Rash noted.  Patient is significant ecchymosis right mid lower flank and right thigh lateral on the right  Psychiatric: He has a normal mood and affect.  Nursing note and vitals reviewed.    ED Treatments / Results  Labs (all labs ordered are listed, but only abnormal results are displayed) Labs Reviewed  COMPREHENSIVE METABOLIC PANEL - Abnormal; Notable for the following components:      Result Value   Chloride 99 (*)    Glucose, Bld 127 (*)    Total Protein 5.6 (*)    Albumin 3.2 (*)    ALT 15 (*)    GFR calc non Af Amer 57 (*)    All other components within normal limits  URINALYSIS, ROUTINE W REFLEX MICROSCOPIC - Abnormal; Notable for the following components:   Specific Gravity, Urine 1.034 (*)    All other components within normal limits  BASIC METABOLIC PANEL - Abnormal; Notable for the following components:   Chloride 100 (*)    Glucose, Bld 115 (*)    All other components within normal limits  CBC - Abnormal; Notable for the following components:   RBC 3.91 (*)    Hemoglobin 12.4 (*)    HCT 37.4 (*)    All other components within normal limits  CBC WITH DIFFERENTIAL/PLATELET    EKG EKG Interpretation  Date/Time:  Sunday Mar 09 2018 18:30:07 EDT Ventricular Rate:  79 PR  Interval:    QRS Duration: 139 QT Interval:  397 QTC Calculation: 456 R Axis:   -55 Text Interpretation:  Sinus rhythm Multiple premature complexes, vent & supraven RBBB and LAFB Confirmed by Randal Buba, April ([id number]) on 03/10/2018 12:12:44 PM   Radiology Ct Abdomen Pelvis W Contrast  Result Date: 03/09/2018 CLINICAL DATA:  Fall, right flank bruising. EXAM: CT ABDOMEN AND PELVIS WITH CONTRAST TECHNIQUE: Multidetector CT imaging of the abdomen and pelvis was performed using the standard protocol following bolus administration of intravenous contrast. CONTRAST:  161mL OMNIPAQUE IOHEXOL 300 MG/ML  SOLN COMPARISON:  02/13/2017 FINDINGS: Lower chest: Right base atelectasis or scarring. No effusions. Heart is normal size. Hepatobiliary: Low-density lesion in the left hepatic lobe is again noted measuring up  to 2.2 cm, similar to prior study. No other focal hepatic abnormality. Prior cholecystectomy. Pancreas: No focal abnormality or ductal dilatation. Spleen: No focal abnormality.  Normal size. Adrenals/Urinary Tract: Nonobstructing left renal stones noted. No hydronephrosis. Exophytic cyst off the midpole of the left kidney measures 2.6 cm. New low-density lesion noted anteriorly within the superior pole on image 38 of series 3 measuring 2.0 cm. This appears to have areas of internal enhancement. This was not definitively seen on prior study. Stomach/Bowel: Left colonic diverticulosis. No active diverticulitis. No evidence of bowel obstruction. Vascular/Lymphatic: Diffuse aortic and iliac calcifications. No aneurysm or adenopathy. Reproductive: No visible focal abnormality. Other: No free fluid or free air. Large left inguinal hernia containing fat. Musculoskeletal: Displaced fracture noted through the right iliac bone as seen on prior pelvic plain films. Intramuscular hematoma within the right gluteal muscles. IMPRESSION: Displaced mildly comminuted fracture through the right iliac bone. Intramuscular hematoma  within the right gluteal muscles. Left colonic diverticulosis. New 2 cm low-density lesion within the superior pole of the left kidney which was not present on prior study and appears to be solid with areas of enhancement. This is concerning for possible renal neoplasm. Recommend further evaluation with MRI. Advanced atherosclerotic change within the aorta and iliac vessels. Large left inguinal hernia containing fat. Electronically Signed   By: Rolm Baptise M.D.   On: 03/09/2018 20:33   Dg Hip Unilat  With Pelvis 2-3 Views Right  Result Date: 03/09/2018 CLINICAL DATA:  Fall onto right hip.  Right leg pain EXAM: DG HIP (WITH OR WITHOUT PELVIS) 2-3V RIGHT COMPARISON:  None. FINDINGS: No proximal femoral fracture. There appears to be a fracture of the right iliac crest with displacement. SI joints appear symmetric and intact. Early degenerative changes in the hips bilaterally. IMPRESSION: Displaced fracture noted through the right iliac crest. No proximal femoral fracture/abnormality. Early degenerative changes in the hips. Electronically Signed   By: Rolm Baptise M.D.   On: 03/09/2018 17:49    Procedures Procedures (including critical care time)  Medications Ordered in ED Medications  acetaminophen (TYLENOL) tablet 650 mg (has no administration in time range)    Or  acetaminophen (TYLENOL) suppository 650 mg (has no administration in time range)  HYDROcodone-acetaminophen (NORCO/VICODIN) 5-325 MG per tablet 1-2 tablet (has no administration in time range)  senna-docusate (Senokot-S) tablet 1 tablet (has no administration in time range)  lisinopril (PRINIVIL,ZESTRIL) tablet 20 mg (20 mg Oral Given 03/10/18 1057)    And  hydrochlorothiazide (HYDRODIURIL) tablet 25 mg (25 mg Oral Given 03/10/18 1057)  iohexol (OMNIPAQUE) 300 MG/ML solution 100 mL (100 mLs Intravenous Contrast Given 03/09/18 1954)     Initial Impression / Assessment and Plan / ED Course  I have reviewed the triage vital signs and the  nursing notes.  Pertinent labs & imaging results that were available during my care of the patient were reviewed by me and considered in my medical decision making (see chart for details).     Patient presents with inability to walk due to right hip pain. X-ray reviewed iliac crest fracture. We significant ecchymosis and tenderness CT scans ordered. Blood work ordered. Plan for medicine admission for pain control, physical therapy and also consult. Patient CT scan results reviewed showing details of iliac crest fracture with incidental finding of new renal masses. Patient will need close outpatient follow-up/MRI if indicated for this new finding. Updated patient and son on these findings.  Paged ortho and hospitalist.   The patients results and plan  were reviewed and discussed.   Any x-rays performed were independently reviewed by myself.   Differential diagnosis were considered with the presenting HPI.  Medications  acetaminophen (TYLENOL) tablet 650 mg (has no administration in time range)    Or  acetaminophen (TYLENOL) suppository 650 mg (has no administration in time range)  HYDROcodone-acetaminophen (NORCO/VICODIN) 5-325 MG per tablet 1-2 tablet (has no administration in time range)  senna-docusate (Senokot-S) tablet 1 tablet (has no administration in time range)  lisinopril (PRINIVIL,ZESTRIL) tablet 20 mg (20 mg Oral Given 03/10/18 1057)    And  hydrochlorothiazide (HYDRODIURIL) tablet 25 mg (25 mg Oral Given 03/10/18 1057)  iohexol (OMNIPAQUE) 300 MG/ML solution 100 mL (100 mLs Intravenous Contrast Given 03/09/18 1954)    Vitals:   03/10/18 0015 03/10/18 0810 03/10/18 1057 03/10/18 1401  BP: 132/75 (!) 143/65 136/73 (!) 141/71  Pulse: 78 76 67 77  Resp: 16 20 16    Temp:  97.9 F (36.6 C) 98 F (36.7 C) 98.2 F (36.8 C)  TempSrc:  Oral Oral Oral  SpO2: 97% 96% 98% 100%  Weight:   83 kg (183 lb)   Height:   6' (1.829 m)     Final diagnoses:  Closed fracture of right  iliac crest, initial encounter ([place])  Fall, initial encounter  Renal mass    Admission/ observation were discussed with the admitting physician, patient and/or family and they are comfortable with the plan.     Final Clinical Impressions(s) / ED Diagnoses   Final diagnoses:  Closed fracture of right iliac crest, initial encounter [facility])  Fall, initial encounter  Renal mass    ED Discharge Orders    None       Elnora Morrison, MD 03/10/18 1636

## 2018-03-09 NOTE — ED Notes (Signed)
Patient transported to CT 

## 2018-03-09 NOTE — H&P (Addendum)
History and Physical    STEFFEN HASE TDV:[id number] DOB: May 03, 1928 DOA: 03/09/2018  PCP: Josetta Huddle, MD   Patient coming from: Home   Chief Complaint: Hip pain  HPI: Ryan Tyler is a 84 y.o. male with medical history significant of bladder tumor, hypertension, BPH, GERD, hyperlipidemia presented to the hospital today with complaints of hip pain and ambulatory dysfunction.  Patient had a fall recently and landed on the right hip.  Patient stated that he was wearing or socks on the hardwood floor when he fell.  He denies any dizziness lightheadedness chest pain or palpitation prior to the fall.  He initially did not want to come to the hospital and for the last 3 to 4 days he has been having pain over the flank and right hip usually on bearing weight is getting worse to the point where he was unable to bear weight today.  Patient's son then decided to bring him to the hospital.  Patient denies chest pain, palpitations or shortness of breath.  Denies cough, fever chills or Rigor.  Patient denies any urinary urgency, frequency or dysuria.  Denies hematuria.  Denies nausea vomiting or diarrhea.  ED Course: In the ED patient had a x-ray of the hip including the CT scan of the abdomen and pelvis.  This showed closed mildly displaced fracture of the right iliac crest with intramuscular hematoma and incidental to lesions in the left kidney which were solid with enhancement.  Because of ambulatory dysfunction and pain, patient was considered for admission to the hospital.  Review of Systems: As per HPI otherwise 10 point review of systems negative.    Past Medical History:  Diagnosis Date  . Asymptomatic cholelithiasis   . Bladder tumor   . BPH (benign prostatic hypertrophy)   . Cancer ([place])    bladder  . GERD (gastroesophageal reflux disease)   . Hearing loss   . HOH (hard of hearing)    no hearing aids  . Hyperlipidemia   . Hypertension   . Kidney stones   . Mild left inguinal hernia     . Nocturia   . Urgency of urination     Past Surgical History:  Procedure Laterality Date  . CATARACT EXTRACTION W/ INTRAOCULAR LENS IMPLANT Left nov  2014  . CHOLECYSTECTOMY N/A 08/16/2015   Procedure: LAPAROSCOPIC CHOLECYSTECTOMY;  Surgeon: Rolm Bookbinder, MD;  Location: [place];  Service: General;  Laterality: N/A;  . TONSILLECTOMY    . TRANSURETHRAL RESECTION OF BLADDER TUMOR WITH GYRUS (TURBT-GYRUS) N/A 04/05/2014   Procedure: TRANSURETHRAL RESECTION OF BLADDER TUMOR WITH GYRUS  AND INTRAVESICO CHEMO;  Surgeon: Claybon Jabs, MD;  Location: [facility];  Service: Urology;  Laterality: N/A;  . [place]     reports that he has never smoked. He quit smokeless tobacco use about 8 years ago. His smokeless tobacco use included chew. He reports that he drinks alcohol. He reports that he does not use drugs.  No Known Allergies  Family History  Problem Relation Age of Onset  . Cancer Mother   . Leukemia Other      Prior to Admission medications   Medication Sig Start Date End Date Taking? Authorizing Provider  aspirin EC 81 MG tablet Take 81 mg by mouth daily.    [provider]  lisinopril-hydrochlorothiazide (PRINZIDE,ZESTORETIC) 20-25 MG per tablet Take 1 tablet by mouth daily.     [provider]  naproxen sodium (ALEVE) 220 MG tablet  Take 220 mg by mouth daily as needed (pain).    [provider]  simvastatin (ZOCOR) 20 MG tablet Take 20 mg by mouth daily with supper.     [provider]  Tetrahydrozoline HCl (VISINE OP) Place 1 drop into both eyes daily as needed (dry eyes/ irritation).    [provider]    Physical Exam: Vitals:   03/09/18 1930 03/09/18 1945 03/09/18 2000 03/09/18 2015  BP: (!) 141/62 135/64 (!) 146/64 (!) 157/90  Pulse: 76 79 78 86  Resp: 18 17 (!) 26 (!) 27  Temp:      TempSrc:      SpO2: 97% 96% 96% 98%    Constitutional: NAD, calm, comfortable Vitals:    03/09/18 1930 03/09/18 1945 03/09/18 2000 03/09/18 2015  BP: (!) 141/62 135/64 (!) 146/64 (!) 157/90  Pulse: 76 79 78 86  Resp: 18 17 (!) 26 (!) 27  Temp:      TempSrc:      SpO2: 97% 96% 96% 98%   Eyes: PERRL, lids and conjunctivae normal, hard of hearing ENMT: Mucous membranes are moist. Posterior pharynx clear of any exudate or lesions.Normal dentition.  Neck: normal, supple, no masses, no thyromegaly Respiratory: clear to auscultation bilaterally, no wheezing, no crackles. Normal respiratory effort. No accessory muscle use.  Cardiovascular: Regular rate and rhythm, no murmurs / rubs / gallops.  Mild lower extremity edema. 2+ pedal pulses. No carotid bruits.  Abdomen: no tenderness, no masses palpated. No hepatosplenomegaly. Bowel sounds positive.  Ecchymosis over the right flank.  Condom catheter in place Musculoskeletal: no clubbing / cyanosis. No joint deformity upper and lower extremities.  Normal muscle tone.  Right hip tenderness on palpation but able to move the right hip Skin: no rashes, lesions, ulcers. No induration.  Ecchymosis present in the right lower flank and right thigh. Neurologic: CN 2-12 grossly intact. Sensation intact, DTR normal. Strength 5/5 in all 4.  Psychiatric: Normal judgment and insight. Alert and oriented x 3. Normal mood.   Foley Catheter: Condom catheter  Labs on Admission: I have personally reviewed following labs and imaging studies  CBC: Recent Labs  Lab 03/09/18 1819  WBC 8.5  NEUTROABS 6.3  HGB 13.5  HCT 41.1  MCV 95.6  PLT 671   Basic Metabolic Panel: Recent Labs  Lab 03/09/18 1819  NA 138  K 5.0  CL 99*  CO2 30  GLUCOSE 127*  BUN 17  CREATININE 1.11  CALCIUM 10.1   GFR: CrCl cannot be calculated (Unknown ideal weight.). Liver Function Tests: Recent Labs  Lab 03/09/18 1819  AST 27  ALT 15*  ALKPHOS 94  BILITOT 1.2  PROT 5.6*  ALBUMIN 3.2*   No results for input(s): LIPASE, AMYLASE in the last 168 hours. No  results for input(s): AMMONIA in the last 168 hours. Coagulation Profile: No results for input(s): INR, PROTIME in the last 168 hours. Cardiac Enzymes: No results for input(s): CKTOTAL, CKMB, CKMBINDEX, TROPONINI in the last 168 hours. BNP (last 3 results) No results for input(s): PROBNP in the last 8760 hours. HbA1C: No results for input(s): HGBA1C in the last 72 hours. CBG: No results for input(s): GLUCAP in the last 168 hours. Lipid Profile: No results for input(s): CHOL, HDL, LDLCALC, TRIG, CHOLHDL, LDLDIRECT in the last 72 hours. Thyroid Function Tests: No results for input(s): TSH, T4TOTAL, FREET4, T3FREE, THYROIDAB in the last 72 hours. Anemia Panel: No results for input(s): VITAMINB12, FOLATE, FERRITIN, TIBC, IRON, RETICCTPCT in the last 72  hours. Urine analysis:    Component Value Date/Time   COLORURINE YELLOW 11/18/2016 [place] 11/18/2016 1534   LABSPEC 1.014 11/18/2016 1534   PHURINE 5.0 11/18/2016 1534   GLUCOSEU NEGATIVE 11/18/2016 1534   HGBUR NEGATIVE 11/18/2016 [place] 11/18/2016 1534   KETONESUR NEGATIVE 11/18/2016 1534   PROTEINUR NEGATIVE 11/18/2016 1534   UROBILINOGEN 1.0 10/20/2012 0016   NITRITE NEGATIVE 11/18/2016 1534   LEUKOCYTESUR NEGATIVE 11/18/2016 1534    Radiological Exams on Admission: Ct Abdomen Pelvis W Contrast  Result Date: 03/09/2018 CLINICAL DATA:  Fall, right flank bruising. EXAM: CT ABDOMEN AND PELVIS WITH CONTRAST TECHNIQUE: Multidetector CT imaging of the abdomen and pelvis was performed using the standard protocol following bolus administration of intravenous contrast. CONTRAST:  117mL OMNIPAQUE IOHEXOL 300 MG/ML  SOLN COMPARISON:  02/13/2017 FINDINGS: Lower chest: Right base atelectasis or scarring. No effusions. Heart is normal size. Hepatobiliary: Low-density lesion in the left hepatic lobe is again noted measuring up to 2.2 cm, similar to prior study. No other focal hepatic abnormality. Prior  cholecystectomy. Pancreas: No focal abnormality or ductal dilatation. Spleen: No focal abnormality.  Normal size. Adrenals/Urinary Tract: Nonobstructing left renal stones noted. No hydronephrosis. Exophytic cyst off the midpole of the left kidney measures 2.6 cm. New low-density lesion noted anteriorly within the superior pole on image 38 of series 3 measuring 2.0 cm. This appears to have areas of internal enhancement. This was not definitively seen on prior study. Stomach/Bowel: Left colonic diverticulosis. No active diverticulitis. No evidence of bowel obstruction. Vascular/Lymphatic: Diffuse aortic and iliac calcifications. No aneurysm or adenopathy. Reproductive: No visible focal abnormality. Other: No free fluid or free air. Large left inguinal hernia containing fat. Musculoskeletal: Displaced fracture noted through the right iliac bone as seen on prior pelvic plain films. Intramuscular hematoma within the right gluteal muscles. IMPRESSION: Displaced mildly comminuted fracture through the right iliac bone. Intramuscular hematoma within the right gluteal muscles. Left colonic diverticulosis. New 2 cm low-density lesion within the superior pole of the left kidney which was not present on prior study and appears to be solid with areas of enhancement. This is concerning for possible renal neoplasm. Recommend further evaluation with MRI. Advanced atherosclerotic change within the aorta and iliac vessels. Large left inguinal hernia containing fat. Electronically Signed   By: Rolm Baptise M.D.   On: 03/09/2018 20:33   Dg Hip Unilat  With Pelvis 2-3 Views Right  Result Date: 03/09/2018 CLINICAL DATA:  Fall onto right hip.  Right leg pain EXAM: DG HIP (WITH OR WITHOUT PELVIS) 2-3V RIGHT COMPARISON:  None. FINDINGS: No proximal femoral fracture. There appears to be a fracture of the right iliac crest with displacement. SI joints appear symmetric and intact. Early degenerative changes in the hips bilaterally.  IMPRESSION: Displaced fracture noted through the right iliac crest. No proximal femoral fracture/abnormality. Early degenerative changes in the hips. Electronically Signed   By: Rolm Baptise M.D.   On: 03/09/2018 17:49    Assessment/Plan  Principal Problem:   Ambulatory dysfunction Active Problems:   Renal mass   Closed fracture of iliac crest, right, initial encounter ([place])   Fall at home, initial encounter   Right hip pain  Ambulatory dysfunction due mechanical fall resulting in right iliac crest fracture.  Orthopedics was consulted from the ED and recommended conservative treatment.  Patient is from home and is unable to bear weight.  Will focus on pain relief.  Will get physical therapy, occupational therapy evaluation.  Intramuscular hematoma from fall.  Avoid heparin and aspirin for now.  Watch for hemoglobin.  Incidental left superior pole kidney masses.  This could be addressed as outpatient.  At his age, aggressive treatment is less likely to be beneficial.  History of hypertension.  Patient is on lisinopril hydrochlorthiazide as outpatient.  Continue with that.  Hyperlipidemia.  Continue statins.  * I certify that at the point of admission it is my clinical judgment that the patient will require inpatient hospital care spanning beyond 2 midnights from the point of admission due to high intensity of service, high risk for further deterioration and high frequency of surveillance required.*    DVT prophylaxis: SCD due to hematoma, consider heparin if hemoglobin remains stable  Code Status: Full code  Family Communication: Spoke with the patient's son at bedside and updated him about the clinical condition of the patient and plan for admission.  Patient and the patient's family is okay with rehab if needed  Consults called: Orthopedics   Flora Lipps MD [facility] Pager [phone]  If 7PM-7AM, please contact night-coverage [email or web address] Password  [facility]  03/09/2018, 9:24 PM

## 2018-03-09 NOTE — ED Triage Notes (Signed)
Onset 3 days ago pt reports tripping over bedroom shoe and fell on right hip.  Unable to bear full weight on right leg.

## 2018-03-09 NOTE — ED Notes (Signed)
[place] sandwich sprite, graham crackers given

## 2018-03-10 DIAGNOSIS — W19XXXA Unspecified fall, initial encounter: Secondary | ICD-10-CM

## 2018-03-10 DIAGNOSIS — N2889 Other specified disorders of kidney and ureter: Secondary | ICD-10-CM | POA: Diagnosis not present

## 2018-03-10 DIAGNOSIS — S32301A Unspecified fracture of right ilium, initial encounter for closed fracture: Secondary | ICD-10-CM

## 2018-03-10 DIAGNOSIS — Y92009 Unspecified place in unspecified non-institutional (private) residence as the place of occurrence of the external cause: Secondary | ICD-10-CM

## 2018-03-10 DIAGNOSIS — R262 Difficulty in walking, not elsewhere classified: Secondary | ICD-10-CM | POA: Diagnosis not present

## 2018-03-10 LAB — BASIC METABOLIC PANEL
Anion gap: 10 (ref 5–15)
BUN: 16 mg/dL (ref 6–20)
CO2: 26 mmol/L (ref 22–32)
Calcium: 9 mg/dL (ref 8.9–10.3)
Chloride: 100 mmol/L — ABNORMAL LOW (ref 101–111)
Creatinine, Ser: 1.02 mg/dL (ref 0.61–1.24)
GFR calc Af Amer: >60 mL/min (ref >60)
GFR calc non Af Amer: >60 mL/min (ref >60)
Glucose, Bld: 115 mg/dL — ABNORMAL HIGH (ref 65–99)
Potassium: 4 mmol/L (ref 3.5–5.1)
Sodium: 136 mmol/L (ref 135–145)

## 2018-03-10 LAB — CBC
HCT: 37.4 % — ABNORMAL LOW (ref 39.0–52.0)
Hemoglobin: 12.4 g/dL — ABNORMAL LOW (ref 13.0–17.0)
MCH: 31.7 pg (ref 26.0–34.0)
MCHC: 33.2 g/dL (ref 30.0–36.0)
MCV: 95.7 fL (ref 78.0–100.0)
Platelets: 201 10*3/uL (ref 150–400)
RBC: 3.91 MIL/uL — ABNORMAL LOW (ref 4.22–5.81)
RDW: 12.5 % (ref 11.5–15.5)
WBC: 7.9 10*3/uL (ref 4.0–10.5)

## 2018-03-10 NOTE — Plan of Care (Signed)
  Problem: Activity: Goal: Risk for activity intolerance will decrease Outcome: Progressing   Problem: Coping: Goal: Level of anxiety will decrease Outcome: Progressing   Problem: Elimination: Goal: Will not experience complications related to urinary retention Outcome: Progressing   

## 2018-03-10 NOTE — Consult Note (Signed)
ORTHOPAEDIC CONSULTATION  REQUESTING PHYSICIAN: Geradine Girt, DO  Chief Complaint: Right hip pain after a fall approximately 4 days ago  HPI: Ryan Tyler is a 84 y.o. male who complains of right hip pain.  He fell in his home while wearing wool socks on hardwood floors about 4 days ago.  He initially had severe pain which gradually subsided but became worse and impacted his ability to bear weight and mobilize.  He presented to the emergency department where x-rays showed a displaced right iliac fracture.  Orthopedics was consulted for evaluation.  Today his pain is controlled.  He denies numbness or paresthesias.  He was previously ambulatory.  The patient was living in his home.  His son lives with him.  Past Medical History:  Diagnosis Date  . Asymptomatic cholelithiasis   . Bladder tumor   . BPH (benign prostatic hypertrophy)   . Cancer ([place])    bladder  . GERD (gastroesophageal reflux disease)   . Hearing loss   . HOH (hard of hearing)    no hearing aids  . Hyperlipidemia   . Hypertension   . Kidney stones   . Mild left inguinal hernia   . Nocturia   . Urgency of urination    Past Surgical History:  Procedure Laterality Date  . CATARACT EXTRACTION W/ INTRAOCULAR LENS IMPLANT Left nov  2014  . CHOLECYSTECTOMY N/A 08/16/2015   Procedure: LAPAROSCOPIC CHOLECYSTECTOMY;  Surgeon: Rolm Bookbinder, MD;  Location: [facility];  Service: General;  Laterality: N/A;  . TONSILLECTOMY    . TRANSURETHRAL RESECTION OF BLADDER TUMOR WITH GYRUS (TURBT-GYRUS) N/A 04/05/2014   Procedure: TRANSURETHRAL RESECTION OF BLADDER TUMOR WITH GYRUS  AND INTRAVESICO CHEMO;  Surgeon: Claybon Jabs, MD;  Location: [facility];  Service: Urology;  Laterality: N/A;  . TRANSURETHRAL RESECTION OF PROSTATE  1995   Social History   Socioeconomic History  . Marital status: Widowed    Spouse name: Not on file  . Number of children: Not on file  . Years of education: Not on file  .  Highest education level: Not on file  Occupational History  . Not on file  Social Needs  . Financial resource strain: Not on file  . Food insecurity:    Worry: Patient refused    Inability: Patient refused  . Transportation needs:    Medical: Not on file    Non-medical: Not on file  Tobacco Use  . Smoking status: Never Smoker  . Smokeless tobacco: Former Systems developer    Types: Chew  Substance and Sexual Activity  . Alcohol use: Yes    Comment: 1 pint liquor most days.    . Drug use: No  . Sexual activity: Not on file  Lifestyle  . Physical activity:    Days per week: Not on file    Minutes per session: Not on file  . Stress: Not on file  Relationships  . Social connections:    Talks on phone: Not on file    Gets together: Not on file    Attends religious service: Not on file    Active member of club or organization: Not on file    Attends meetings of clubs or organizations: Not on file    Relationship status: Not on file  Other Topics Concern  . Not on file  Social History Narrative  . Not on file   Family History  Problem Relation Age of Onset  . Cancer Mother   .  Leukemia Other    No Known Allergies Prior to Admission medications   Medication Sig Start Date End Date Taking? Authorizing Provider  aspirin EC 81 MG tablet Take 81 mg by mouth daily.   Yes [provider]  Cholecalciferol (VITAMIN D3 PO) Take 1 tablet by mouth daily.   Yes [provider]  lisinopril-hydrochlorothiazide (PRINZIDE,ZESTORETIC) 20-25 MG per tablet Take 1 tablet by mouth daily.    Yes [provider]  naproxen sodium (ALEVE) 220 MG tablet Take 220 mg by mouth daily as needed (pain).   Yes [provider]   Ct Abdomen Pelvis W Contrast  Result Date: 03/09/2018 CLINICAL DATA:  Fall, right flank bruising. EXAM: CT ABDOMEN AND PELVIS WITH CONTRAST TECHNIQUE: Multidetector CT imaging of the abdomen and pelvis was performed using the standard protocol following  bolus administration of intravenous contrast. CONTRAST:  136m OMNIPAQUE IOHEXOL 300 MG/ML  SOLN COMPARISON:  02/13/2017 FINDINGS: Lower chest: Right base atelectasis or scarring. No effusions. Heart is normal size. Hepatobiliary: Low-density lesion in the left hepatic lobe is again noted measuring up to 2.2 cm, similar to prior study. No other focal hepatic abnormality. Prior cholecystectomy. Pancreas: No focal abnormality or ductal dilatation. Spleen: No focal abnormality.  Normal size. Adrenals/Urinary Tract: Nonobstructing left renal stones noted. No hydronephrosis. Exophytic cyst off the midpole of the left kidney measures 2.6 cm. New low-density lesion noted anteriorly within the superior pole on image 38 of series 3 measuring 2.0 cm. This appears to have areas of internal enhancement. This was not definitively seen on prior study. Stomach/Bowel: Left colonic diverticulosis. No active diverticulitis. No evidence of bowel obstruction. Vascular/Lymphatic: Diffuse aortic and iliac calcifications. No aneurysm or adenopathy. Reproductive: No visible focal abnormality. Other: No free fluid or free air. Large left inguinal hernia containing fat. Musculoskeletal: Displaced fracture noted through the right iliac bone as seen on prior pelvic plain films. Intramuscular hematoma within the right gluteal muscles. IMPRESSION: Displaced mildly comminuted fracture through the right iliac bone. Intramuscular hematoma within the right gluteal muscles. Left colonic diverticulosis. New 2 cm low-density lesion within the superior pole of the left kidney which was not present on prior study and appears to be solid with areas of enhancement. This is concerning for possible renal neoplasm. Recommend further evaluation with MRI. Advanced atherosclerotic change within the aorta and iliac vessels. Large left inguinal hernia containing fat. Electronically Signed   By: KRolm BaptiseM.D.   On: 03/09/2018 20:33   Dg Hip Unilat  With  Pelvis 2-3 Views Right  Result Date: 03/09/2018 CLINICAL DATA:  Fall onto right hip.  Right leg pain EXAM: DG HIP (WITH OR WITHOUT PELVIS) 2-3V RIGHT COMPARISON:  None. FINDINGS: No proximal femoral fracture. There appears to be a fracture of the right iliac crest with displacement. SI joints appear symmetric and intact. Early degenerative changes in the hips bilaterally. IMPRESSION: Displaced fracture noted through the right iliac crest. No proximal femoral fracture/abnormality. Early degenerative changes in the hips. Electronically Signed   By: KRolm BaptiseM.D.   On: 03/09/2018 17:49    Positive ROS: All other systems have been reviewed and were otherwise negative with the exception of those mentioned in the HPI and as above.  Objective: Labs cbc Recent Labs    03/09/18 1819 03/10/18 0425  WBC 8.5 7.9  HGB 13.5 12.4*  HCT 41.1 37.4*  PLT 227 201    Labs inflam No results for input(s): CRP in the last 72 hours.  Invalid input(s): ESR  Labs coag No results for input(s): INR, PTT in the last 72 hours.  Invalid input(s): PT  Recent Labs    03/09/18 1819 03/10/18 0425  NA 138 136  K 5.0 4.0  CL 99* 100*  CO2 30 26  GLUCOSE 127* 115*  BUN 17 16  CREATININE 1.11 1.02  CALCIUM 10.1 9.0    Physical Exam: Vitals:   03/09/18 2245 03/10/18 0015  BP: (!) 156/69 132/75  Pulse: 74 78  Resp: (!) 23 16  Temp:    SpO2: 96% 97%   General: Alert, no acute distress.  Upright in bed eating breakfast.  Calm, conversant.  Somewhat hard of hearing. Mental status: Alert and Oriented x3 Neurologic: Speech Clear and organized, no gross focal findings or movement disorder appreciated. Respiratory: No cyanosis, no use of accessory musculature Cardiovascular: No pedal edema GI: Abdomen is soft and non-tender, non-distended. Skin: Warm and dry.  No lesions in the area of chief complaint . Extremities: Warm and well perfused w/o edema Psychiatric: Patient is competent for consent with  normal mood and affect  MUSCULOSKELETAL:  Right lateral hip thigh and flank with significant ecchymosis but without lesion.  He is tender to palpation over this area.  He can do some small arc range of motion with the hip without groin pain.  EHL FHL dorsiflexion plantarflexion intact distally.  Sensation intact distally. Other extremities are atraumatic with painless ROM and NVI.  Assessment / Plan: Principal Problem:   Ambulatory dysfunction Active Problems:   Renal mass   Closed fracture of iliac crest, right, initial encounter ([place])   Fall at home, initial encounter   Right hip pain   Fall   Right iliac fracture Nonoperative management Weightbearing as tolerated Mobilize with therapies and set up with a DME  Discharge when mobilized and Follow up in the office with Dr. Alain Marion in 2 weeks.  Please call with questions  Prudencio Burly III PA-C 03/10/2018 8:08 AM

## 2018-03-10 NOTE — Care Management Obs Status (Signed)
[facility] NOTIFICATION   Patient Details  Name: Ryan Tyler MRN: [id number] Date of Birth: 1928/05/15   Medicare Observation Status Notification Given:       Marilu Favre, RN 03/10/2018, 2:29 PM

## 2018-03-10 NOTE — NC FL2 (Signed)
  [place] LEVEL OF CARE SCREENING TOOL     IDENTIFICATION  Patient Name: Ryan Tyler Birthdate: 25-Jun-1928 Sex: male Admission Date (Current Location): 03/09/2018  [facility] and [place] Number:  Herbalist and Address:  [facility], [place] [address] [place], [place] [address]      Provider Number: [id number]  Attending Physician Name and Address:  Geradine Girt, DO  Relative Name and Phone Number:  Artur Winningham III, [phone]    Current Level of Care: Hospital Recommended Level of Care: [place] Prior Approval Number:    Date Approved/Denied:   PASRR Number: [id number]  Discharge Plan: SNF    Current Diagnoses: Patient Active Problem List   Diagnosis Date Noted  . Ambulatory dysfunction 03/09/2018  . Renal mass 03/09/2018  . Closed fracture of iliac crest, right, initial encounter ([place]) 03/09/2018  . Fall at home, initial encounter 03/09/2018  . Right hip pain 03/09/2018  . Fall 03/09/2018  . S/P laparoscopic cholecystectomy 08/16/2015    Orientation RESPIRATION BLADDER Height & Weight     Time, Self, Situation, Place  Normal Continent, External catheter Weight: 183 lb (83 kg) Height:  6' (182.9 cm)  BEHAVIORAL SYMPTOMS/MOOD NEUROLOGICAL BOWEL NUTRITION STATUS      Continent Diet(see discharge summary)  AMBULATORY STATUS COMMUNICATION OF NEEDS Skin   Limited Assist Verbally Normal                       Personal Care Assistance Level of Assistance  Bathing, Feeding Bathing Assistance: Limited assistance Feeding assistance: Independent Dressing Assistance: Limited assistance     Functional Limitations Info  Sight, Hearing, Speech Sight Info: Adequate Hearing Info: Adequate Speech Info: Adequate    SPECIAL CARE FACTORS FREQUENCY  PT (By licensed PT), OT (By licensed OT)     PT Frequency: 5x week OT Frequency: 5x week            Contractures Contractures Info: Not present     Additional Factors Info  Code Status, Allergies Code Status Info: Full Code Allergies Info: No Known Allergies           Current Medications (03/10/2018):  This is the current hospital active medication list Current Facility-Administered Medications  Medication Dose Route Frequency Provider Last Rate Last Dose  . acetaminophen (TYLENOL) tablet 650 mg  650 mg Oral Q6H PRN Pokhrel, Laxman, MD       Or  . acetaminophen (TYLENOL) suppository 650 mg  650 mg Rectal Q6H PRN Pokhrel, Laxman, MD      . lisinopril (PRINIVIL,ZESTRIL) tablet 20 mg  20 mg Oral Daily Pokhrel, Laxman, MD   20 mg at 03/10/18 1057   And  . hydrochlorothiazide (HYDRODIURIL) tablet 25 mg  25 mg Oral Daily Pokhrel, Laxman, MD   25 mg at 03/10/18 1057  . HYDROcodone-acetaminophen (NORCO/VICODIN) 5-325 MG per tablet 1-2 tablet  1-2 tablet Oral Q4H PRN Pokhrel, Laxman, MD      . senna-docusate (Senokot-S) tablet 1 tablet  1 tablet Oral QHS PRN Pokhrel, Laxman, MD         Discharge Medications: Please see discharge summary for a list of discharge medications.  Relevant Imaging Results:  Relevant Lab Results:   Additional Information [id number] [place], [place]

## 2018-03-10 NOTE — Progress Notes (Signed)
Progress Note    Ryan Tyler  XKG:[id number] DOB: Nov 03, 1927  DOA: 03/09/2018 PCP: Josetta Huddle, MD    Brief Narrative:    Medical records reviewed and are as summarized below:  Ryan Tyler is an 84 y.o. male with medical history significant of bladder tumor, hypertension, BPH, GERD, hyperlipidemia presented to the hospital today with complaints of hip pain and ambulatory dysfunction.  Patient had a fall recently and landed on the right hip.  Patient stated that he was wearing or socks on the hardwood floor when he fell. Found to have non-operative fracture of hip.  Plan is for SNF.      Assessment/Plan:   Principal Problem:   Ambulatory dysfunction Active Problems:   Renal mass   Closed fracture of iliac crest, right, initial encounter ([place])   Fall at home, initial encounter   Right hip pain   Fall   mechanical fall resulting in right iliac crest fracture.  -Pain control PT eval- SNF -OT eval pending -seen by ortho: non-op, WBAT, mobilize, follow up with [facility]. [facility] [place] in 2 weeks  Intramuscular hematoma from fall. -hold ASA -trend h/h  History of hypertension. -resume home meds  Hyperlipidemia -statin  Left renal mass -MRI outpatient -not sure with age he would be a candidate for intervention   Body mass index is 24.82 kg/m.   Family Communication/Anticipated D/C date and plan/Code Status   DVT prophylaxis: SCD Code Status: Full Code.  Family Communication:  Disposition Plan:    Medical Consultants:    ortho   Subjective:   Asking about his BP meds and when he will get them  Objective:    Vitals:   03/09/18 2245 03/10/18 0015 03/10/18 0810 03/10/18 1057  BP: (!) 156/69 132/75 (!) 143/65 136/73  Pulse: 74 78 76 67  Resp: (!) 23 16 20 16   Temp:   97.9 F (36.6 C) 98 F (36.7 C)  TempSrc:   Oral Oral  SpO2: 96% 97% 96% 98%  Weight:    83 kg (183 lb)  Height:    6' (1.829 m)    Intake/Output Summary (Last 24 hours) at  03/10/2018 1304 Last data filed at 03/10/2018 1010 Gross per 24 hour  Intake 1500 ml  Output 500 ml  Net 1000 ml   Filed Weights   03/10/18 1057  Weight: 83 kg (183 lb)    Exam: In bed, NAD Very hard of hearing Clear, no increased work of breathing rrr +BS, soft  Data Reviewed:   I have personally reviewed following labs and imaging studies:  Labs: Labs show the following:   Basic Metabolic Panel: Recent Labs  Lab 03/09/18 1819 03/10/18 0425  NA 138 136  K 5.0 4.0  CL 99* 100*  CO2 30 26  GLUCOSE 127* 115*  BUN 17 16  CREATININE 1.11 1.02  CALCIUM 10.1 9.0   GFR Estimated Creatinine Clearance: 54.9 mL/min (by C-G formula based on SCr of 1.02 mg/dL). Liver Function Tests: Recent Labs  Lab 03/09/18 1819  AST 27  ALT 15*  ALKPHOS 94  BILITOT 1.2  PROT 5.6*  ALBUMIN 3.2*   No results for input(s): LIPASE, AMYLASE in the last 168 hours. No results for input(s): AMMONIA in the last 168 hours. Coagulation profile No results for input(s): INR, PROTIME in the last 168 hours.  CBC: Recent Labs  Lab 03/09/18 1819 03/10/18 0425  WBC 8.5 7.9  NEUTROABS 6.3  --   HGB 13.5 12.4*  HCT 41.1  37.4*  MCV 95.6 95.7  PLT 227 201   Cardiac Enzymes: No results for input(s): CKTOTAL, CKMB, CKMBINDEX, TROPONINI in the last 168 hours. BNP (last 3 results) No results for input(s): PROBNP in the last 8760 hours. CBG: No results for input(s): GLUCAP in the last 168 hours. D-Dimer: No results for input(s): DDIMER in the last 72 hours. Hgb A1c: No results for input(s): HGBA1C in the last 72 hours. Lipid Profile: No results for input(s): CHOL, HDL, LDLCALC, TRIG, CHOLHDL, LDLDIRECT in the last 72 hours. Thyroid function studies: No results for input(s): TSH, T4TOTAL, T3FREE, THYROIDAB in the last 72 hours.  Invalid input(s): FREET3 Anemia work up: No results for input(s): VITAMINB12, FOLATE, FERRITIN, TIBC, IRON, RETICCTPCT in the last 72 hours. Sepsis  Labs: Recent Labs  Lab 03/09/18 1819 03/10/18 0425  WBC 8.5 7.9    Microbiology No results found for this or any previous visit (from the past 240 hour(s)).  Procedures and diagnostic studies:  Ct Abdomen Pelvis W Contrast  Result Date: 03/09/2018 CLINICAL DATA:  Fall, right flank bruising. EXAM: CT ABDOMEN AND PELVIS WITH CONTRAST TECHNIQUE: Multidetector CT imaging of the abdomen and pelvis was performed using the standard protocol following bolus administration of intravenous contrast. CONTRAST:  139mL OMNIPAQUE IOHEXOL 300 MG/ML  SOLN COMPARISON:  02/13/2017 FINDINGS: Lower chest: Right base atelectasis or scarring. No effusions. Heart is normal size. Hepatobiliary: Low-density lesion in the left hepatic lobe is again noted measuring up to 2.2 cm, similar to prior study. No other focal hepatic abnormality. Prior cholecystectomy. Pancreas: No focal abnormality or ductal dilatation. Spleen: No focal abnormality.  Normal size. Adrenals/Urinary Tract: Nonobstructing left renal stones noted. No hydronephrosis. Exophytic cyst off the midpole of the left kidney measures 2.6 cm. New low-density lesion noted anteriorly within the superior pole on image 38 of series 3 measuring 2.0 cm. This appears to have areas of internal enhancement. This was not definitively seen on prior study. Stomach/Bowel: Left colonic diverticulosis. No active diverticulitis. No evidence of bowel obstruction. Vascular/Lymphatic: Diffuse aortic and iliac calcifications. No aneurysm or adenopathy. Reproductive: No visible focal abnormality. Other: No free fluid or free air. Large left inguinal hernia containing fat. Musculoskeletal: Displaced fracture noted through the right iliac bone as seen on prior pelvic plain films. Intramuscular hematoma within the right gluteal muscles. IMPRESSION: Displaced mildly comminuted fracture through the right iliac bone. Intramuscular hematoma within the right gluteal muscles. Left colonic  diverticulosis. New 2 cm low-density lesion within the superior pole of the left kidney which was not present on prior study and appears to be solid with areas of enhancement. This is concerning for possible renal neoplasm. Recommend further evaluation with MRI. Advanced atherosclerotic change within the aorta and iliac vessels. Large left inguinal hernia containing fat. Electronically Signed   By: Rolm Baptise M.D.   On: 03/09/2018 20:33   Dg Hip Unilat  With Pelvis 2-3 Views Right  Result Date: 03/09/2018 CLINICAL DATA:  Fall onto right hip.  Right leg pain EXAM: DG HIP (WITH OR WITHOUT PELVIS) 2-3V RIGHT COMPARISON:  None. FINDINGS: No proximal femoral fracture. There appears to be a fracture of the right iliac crest with displacement. SI joints appear symmetric and intact. Early degenerative changes in the hips bilaterally. IMPRESSION: Displaced fracture noted through the right iliac crest. No proximal femoral fracture/abnormality. Early degenerative changes in the hips. Electronically Signed   By: Rolm Baptise M.D.   On: 03/09/2018 17:49    Medications:   . lisinopril  20  mg Oral Daily   And  . hydrochlorothiazide  25 mg Oral Daily   Continuous Infusions:   Anti-infectives (From admission, onward)   None       LOS: 1 day   Geradine Girt DO  [facility]   *Please refer to [email or web address], password [email or web address] to get updated schedule on who will round on this patient, as hospitalists switch teams weekly. If 7PM-7AM, please contact night-coverage at [email or web address], password [email or web address] for any overnight needs.  03/10/2018, 1:04 PM

## 2018-03-10 NOTE — Evaluation (Signed)
Physical Therapy Evaluation Patient Details Name: Ryan Tyler MRN: [id number] DOB: 09/13/1928 Today's Date: 03/10/2018   History of Present Illness  Ryan Tyler is a 84 y.o. male with medical history significant of bladder tumor, hypertension, BPH, GERD, hyperlipidemia presented to the hospital today with complaints of hip pain and ambulatory dysfunction following a fall 4 days ago landing on R hip.  X-rays showed displaced R iliac fracture.  Orthopedics managing conservatively with pt WBAT.  Clinical Impression  Patient presents with decreased independence with mobility due to pain, decreased strength and is high fall risk.  Currently min A for short distance ambulation in the room with RW.  He reports son works 12 hours and he would be alone at home.  Recommend SNF level rehab at d/c.  PT to follow acutely.    Follow Up Recommendations SNF;Supervision/Assistance - 24 hour    Equipment Recommendations  Rolling walker with 5" wheels    Recommendations for Other Services       Precautions / Restrictions Precautions Precautions: Fall Restrictions Other Position/Activity Restrictions: WBAT R LE      Mobility  Bed Mobility Overal bed mobility: Needs Assistance Bed Mobility: Supine to Sit     Supine to sit: Supervision;HOB elevated     General bed mobility comments: increased time, cues for technique, helps R leg out to EOB  Transfers Overall transfer level: Needs assistance Equipment used: Rolling walker (2 wheeled) Transfers: Sit to/from Stand           General transfer comment: cues for hand placement, assist for safety  Ambulation/Gait Ambulation/Gait assistance: Min assist;Min guard Ambulation Distance (Feet): 30 Feet Assistive device: Rolling walker (2 wheeled) Gait Pattern/deviations: Step-through pattern     General Gait Details: cues for technique and to shorten step length as he wanted to move walker with each step.   Stairs             Wheelchair Mobility    Modified Rankin (Stroke Patients Only)       Balance Overall balance assessment: Needs assistance   Sitting balance-Leahy Scale: Good       Standing balance-Leahy Scale: Fair Standing balance comment: can stand without UE support, but needs walker for ambulation                             Pertinent Vitals/Pain Pain Assessment: Faces Faces Pain Scale: Hurts even more Pain Location: R hip with weight bearing, no pain at rest Pain Descriptors / Indicators: Grimacing;Guarding Pain Intervention(s): Limited activity within patient's tolerance;Repositioned;Monitored during session    Home Living Family/patient expects to be discharged to:: Private residence Living Arrangements: Children Available Help at Discharge: Family;Available PRN/intermittently Type of Home: House Home Access: Stairs to enter Entrance Stairs-Rails: Right;Left Entrance Stairs-Number of Steps: 8 Home Layout: Two level;Able to live on main level with bedroom/bathroom Home Equipment: None;Grab bars - tub/shower      Prior Function Level of Independence: Independent         Comments: does not drive or cook     Hand Dominance        Extremity/Trunk Assessment   Upper Extremity Assessment Upper Extremity Assessment: Overall WFL for tasks assessed    Lower Extremity Assessment Lower Extremity Assessment: RLE deficits/detail RLE Deficits / Details: AROM grossly WFL, difficulty with hip flexion but holds up antigravity       Communication   Communication: HOH  Cognition Arousal/Alertness: Awake/alert Behavior During Therapy: [facility]  for tasks assessed/performed Overall Cognitive Status: No family/caregiver present to determine baseline cognitive functioning                                 General Comments: admits to forgetting things, but states manages own medications at home      General Comments      Exercises     Assessment/Plan     PT Assessment Patient needs continued PT services  PT Problem List Decreased strength;Decreased mobility;Decreased safety awareness;Decreased balance;Decreased knowledge of use of DME;Pain       PT Treatment Interventions DME instruction;Therapeutic activities;Gait training;Patient/family education;Therapeutic exercise;Balance training;Functional mobility training;Stair training    PT Goals (Current goals can be found in the Care Plan section)  Acute Rehab PT Goals Patient Stated Goal: agreeable to rehab PT Goal Formulation: With patient Time For Goal Achievement: 03/24/18 Potential to Achieve Goals: Good    Frequency Min 3X/week   Barriers to discharge        Co-evaluation               AM-PAC PT "6 Clicks" Daily Activity  Outcome Measure Difficulty turning over in bed (including adjusting bedclothes, sheets and blankets)?: A Little Difficulty moving from lying on back to sitting on the side of the bed? : A Lot Difficulty sitting down on and standing up from a chair with arms (e.g., wheelchair, bedside commode, etc,.)?: Unable Help needed moving to and from a bed to chair (including a wheelchair)?: A Little Help needed walking in hospital room?: A Little Help needed climbing 3-5 steps with a railing? : A Lot 6 Click Score: 14    End of Session Equipment Utilized During Treatment: Gait belt Activity Tolerance: Patient limited by pain Patient left: in chair;with call bell/phone within reach;with chair alarm set   PT Visit Diagnosis: Other abnormalities of gait and mobility (R26.89);History of falling (Z91.81);Pain Pain - Right/Left: Right Pain - part of body: Hip    Time: 2751-7001 PT Time Calculation (min) (ACUTE ONLY): 24 min   Charges:   PT Evaluation $PT Eval Moderate Complexity: 1 Mod PT Treatments $Gait Training: 8-22 mins   PT G CodesMagda Kiel, [place] [phone] 03/10/2018   Reginia Naas 03/10/2018, 11:26 AM

## 2018-03-10 NOTE — Social Work (Addendum)
CSW aware pt and pt son have discussed discharge. Pt son is not able to provide 24/7 supervision as he works 12 hour days.  Pt open to SNF. FL2 complete, CSW will initiate [employer] authorization.   3:15pm- Upon further assessment pt requesting to speak with son about SNF and would like CSW to f/u in the morning.   Alexander Mt, [place] Work [phone]

## 2018-03-10 NOTE — Care Management CC44 (Signed)
Condition Code 44 Documentation Completed  Patient Details  Name: CAN LUCCI MRN: [id number] Date of Birth: Jan 06, 1928   Condition Code 44 given:   yes  Patient signature on Condition Code 44 notice:   yes Documentation of 2 MD's agreement:   yes Code 44 added to claim:   yes    Marilu Favre, RN 03/10/2018, 2:29 PM

## 2018-03-10 NOTE — Evaluation (Signed)
Occupational Therapy Evaluation Patient Details Name: Ryan Tyler MRN: [id number] DOB: 03/02/1928 Today's Date: 03/10/2018    History of Present Illness Ryan Tyler is a 84 y.o. male with medical history significant of bladder tumor, hypertension, BPH, GERD, hyperlipidemia presented to the hospital today with complaints of hip pain and ambulatory dysfunction following a fall 4 days ago landing on R hip.  X-rays showed displaced R iliac fracture.  Orthopedics managing conservatively with pt WBAT.   Clinical Impression   Pt is typically independent in self care and walks without a device. He was using a rolling desk chair to move about from the time he fell until his son talked him into coming to the hospital. Pt requires set up to min assist for ADL and mobility with RW. He does not have 24 hour care at home. Recommending brief period of rehab in SNF prior to return home. Pt is reluctant, states he will speak to his son and decide what he wants to do. Will follow acutely.    Follow Up Recommendations  SNF    Equipment Recommendations  3 in 1 bedside commode(RW)    Recommendations for Other Services       Precautions / Restrictions Precautions Precautions: Fall Restrictions Other Position/Activity Restrictions: WBAT R LE      Mobility Bed Mobility Overal bed mobility: Needs Assistance Bed Mobility: Supine to Sit;Sit to Supine     Supine to sit: Supervision;HOB elevated Sit to supine: Min assist   General bed mobility comments: increased time, assisted R LE back into bed, pt self assisted R LE OOB  Transfers Overall transfer level: Needs assistance Equipment used: Rolling walker (2 wheeled) Transfers: Sit to/from Stand Sit to Stand: Min assist;Min guard         General transfer comment: cues for hand placement, assist for safety    Balance Overall balance assessment: Needs assistance   Sitting balance-Leahy Scale: Good       Standing balance-Leahy Scale:  Fair Standing balance comment: can stand without UE support, but needs walker for ambulation                           ADL either performed or assessed with clinical judgement   ADL Overall ADL's : Needs assistance/impaired Eating/Feeding: Independent;Sitting   Grooming: Wash/dry hands;Standing;Min guard   Upper Body Bathing: Set up;Sitting   Lower Body Bathing: Minimal assistance;Sit to/from stand   Upper Body Dressing : Set up;Sitting   Lower Body Dressing: Minimal assistance;Sit to/from stand   Toilet Transfer: Min Administrator, sports Details (indicate cue type and reason): educated in use of 3 in 1 over toilet Toileting- Water quality scientist and Hygiene: Min guard;Sit to/from Nurse, children's Details (indicate cue type and reason): recommended pt sit to shower Functional mobility during ADLs: Minimal assistance;Min guard;Rolling walker;Cueing for sequencing;Cueing for safety       Vision Baseline Vision/History: Wears glasses Wears Glasses: At all times Patient Visual Report: No change from baseline       Perception     Praxis      Pertinent Vitals/Pain Pain Assessment: Faces Faces Pain Scale: Hurts little more Pain Location: R hip with weight bearing, no pain at rest Pain Descriptors / Indicators: Grimacing;Guarding Pain Intervention(s): Repositioned;Monitored during session;Limited activity within patient's tolerance     Hand Dominance Right   Extremity/Trunk Assessment Upper Extremity Assessment Upper Extremity Assessment: Overall WFL for tasks assessed  Lower Extremity Assessment Lower Extremity Assessment: Defer to PT evaluation       Communication Communication Communication: HOH   Cognition Arousal/Alertness: Awake/alert Behavior During Therapy: WFL for tasks assessed/performed Overall Cognitive Status: No family/caregiver present to determine baseline cognitive functioning                                  General Comments: pt refused to come to hospital after fall, was rolling around his house in a desk chair for 4 days, pt with decreased safety awareness, reports some memory deficits at baseline   General Comments       Exercises     Shoulder Instructions      Home Living Family/patient expects to be discharged to:: Private residence Living Arrangements: Children Available Help at Discharge: Family;Available PRN/intermittently Type of Home: House Home Access: Stairs to enter [employer] of Steps: 8 Entrance Stairs-Rails: Right;Left Home Layout: Two level;Able to live on main level with bedroom/bathroom     Bathroom Shower/Tub: Teacher, early years/pre: Standard     Home Equipment: None;Grab bars - tub/shower          Prior Functioning/Environment Level of Independence: Independent        Comments: does not drive or cook        OT Problem List: Decreased activity tolerance;Impaired balance (sitting and/or standing);Decreased safety awareness;Decreased knowledge of use of DME or AE;Pain      OT Treatment/Interventions: Self-care/ADL training;DME and/or AE instruction;Therapeutic activities;Patient/family education;Balance training    OT Goals(Current goals can be found in the care plan section) Acute Rehab OT Goals Patient Stated Goal: prefers to go home OT Goal Formulation: With patient Time For Goal Achievement: 03/24/18 Potential to Achieve Goals: Good ADL Goals Pt Will Perform Grooming: with supervision;standing Pt Will Perform Lower Body Bathing: with supervision;sit to/from stand Pt Will Perform Lower Body Dressing: with supervision;sit to/from stand Pt Will Transfer to Toilet: with supervision;ambulating;bedside commode(over toilet) Pt Will Perform Toileting - Clothing Manipulation and hygiene: with supervision;sit to/from stand Additional ADL Goal #1: Pt will perform bed mobility modified  independently.  OT Frequency: Min 2X/week   Barriers to D/C: Decreased caregiver support          Co-evaluation              AM-PAC PT "6 Clicks" Daily Activity     Outcome Measure Help from another person eating meals?: None Help from another person taking care of personal grooming?: A Little Help from another person toileting, which includes using toliet, bedpan, or urinal?: A Little Help from another person bathing (including washing, rinsing, drying)?: A Little Help from another person to put on and taking off regular upper body clothing?: None Help from another person to put on and taking off regular lower body clothing?: A Little 6 Click Score: 20   End of Session Equipment Utilized During Treatment: Rolling walker;Gait belt  Activity Tolerance: Patient tolerated treatment well Patient left: in bed;with call bell/phone within reach  OT Visit Diagnosis: Other abnormalities of gait and mobility (R26.89);Unsteadiness on feet (R26.81);Pain;History of falling (Z91.81) Pain - Right/Left: Right Pain - part of body: Leg                Time: 1515-1540 OT Time Calculation (min): 25 min Charges:  OT General Charges $OT Visit: 1 Visit OT Evaluation $OT Eval Moderate Complexity: 1 Mod OT Treatments $Self Care/Home Management : 8-22 mins  G-Codes:     03/10/2018 Nestor Lewandowsky, OTR/L Pager: [phone]  Werner Lean Haze Boy[place] 03/10/2018, 4:01 PM

## 2018-03-10 NOTE — Clinical Social Work Note (Signed)
Clinical Social Work Assessment  Patient Details  Name: Ryan Tyler MRN: [id number] Date of Birth: 06-26-1928  Date of referral:  03/10/18               Reason for consult:  Facility Placement, Discharge Planning                Permission sought to share information with:  Facility Sport and exercise psychologist, Family Supports Permission granted to share information::  No(pt states he will speak with his son this evening)  Name::     Yechiel Erny III  Agency::     Relationship::  son  Contact Information:  [phone]  Housing/Transportation Living arrangements for the past 2 months:  [place] of Information:  Patient Patient Interpreter Needed:  None Criminal Activity/Legal Involvement Pertinent to Current Situation/Hospitalization:  No - Comment as needed Significant Relationships:  Adult Children Lives with:  Adult Children Do you feel safe going back to the place where you live?  Yes Need for family participation in patient care:  Yes (Comment)  Care giving concerns:  Pt lives with adult son, who works 12 hours a day and is unable to provide 24/7 assistance. Pt states that he consumes a pint of liquor a day.    Social Worker assessment / plan:  CSW met with pt at bedside, pt states that his son moved in to help him out and works Architect. Pt son works from 6am-5pm and is unable to take care of pt 24/7. Pt states that he feels safe in his home and that this fall was an accident. Pt states that he may want to go home, he needs to discuss placement with his son.  CSW explained now that pt is observation he does not have much time to decide discharge plans as he may receive a higher bill the longer that he stays, and he will only be able to stay so long. Pt states understanding, when CSW asked if she could contact pt son, pt verbally refused to let this Probation officer call son.   CSW and RN Case Manager will f/u with pt in the morning to discern disposition.   Employment  status:  Retired Nurse, adult PT Recommendations:  [place], [place] / Referral to community resources:  [place]  Patient/Family's Response to care:  Pt aware of CSW role, does not seem amenable fully to SNF placement.   Patient/Family's Understanding of and Emotional Response to Diagnosis, Current Treatment, and Prognosis:  Pt states understanding of his diagnosis, current treatment and prognosis. Pt seems to grasp recommendations but appears hesitant to decide on SNF and will not allow care team to contact his son on his behalf to discuss recommendations.   Emotional Assessment Appearance:  Appears younger than stated age Attitude/Demeanor/Rapport:  Self-Confident Affect (typically observed):  Blunt, Pleasant, Appropriate Orientation:  Oriented to Self, Oriented to Place, Oriented to  Time, Oriented to Situation Alcohol / Substance use:  Alcohol Use(1 pint of liquor most days) Psych involvement (Current and /or in the community):  No (Comment)  Discharge Needs  Concerns to be addressed:  Care Coordination, Discharge Planning Concerns Readmission within the last 30 days:  No Current discharge risk:  Physical Impairment, Dependent with Mobility, Substance Abuse Barriers to Discharge:  Ship broker, Continued Medical Work up   [employer], [place] 03/10/2018, 3:38 PM

## 2018-03-11 DIAGNOSIS — R262 Difficulty in walking, not elsewhere classified: Secondary | ICD-10-CM | POA: Diagnosis not present

## 2018-03-11 DIAGNOSIS — W19XXXA Unspecified fall, initial encounter: Secondary | ICD-10-CM | POA: Diagnosis not present

## 2018-03-11 DIAGNOSIS — N2889 Other specified disorders of kidney and ureter: Secondary | ICD-10-CM | POA: Diagnosis not present

## 2018-03-11 DIAGNOSIS — S32301A Unspecified fracture of right ilium, initial encounter for closed fracture: Secondary | ICD-10-CM | POA: Diagnosis not present

## 2018-03-11 LAB — BASIC METABOLIC PANEL
Anion gap: 9 (ref 5–15)
BUN: 14 mg/dL (ref 6–20)
CO2: 29 mmol/L (ref 22–32)
Calcium: 9.1 mg/dL (ref 8.9–10.3)
Chloride: 100 mmol/L — ABNORMAL LOW (ref 101–111)
Creatinine, Ser: 1.03 mg/dL (ref 0.61–1.24)
GFR calc Af Amer: >60 mL/min (ref >60)
GFR calc non Af Amer: >60 mL/min (ref >60)
Glucose, Bld: 103 mg/dL — ABNORMAL HIGH (ref 65–99)
Potassium: 3.9 mmol/L (ref 3.5–5.1)
Sodium: 138 mmol/L (ref 135–145)

## 2018-03-11 LAB — CBC
HCT: 39 % (ref 39.0–52.0)
Hemoglobin: 12.6 g/dL — ABNORMAL LOW (ref 13.0–17.0)
MCH: 31.3 pg (ref 26.0–34.0)
MCHC: 32.3 g/dL (ref 30.0–36.0)
MCV: 97 fL (ref 78.0–100.0)
Platelets: 223 10*3/uL (ref 150–400)
RBC: 4.02 MIL/uL — ABNORMAL LOW (ref 4.22–5.81)
RDW: 12.7 % (ref 11.5–15.5)
WBC: 6.1 10*3/uL (ref 4.0–10.5)

## 2018-03-11 MED ORDER — LORAZEPAM 2 MG/ML IJ SOLN: 1.0000 mg | Freq: Four times a day (QID) | INTRAMUSCULAR | Status: DC | PRN

## 2018-03-11 MED ORDER — ACETAMINOPHEN 325 MG PO TABS: 650.0000 mg | ORAL_TABLET | Freq: Four times a day (QID) | ORAL | Status: DC | PRN

## 2018-03-11 MED ORDER — VITAMIN B-1 100 MG PO TABS
100.0000 mg | ORAL_TABLET | Freq: Every day | ORAL | Status: DC
  Administered 2018-03-11: 100 mg via ORAL
  Filled 2018-03-11: qty 1

## 2018-03-11 MED ORDER — FOLIC ACID 1 MG PO TABS
1.0000 mg | ORAL_TABLET | Freq: Every day | ORAL | Status: DC
  Administered 2018-03-11: 1 mg via ORAL
  Filled 2018-03-11: qty 1

## 2018-03-11 MED ORDER — LORAZEPAM 1 MG PO TABS: 1.0000 mg | ORAL_TABLET | Freq: Four times a day (QID) | ORAL | Status: DC | PRN

## 2018-03-11 MED ORDER — ADULT MULTIVITAMIN W/MINERALS CH
1.0000 | ORAL_TABLET | Freq: Every day | ORAL | Status: DC
  Administered 2018-03-11: 1 via ORAL
  Filled 2018-03-11: qty 1

## 2018-03-11 MED ORDER — THIAMINE HCL 100 MG/ML IJ SOLN: 100.0000 mg | Freq: Every day | INTRAMUSCULAR | Status: DC

## 2018-03-11 NOTE — Discharge Summary (Signed)
Physician Discharge Summary  Ryan Tyler ZTI:[id number] DOB: 09-26-1928 DOA: 03/09/2018  PCP: Josetta Huddle, MD  Admit date: 03/09/2018 Discharge date: 03/11/2018  Admitted From: home Discharge disposition: home with 24 hour supervision by family/friends   Recommendations for Outpatient Follow-Up:   1. maxed out home health due to SNF refusal 2. Alcohol cessation 3. Follow up with ortho- Dr. Alain Marion 4. Consider MRI of renal mass   Discharge Diagnosis:   Principal Problem:   Ambulatory dysfunction Active Problems:   Renal mass   Closed fracture of iliac crest, right, initial encounter ([place])   Fall at home, initial encounter   Right hip pain   Fall    Discharge Condition: Improved.  Diet recommendation: Low sodium, heart healthy.   Wound care: None.  Code status: Full.   History of Present Illness:   Ryan Tyler is a 84 y.o. male with medical history significant of bladder tumor, hypertension, BPH, GERD, hyperlipidemia presented to the hospital today with complaints of hip pain and ambulatory dysfunction.  Patient had a fall recently and landed on the right hip.  Patient stated that he was wearing or socks on the hardwood floor when he fell.  He denies any dizziness lightheadedness chest pain or palpitation prior to the fall.  He initially did not want to come to the hospital and for the last 3 to 4 days he has been having pain over the flank and right hip usually on bearing weight is getting worse to the point where he was unable to bear weight today.  Patient's son then decided to bring him to the hospital.  Patient denies chest pain, palpitations or shortness of breath.  Denies cough, fever chills or Rigor.  Patient denies any urinary urgency, frequency or dysuria.  Denies hematuria.  Denies nausea vomiting or diarrhea.  ED Course: In the ED patient had a x-ray of the hip including the CT scan of the abdomen and pelvis.  This showed closed mildly displaced  fracture of the right iliac crest with intramuscular hematoma and incidental to lesions in the left kidney which were solid with enhancement.  Because of ambulatory dysfunction and pain, patient was considered for admission to the hospital.     Hospital Course by Problem:   mechanical fall resulting in right iliac crest fracture.  -no pain -seen by ortho: non-op, WBAT, mobilize, follow up with T. Percell Miller in 2 weeks  Intramuscular hematoma from fall. -stable  History of hypertension. -resume home meds  Hyperlipidemia -statin  Left renal mass -MRI outpatient -not sure with age he would be a candidate for intervention        Medical Consultants:   ortho   Discharge Exam:   Vitals:   03/11/18 0543 03/11/18 1217  BP: 132/68 138/80  Pulse: 73 89  Resp: 18 18  Temp: 98 F (36.7 C) 97.8 F (36.6 C)  SpO2: 97% 98%   Vitals:   03/10/18 1401 03/10/18 2140 03/11/18 0543 03/11/18 1217  BP: (!) 141/71 116/66 132/68 138/80  Pulse: 77 74 73 89  Resp:  19 18 18   Temp: 98.2 F (36.8 C) 97.8 F (36.6 C) 98 F (36.7 C) 97.8 F (36.6 C)  TempSrc: Oral Oral Oral Oral  SpO2: 100% 96% 97% 98%  Weight:      Height:        General exam: Appears calm and comfortable. HARD OF HEARING    The results of significant diagnostics from this hospitalization (including  imaging, microbiology, ancillary and laboratory) are listed below for reference.     Procedures and Diagnostic Studies:   Ct Abdomen Pelvis W Contrast  Result Date: 03/09/2018 CLINICAL DATA:  Fall, right flank bruising. EXAM: CT ABDOMEN AND PELVIS WITH CONTRAST TECHNIQUE: Multidetector CT imaging of the abdomen and pelvis was performed using the standard protocol following bolus administration of intravenous contrast. CONTRAST:  167mL OMNIPAQUE IOHEXOL 300 MG/ML  SOLN COMPARISON:  02/13/2017 FINDINGS: Lower chest: Right base atelectasis or scarring. No effusions. Heart is normal size. Hepatobiliary:  Low-density lesion in the left hepatic lobe is again noted measuring up to 2.2 cm, similar to prior study. No other focal hepatic abnormality. Prior cholecystectomy. Pancreas: No focal abnormality or ductal dilatation. Spleen: No focal abnormality.  Normal size. Adrenals/Urinary Tract: Nonobstructing left renal stones noted. No hydronephrosis. Exophytic cyst off the midpole of the left kidney measures 2.6 cm. New low-density lesion noted anteriorly within the superior pole on image 38 of series 3 measuring 2.0 cm. This appears to have areas of internal enhancement. This was not definitively seen on prior study. Stomach/Bowel: Left colonic diverticulosis. No active diverticulitis. No evidence of bowel obstruction. Vascular/Lymphatic: Diffuse aortic and iliac calcifications. No aneurysm or adenopathy. Reproductive: No visible focal abnormality. Other: No free fluid or free air. Large left inguinal hernia containing fat. Musculoskeletal: Displaced fracture noted through the right iliac bone as seen on prior pelvic plain films. Intramuscular hematoma within the right gluteal muscles. IMPRESSION: Displaced mildly comminuted fracture through the right iliac bone. Intramuscular hematoma within the right gluteal muscles. Left colonic diverticulosis. New 2 cm low-density lesion within the superior pole of the left kidney which was not present on prior study and appears to be solid with areas of enhancement. This is concerning for possible renal neoplasm. Recommend further evaluation with MRI. Advanced atherosclerotic change within the aorta and iliac vessels. Large left inguinal hernia containing fat. Electronically Signed   By: Rolm Baptise M.D.   On: 03/09/2018 20:33   Dg Hip Unilat  With Pelvis 2-3 Views Right  Result Date: 03/09/2018 CLINICAL DATA:  Fall onto right hip.  Right leg pain EXAM: DG HIP (WITH OR WITHOUT PELVIS) 2-3V RIGHT COMPARISON:  None. FINDINGS: No proximal femoral fracture. There appears to be a  fracture of the right iliac crest with displacement. SI joints appear symmetric and intact. Early degenerative changes in the hips bilaterally. IMPRESSION: Displaced fracture noted through the right iliac crest. No proximal femoral fracture/abnormality. Early degenerative changes in the hips. Electronically Signed   By: Rolm Baptise M.D.   On: 03/09/2018 17:49     Labs:   Basic Metabolic Panel: Recent Labs  Lab 03/09/18 1819 03/10/18 0425 03/11/18 0446  NA 138 136 138  K 5.0 4.0 3.9  CL 99* 100* 100*  CO2 30 26 29   GLUCOSE 127* 115* 103*  BUN 17 16 14   CREATININE 1.11 1.02 1.03  CALCIUM 10.1 9.0 9.1   GFR Estimated Creatinine Clearance: 54.4 mL/min (by C-G formula based on SCr of 1.03 mg/dL). Liver Function Tests: Recent Labs  Lab 03/09/18 1819  AST 27  ALT 15*  ALKPHOS 94  BILITOT 1.2  PROT 5.6*  ALBUMIN 3.2*   No results for input(s): LIPASE, AMYLASE in the last 168 hours. No results for input(s): AMMONIA in the last 168 hours. Coagulation profile No results for input(s): INR, PROTIME in the last 168 hours.  CBC: Recent Labs  Lab 03/09/18 1819 03/10/18 0425 03/11/18 0446  WBC 8.5 7.9 6.1  NEUTROABS 6.3  --   --   HGB 13.5 12.4* 12.6*  HCT 41.1 37.4* 39.0  MCV 95.6 95.7 97.0  PLT 227 201 223   Cardiac Enzymes: No results for input(s): CKTOTAL, CKMB, CKMBINDEX, TROPONINI in the last 168 hours. BNP: Invalid input(s): POCBNP CBG: No results for input(s): GLUCAP in the last 168 hours. D-Dimer No results for input(s): DDIMER in the last 72 hours. Hgb A1c No results for input(s): HGBA1C in the last 72 hours. Lipid Profile No results for input(s): CHOL, HDL, LDLCALC, TRIG, CHOLHDL, LDLDIRECT in the last 72 hours. Thyroid function studies No results for input(s): TSH, T4TOTAL, T3FREE, THYROIDAB in the last 72 hours.  Invalid input(s): FREET3 Anemia work up No results for input(s): VITAMINB12, FOLATE, FERRITIN, TIBC, IRON, RETICCTPCT in the last 72  hours. Microbiology No results found for this or any previous visit (from the past 240 hour(s)).   Discharge Instructions:    Allergies as of 03/11/2018   No Known Allergies     Medication List    TAKE these medications   acetaminophen 325 MG tablet Commonly known as:  TYLENOL Take 2 tablets (650 mg total) by mouth every 6 (six) hours as needed for mild pain (or Fever >/= 101).   ALEVE 220 MG tablet Generic drug:  naproxen sodium Take 220 mg by mouth daily as needed (pain).   aspirin EC 81 MG tablet Take 81 mg by mouth daily.   lisinopril-hydrochlorothiazide 20-25 MG tablet Commonly known as:  PRINZIDE,ZESTORETIC Take 1 tablet by mouth daily.   VITAMIN D3 PO Take 1 tablet by mouth daily.            Durable Medical Equipment  (From admission, onward)        Start     Ordered   03/11/18 1032  For home use only DME Walker rolling  Once    Question:  Patient needs a walker to treat with the following condition  Answer:  Closed fracture of iliac crest, right, initial encounter ([place])   03/11/18 [facility] Follow up.   Why:  Home health PT  Contact information: [place] [address] [phone]        Josetta Huddle, MD Follow up in 1 week(s).   Specialty:  Internal Medicine Contact information: [address] [place] [address] [phone]            Time coordinating discharge: 35 min  Signed:  Geradine Girt  [facility] 03/11/2018, 12:18 PM

## 2018-03-11 NOTE — Social Work (Addendum)
CSW met with pt at bedside, presented SNF offers. Pt states he wants "someone to come to my house." CSW will notify RN Case Manager and leave SNF offers at bedside in case of disposition plan.   CSW signing off. Please consult if any additional needs arise.  [place], [place] Work [phone]

## 2018-03-11 NOTE — Care Management Note (Signed)
Case Management Note  Patient Details  Name: LEWELLYN FULTZ MRN: [id number] Date of Birth: 29-Aug-1928  Subjective/Objective:                    Action/Plan:  Discussed PT recommendations with patient. Patient refusing SNF , agreeable to home health PT. Patient also agreeable to walker , not agreeable to 3 in1. Patient states his son will transport him home.  Expected Discharge Date:                  Expected Discharge Plan:  [place]  In-House Referral:  Clinical Social Work  Discharge planning Services  CM Consult  Post Acute Care Choice:  Home Health, Durable Medical Equipment Choice offered to:  Patient  DME Arranged:  Walker rolling DME Agency:  [place]:  PT [place]:  [facility]  Status of Service:  Completed, signed off  If discussed at [place] of Stay Meetings, dates discussed:    Additional Comments:  Marilu Favre, RN 03/11/2018, 10:25 AM

## 2018-03-11 NOTE — Progress Notes (Signed)
Patient discharged to home. Verbalizes understanding of all discharge instructions including discharge medications and follow up MD visits. Patient accompanied by son. Home health PT arranged for patient.

## 2018-03-11 NOTE — Care Management Obs Status (Signed)
[facility] NOTIFICATION   Patient Details  Name: Ryan Tyler MRN: [id number] Date of Birth: [birth year]   Medicare Observation Status Notification Given:  Yes   By Magdalen Spatz RN CM

## 2018-08-19 DIAGNOSIS — S060X9A Concussion with loss of consciousness of unspecified duration, initial encounter: Secondary | ICD-10-CM | POA: Diagnosis not present

## 2018-08-19 DIAGNOSIS — Z0001 Encounter for general adult medical examination with abnormal findings: Secondary | ICD-10-CM | POA: Diagnosis not present

## 2018-08-19 DIAGNOSIS — Z1389 Encounter for screening for other disorder: Secondary | ICD-10-CM | POA: Diagnosis not present

## 2018-08-19 DIAGNOSIS — H918X3 Other specified hearing loss, bilateral: Secondary | ICD-10-CM | POA: Diagnosis not present

## 2018-11-29 IMAGING — CT CT ABD-PELV W/ CM
2 of 5 series · 15 of 46 positions shown, 17 images · IV contrast (APPLIED)
Comparison: 02/13/2017

CLINICAL DATA: Fall, right flank bruising.

EXAM:
CT ABDOMEN AND PELVIS WITH CONTRAST
TECHNIQUE: Multidetector CT imaging of the abdomen and pelvis was performed
using the standard protocol following bolus administration of
intravenous contrast.
CONTRAST:  100mL OMNIPAQUE IOHEXOL 300 MG/ML  SOLN

[Series 3: abdomen 5.0 · axial · 0.84mm/px · z∈[+978,+1443]mm · 12 of 107 slices shown, 14 images]
[im 7/107  soft-tissue]
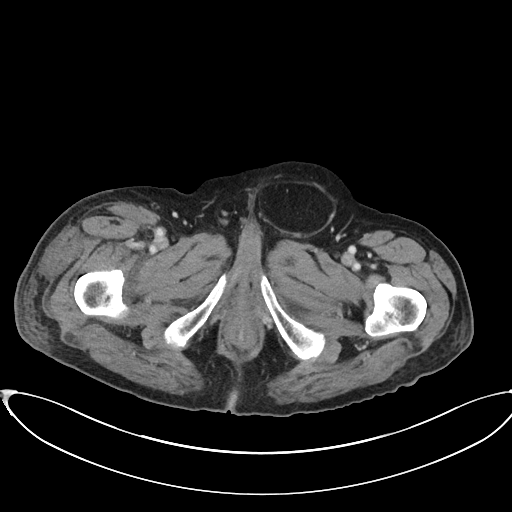
[im 7/107  bone]
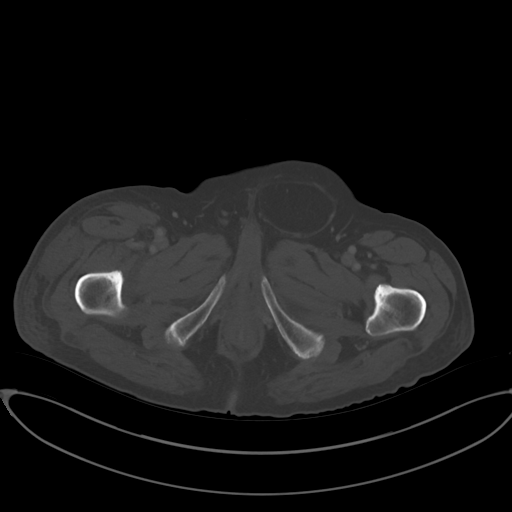
[im 14/107  soft-tissue]
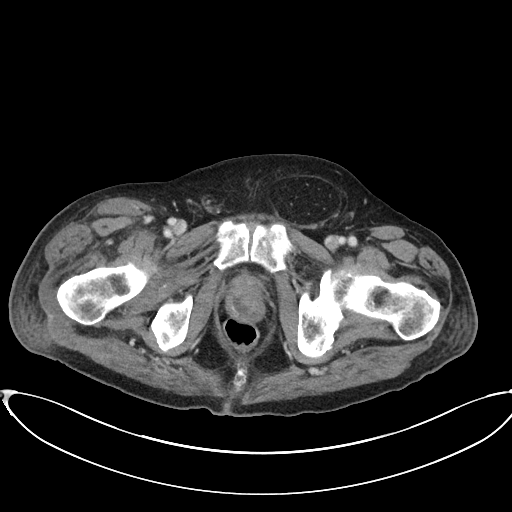
[im 27/107  soft-tissue]
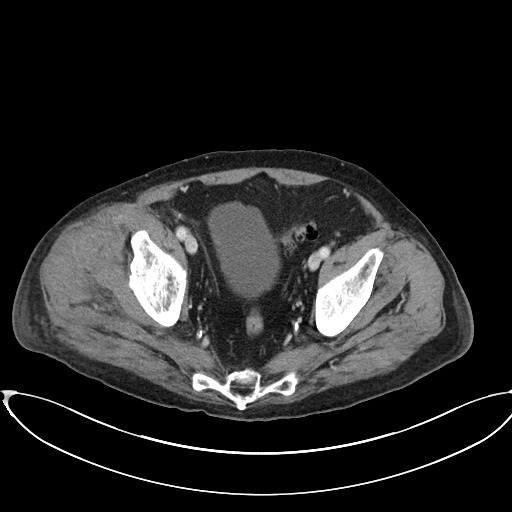
[im 34/107  soft-tissue]
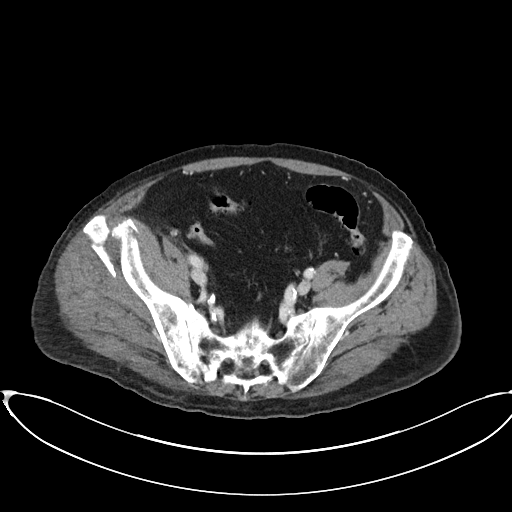
[im 40/107  soft-tissue]
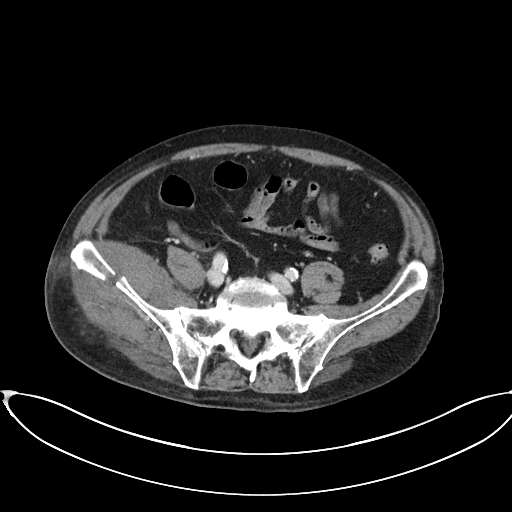
[im 47/107  soft-tissue]
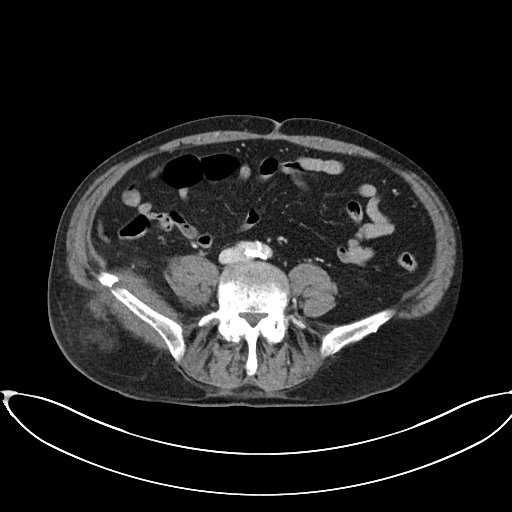
[im 60/107  soft-tissue]
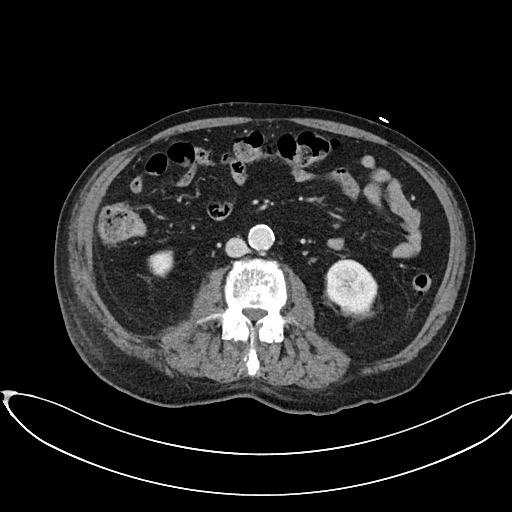
[im 67/107  soft-tissue]
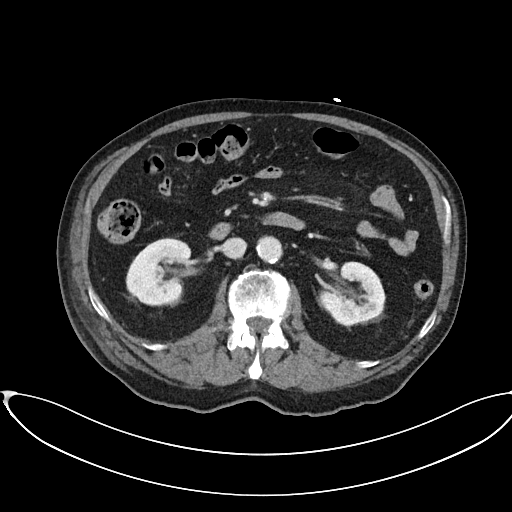
[im 73/107  soft-tissue]
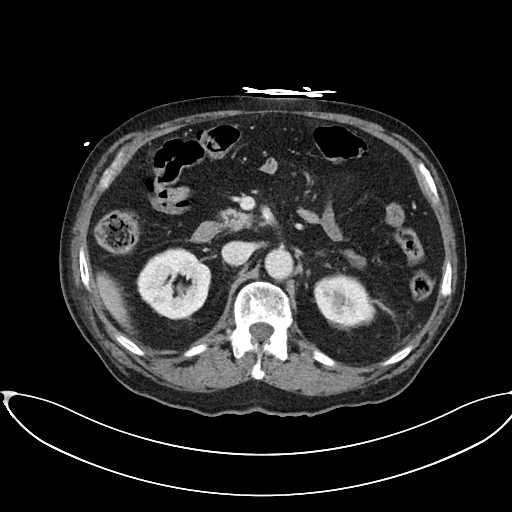
[im 73/107  bone]
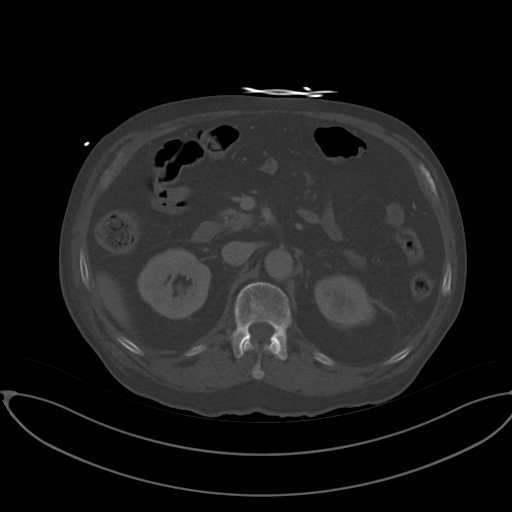
[im 80/107  soft-tissue]
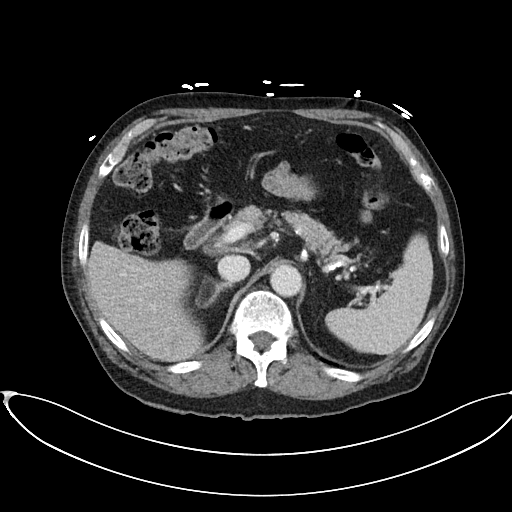
[im 93/107  soft-tissue]
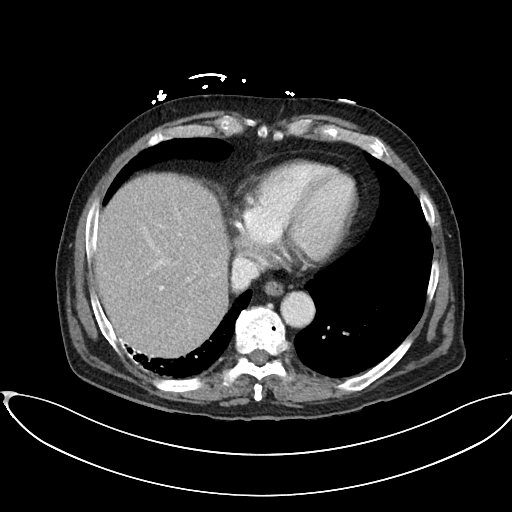
[im 100/107  soft-tissue]
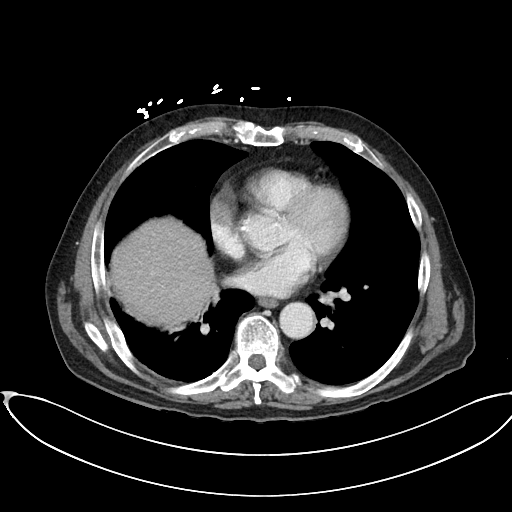

[Series 6: abdomen 3.0 mpr cor · coronal · 0.78mm/px · 3 of 86 slices shown]
[im 29/86  soft-tissue]
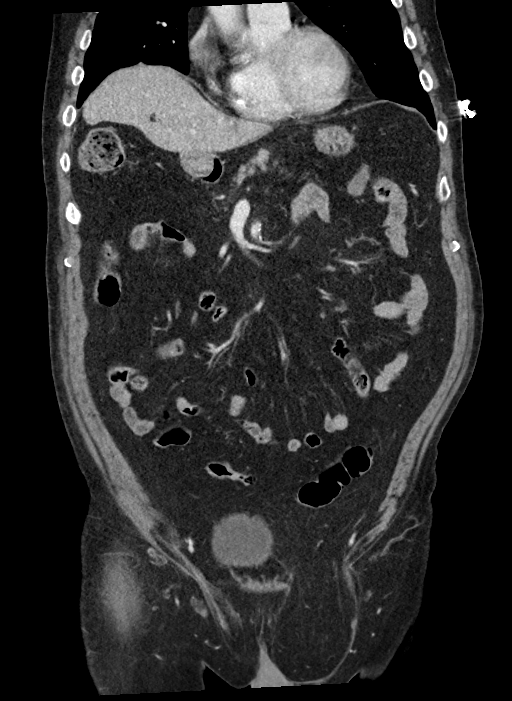
[im 38/86  soft-tissue]
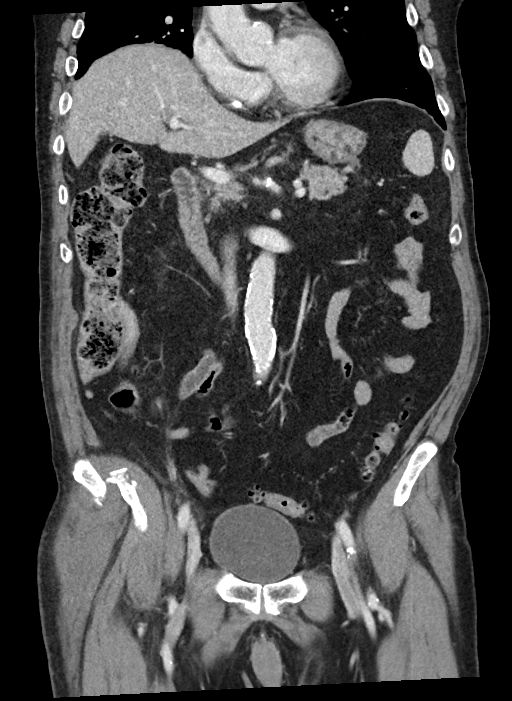
[im 48/86  soft-tissue]
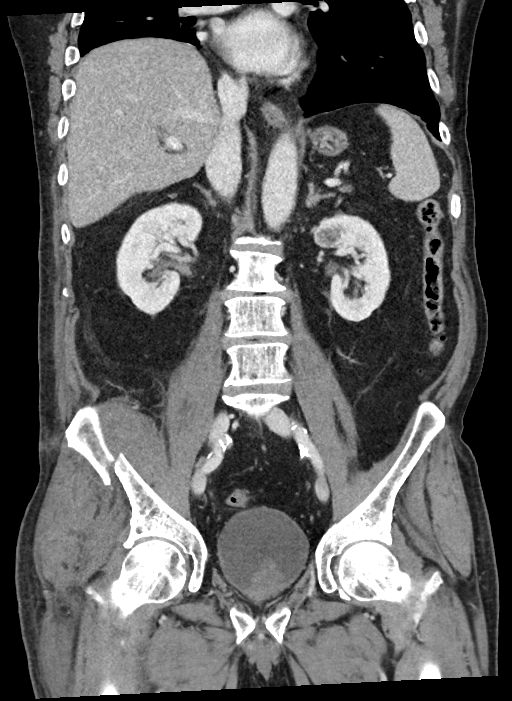

[15 of 46 positions shown; findings below may reference images not displayed]

FINDINGS: Lower chest: Right base atelectasis or scarring. No effusions. Heart
is normal size.

Hepatobiliary: Low-density lesion in the left hepatic lobe is again
noted measuring up to 2.2 cm, similar to prior study. No other focal
hepatic abnormality. Prior cholecystectomy.

Pancreas: No focal abnormality or ductal dilatation.

Spleen: No focal abnormality.  Normal size.

Adrenals/Urinary Tract: Nonobstructing left renal stones noted. No
hydronephrosis. Exophytic cyst off the midpole of the left kidney
measures 2.6 cm. New low-density lesion noted anteriorly within the
superior pole on image 38 of series 3 measuring 2.0 cm. This appears
to have areas of internal enhancement. This was not definitively
seen on prior study.

Stomach/Bowel: Left colonic diverticulosis. No active
diverticulitis. No evidence of bowel obstruction.

Vascular/Lymphatic: Diffuse aortic and iliac calcifications. No
aneurysm or adenopathy.

Reproductive: No visible focal abnormality.

Other: No free fluid or free air. Large left inguinal hernia
containing fat.

Musculoskeletal: Displaced fracture noted through the right iliac
bone as seen on prior pelvic plain films. Intramuscular hematoma
within the right gluteal muscles.
IMPRESSION: Displaced mildly comminuted fracture through the right iliac bone.
Intramuscular hematoma within the right gluteal muscles.

Left colonic diverticulosis.

New 2 cm low-density lesion within the superior pole of the left
kidney which was not present on prior study and appears to be solid
with areas of enhancement. This is concerning for possible renal
neoplasm. Recommend further evaluation with MRI.

Advanced atherosclerotic change within the aorta and iliac vessels.

Large left inguinal hernia containing fat.

## 2019-01-23 ENCOUNTER — Other Ambulatory Visit: Payer: Self-pay

## 2019-01-23 ENCOUNTER — Inpatient Hospital Stay (HOSPITAL_COMMUNITY)
Admission: EM | Admit: 2019-01-23 | Discharge: 2019-02-01 | DRG: 871 | Disposition: A | Discharge status: Home Health | Payer: Medicare Other | Attending: Internal Medicine | Admitting: Internal Medicine

## 2019-01-23 ENCOUNTER — Emergency Department (HOSPITAL_COMMUNITY): Payer: Medicare Other

## 2019-01-23 ENCOUNTER — Encounter (HOSPITAL_COMMUNITY): Payer: Self-pay | Admitting: Internal Medicine

## 2019-01-23 DIAGNOSIS — Z7982 Long term (current) use of aspirin: Secondary | ICD-10-CM

## 2019-01-23 DIAGNOSIS — F10231 Alcohol dependence with withdrawal delirium: Secondary | ICD-10-CM | POA: Diagnosis not present

## 2019-01-23 DIAGNOSIS — Z66 Do not resuscitate: Secondary | ICD-10-CM | POA: Diagnosis present

## 2019-01-23 DIAGNOSIS — N183 Chronic kidney disease, stage 3 unspecified: Secondary | ICD-10-CM | POA: Diagnosis present

## 2019-01-23 DIAGNOSIS — A419 Sepsis, unspecified organism: Principal | ICD-10-CM | POA: Diagnosis present

## 2019-01-23 DIAGNOSIS — J181 Lobar pneumonia, unspecified organism: Secondary | ICD-10-CM | POA: Diagnosis not present

## 2019-01-23 DIAGNOSIS — Z87442 Personal history of urinary calculi: Secondary | ICD-10-CM

## 2019-01-23 DIAGNOSIS — R0682 Tachypnea, not elsewhere classified: Secondary | ICD-10-CM | POA: Diagnosis not present

## 2019-01-23 DIAGNOSIS — Z515 Encounter for palliative care: Secondary | ICD-10-CM | POA: Diagnosis not present

## 2019-01-23 DIAGNOSIS — K852 Alcohol induced acute pancreatitis without necrosis or infection: Secondary | ICD-10-CM | POA: Diagnosis present

## 2019-01-23 DIAGNOSIS — I129 Hypertensive chronic kidney disease with stage 1 through stage 4 chronic kidney disease, or unspecified chronic kidney disease: Secondary | ICD-10-CM | POA: Diagnosis present

## 2019-01-23 DIAGNOSIS — Z23 Encounter for immunization: Secondary | ICD-10-CM

## 2019-01-23 DIAGNOSIS — Z9221 Personal history of antineoplastic chemotherapy: Secondary | ICD-10-CM

## 2019-01-23 DIAGNOSIS — N281 Cyst of kidney, acquired: Secondary | ICD-10-CM | POA: Diagnosis present

## 2019-01-23 DIAGNOSIS — D696 Thrombocytopenia, unspecified: Secondary | ICD-10-CM | POA: Diagnosis present

## 2019-01-23 DIAGNOSIS — N200 Calculus of kidney: Secondary | ICD-10-CM | POA: Diagnosis present

## 2019-01-23 DIAGNOSIS — X58XXXA Exposure to other specified factors, initial encounter: Secondary | ICD-10-CM | POA: Diagnosis not present

## 2019-01-23 DIAGNOSIS — R112 Nausea with vomiting, unspecified: Secondary | ICD-10-CM | POA: Diagnosis not present

## 2019-01-23 DIAGNOSIS — Z9079 Acquired absence of other genital organ(s): Secondary | ICD-10-CM

## 2019-01-23 DIAGNOSIS — Z9842 Cataract extraction status, left eye: Secondary | ICD-10-CM | POA: Diagnosis not present

## 2019-01-23 DIAGNOSIS — Z79899 Other long term (current) drug therapy: Secondary | ICD-10-CM

## 2019-01-23 DIAGNOSIS — Z20828 Contact with and (suspected) exposure to other viral communicable diseases: Secondary | ICD-10-CM | POA: Diagnosis not present

## 2019-01-23 DIAGNOSIS — Y92230 Patient room in hospital as the place of occurrence of the external cause: Secondary | ICD-10-CM | POA: Diagnosis not present

## 2019-01-23 DIAGNOSIS — K219 Gastro-esophageal reflux disease without esophagitis: Secondary | ICD-10-CM | POA: Diagnosis not present

## 2019-01-23 DIAGNOSIS — E860 Dehydration: Secondary | ICD-10-CM | POA: Diagnosis not present

## 2019-01-23 DIAGNOSIS — K859 Acute pancreatitis without necrosis or infection, unspecified: Secondary | ICD-10-CM | POA: Diagnosis present

## 2019-01-23 DIAGNOSIS — I10 Essential (primary) hypertension: Secondary | ICD-10-CM

## 2019-01-23 DIAGNOSIS — Z961 Presence of intraocular lens: Secondary | ICD-10-CM | POA: Diagnosis present

## 2019-01-23 DIAGNOSIS — Z87891 Personal history of nicotine dependence: Secondary | ICD-10-CM

## 2019-01-23 DIAGNOSIS — G9341 Metabolic encephalopathy: Secondary | ICD-10-CM | POA: Diagnosis present

## 2019-01-23 DIAGNOSIS — R109 Unspecified abdominal pain: Secondary | ICD-10-CM | POA: Diagnosis not present

## 2019-01-23 DIAGNOSIS — R6510 Systemic inflammatory response syndrome (SIRS) of non-infectious origin without acute organ dysfunction: Secondary | ICD-10-CM

## 2019-01-23 DIAGNOSIS — Z8551 Personal history of malignant neoplasm of bladder: Secondary | ICD-10-CM

## 2019-01-23 DIAGNOSIS — E872 Acidosis, unspecified: Secondary | ICD-10-CM | POA: Diagnosis present

## 2019-01-23 DIAGNOSIS — Z7189 Other specified counseling: Secondary | ICD-10-CM | POA: Diagnosis not present

## 2019-01-23 DIAGNOSIS — R1031 Right lower quadrant pain: Secondary | ICD-10-CM | POA: Diagnosis not present

## 2019-01-23 DIAGNOSIS — H9190 Unspecified hearing loss, unspecified ear: Secondary | ICD-10-CM | POA: Diagnosis not present

## 2019-01-23 DIAGNOSIS — D72829 Elevated white blood cell count, unspecified: Secondary | ICD-10-CM | POA: Diagnosis not present

## 2019-01-23 DIAGNOSIS — E785 Hyperlipidemia, unspecified: Secondary | ICD-10-CM | POA: Diagnosis present

## 2019-01-23 DIAGNOSIS — N400 Enlarged prostate without lower urinary tract symptoms: Secondary | ICD-10-CM | POA: Diagnosis present

## 2019-01-23 LAB — URINALYSIS, ROUTINE W REFLEX MICROSCOPIC
Bacteria, UA: NONE SEEN
Bilirubin Urine: NEGATIVE
Glucose, UA: NEGATIVE mg/dL
Ketones, ur: 5 mg/dL — AB
Leukocytes,Ua: NEGATIVE
Nitrite: NEGATIVE
Protein, ur: NEGATIVE mg/dL
Specific Gravity, Urine: >1.046 — ABNORMAL HIGH (ref 1.005–1.030)
pH: 5 (ref 5.0–8.0)

## 2019-01-23 LAB — POCT I-STAT EG7
Acid-base deficit: 4 mmol/L — ABNORMAL HIGH (ref 0.0–2.0)
Bicarbonate: 22.1 mmol/L (ref 20.0–28.0)
Calcium, Ion: 1.09 mmol/L — ABNORMAL LOW (ref 1.15–1.40)
HCT: 48 % (ref 39.0–52.0)
Hemoglobin: 16.3 g/dL (ref 13.0–17.0)
O2 Saturation: 78 %
Potassium: 4.3 mmol/L (ref 3.5–5.1)
Sodium: 135 mmol/L (ref 135–145)
TCO2: 23 mmol/L (ref 22–32)
pCO2, Ven: 41.4 mmHg — ABNORMAL LOW (ref 44.0–60.0)
pH, Ven: 7.334 (ref 7.250–7.430)
pO2, Ven: 46 mmHg — ABNORMAL HIGH (ref 32.0–45.0)

## 2019-01-23 LAB — LIPASE, BLOOD: Lipase: 894 U/L — ABNORMAL HIGH (ref 11–51)

## 2019-01-23 LAB — COMPREHENSIVE METABOLIC PANEL
ALT: 17 U/L (ref 0–44)
AST: 29 U/L (ref 15–41)
Albumin: 3.8 g/dL (ref 3.5–5.0)
Alkaline Phosphatase: 141 U/L — ABNORMAL HIGH (ref 38–126)
Anion gap: 16 — ABNORMAL HIGH (ref 5–15)
BUN: 17 mg/dL (ref 8–23)
CO2: 17 mmol/L — ABNORMAL LOW (ref 22–32)
Calcium: 9.1 mg/dL (ref 8.9–10.3)
Chloride: 101 mmol/L (ref 98–111)
Creatinine, Ser: 1.12 mg/dL (ref 0.61–1.24)
GFR calc Af Amer: >60 mL/min (ref >60)
GFR calc non Af Amer: 58 mL/min — ABNORMAL LOW (ref >60)
Glucose, Bld: 179 mg/dL — ABNORMAL HIGH (ref 70–99)
Potassium: 4.3 mmol/L (ref 3.5–5.1)
Sodium: 134 mmol/L — ABNORMAL LOW (ref 135–145)
Total Bilirubin: 1.1 mg/dL (ref 0.3–1.2)
Total Protein: 6.4 g/dL — ABNORMAL LOW (ref 6.5–8.1)

## 2019-01-23 LAB — CBC
HCT: 51 % (ref 39.0–52.0)
Hemoglobin: 16 g/dL (ref 13.0–17.0)
MCH: 30.5 pg (ref 26.0–34.0)
MCHC: 31.4 g/dL (ref 30.0–36.0)
MCV: 97.1 fL (ref 80.0–100.0)
Platelets: 187 10*3/uL (ref 150–400)
RBC: 5.25 MIL/uL (ref 4.22–5.81)
RDW: 13.5 % (ref 11.5–15.5)
WBC: 16.3 10*3/uL — ABNORMAL HIGH (ref 4.0–10.5)
nRBC: 0 % (ref 0.0–0.2)

## 2019-01-23 LAB — LACTIC ACID, PLASMA
Lactic Acid, Venous: 5.3 mmol/L (ref 0.5–1.9)
Lactic Acid, Venous: 4.9 mmol/L (ref 0.5–1.9)

## 2019-01-23 LAB — INFLUENZA PANEL BY PCR (TYPE A & B)
Influenza A By PCR: NEGATIVE
Influenza B By PCR: NEGATIVE

## 2019-01-23 LAB — TRIGLYCERIDES: Triglycerides: 78 mg/dL (ref <150)

## 2019-01-23 LAB — PROCALCITONIN: Procalcitonin: <0.1 ng/mL

## 2019-01-23 MED ORDER — SODIUM CHLORIDE 0.9% FLUSH: 3.0000 mL | Freq: Once | INTRAVENOUS | Status: DC

## 2019-01-23 MED ORDER — IOHEXOL 300 MG/ML  SOLN
100.0000 mL | Freq: Once | INTRAMUSCULAR | Status: AC | PRN
  Administered 2019-01-23: 100 mL via INTRAVENOUS

## 2019-01-23 MED ORDER — ONDANSETRON HCL 4 MG PO TABS: 4.0000 mg | ORAL_TABLET | Freq: Four times a day (QID) | ORAL | Status: DC | PRN

## 2019-01-23 MED ORDER — FAMOTIDINE 20 MG IN NS 100 ML IVPB
20.0000 mg | Freq: Two times a day (BID) | INTRAVENOUS | Status: DC
  Administered 2019-01-23 – 2019-01-27 (×9): 20 mg via INTRAVENOUS
  Filled 2019-01-23 (×13): qty 100

## 2019-01-23 MED ORDER — DM-GUAIFENESIN ER 30-600 MG PO TB12
1.0000 | ORAL_TABLET | Freq: Two times a day (BID) | ORAL | Status: DC | PRN
  Administered 2019-01-26: 1 via ORAL
  Filled 2019-01-23 (×3): qty 1

## 2019-01-23 MED ORDER — VITAMIN D 25 MCG (1000 UNIT) PO TABS
1000.0000 [IU] | ORAL_TABLET | ORAL | Status: DC
  Filled 2019-01-23: qty 1

## 2019-01-23 MED ORDER — SODIUM CHLORIDE 0.9 % IV SOLN
1.0000 g | INTRAVENOUS | Status: DC
  Administered 2019-01-23 – 2019-01-27 (×4): 1 g via INTRAVENOUS
  Filled 2019-01-23 (×5): qty 10

## 2019-01-23 MED ORDER — MORPHINE SULFATE (PF) 2 MG/ML IV SOLN
2.0000 mg | Freq: Once | INTRAVENOUS | Status: AC
  Administered 2019-01-23: 2 mg via INTRAVENOUS
  Filled 2019-01-23: qty 1

## 2019-01-23 MED ORDER — SODIUM CHLORIDE 0.9 % IV BOLUS
500.0000 mL | Freq: Once | INTRAVENOUS | Status: AC
  Administered 2019-01-23: 500 mL via INTRAVENOUS

## 2019-01-23 MED ORDER — ONDANSETRON HCL 4 MG/2ML IJ SOLN: 4.0000 mg | Freq: Four times a day (QID) | INTRAMUSCULAR | Status: DC | PRN

## 2019-01-23 MED ORDER — SODIUM CHLORIDE 0.9 % IV SOLN
500.0000 mg | INTRAVENOUS | Status: DC
  Administered 2019-01-23 – 2019-01-27 (×4): 500 mg via INTRAVENOUS
  Filled 2019-01-23 (×6): qty 500

## 2019-01-23 MED ORDER — ENOXAPARIN SODIUM 40 MG/0.4ML ~~LOC~~ SOLN
40.0000 mg | SUBCUTANEOUS | Status: DC
  Administered 2019-01-24 – 2019-02-01 (×8): 40 mg via SUBCUTANEOUS
  Filled 2019-01-23 (×8): qty 0.4

## 2019-01-23 MED ORDER — OXYCODONE-ACETAMINOPHEN 5-325 MG PO TABS: 1.0000 | ORAL_TABLET | ORAL | Status: DC | PRN

## 2019-01-23 MED ORDER — ACETAMINOPHEN 325 MG PO TABS
650.0000 mg | ORAL_TABLET | Freq: Four times a day (QID) | ORAL | Status: DC | PRN
  Administered 2019-01-23 – 2019-01-29 (×4): 650 mg via ORAL
  Filled 2019-01-23 (×4): qty 2

## 2019-01-23 MED ORDER — MORPHINE SULFATE (PF) 2 MG/ML IV SOLN: 0.5000 mg | INTRAVENOUS | Status: DC | PRN

## 2019-01-23 MED ORDER — HYDRALAZINE HCL 20 MG/ML IJ SOLN
5.0000 mg | INTRAMUSCULAR | Status: DC | PRN
  Administered 2019-01-26: 5 mg via INTRAVENOUS
  Filled 2019-01-23 (×2): qty 1

## 2019-01-23 MED ORDER — LACTATED RINGERS IV BOLUS
1000.0000 mL | Freq: Once | INTRAVENOUS | Status: AC
  Administered 2019-01-23: 1000 mL via INTRAVENOUS

## 2019-01-23 MED ORDER — ACETAMINOPHEN 650 MG RE SUPP: 650.0000 mg | Freq: Four times a day (QID) | RECTAL | Status: DC | PRN

## 2019-01-23 MED ORDER — IPRATROPIUM BROMIDE HFA 17 MCG/ACT IN AERS
2.0000 | INHALATION_SPRAY | RESPIRATORY_TRACT | Status: DC
  Administered 2019-01-24 – 2019-01-29 (×29): 2 via RESPIRATORY_TRACT
  Filled 2019-01-23 (×2): qty 12.9

## 2019-01-23 MED ORDER — SODIUM CHLORIDE 0.9 % IV BOLUS
1000.0000 mL | Freq: Once | INTRAVENOUS | Status: AC
  Administered 2019-01-23: 1000 mL via INTRAVENOUS

## 2019-01-23 MED ORDER — ASPIRIN EC 81 MG PO TBEC: 81.0000 mg | DELAYED_RELEASE_TABLET | ORAL | Status: DC | PRN

## 2019-01-23 MED ORDER — ALBUTEROL SULFATE HFA 108 (90 BASE) MCG/ACT IN AERS
2.0000 | INHALATION_SPRAY | RESPIRATORY_TRACT | Status: DC | PRN
  Administered 2019-01-26 – 2019-01-27 (×4): 2 via RESPIRATORY_TRACT
  Filled 2019-01-23 (×2): qty 6.7

## 2019-01-23 MED ORDER — SODIUM CHLORIDE 0.9 % IV SOLN
INTRAVENOUS | Status: DC
  Administered 2019-01-23 – 2019-01-24 (×2): via INTRAVENOUS

## 2019-01-23 NOTE — ED Notes (Signed)
Dr. Blaine Hamper notified of lactic acid of 5.3.

## 2019-01-23 NOTE — H&P (Addendum)
History and Physical    Ryan Tyler DOB: 1928-04-01 DOA: 01/23/2019  Referring MD/NP/PA:   PCP: Josetta Huddle, MD   Patient coming from:  The patient is coming from home.  At baseline, pt is independent for most of ADL.        Chief Complaint: Nausea, vomiting, abdominal pain  HPI: Ryan Tyler is a 85 y.o. male with medical history significant of hypertension, hyperlipidemia, GERD, b bladder cancer (s/p of TURBT-GYRS), BPH (s/p of TURP), HOH, CKD-III, who presents with nausea, vomiting, abdominal pain.  Pt has very poor hearing, and is very poor historian, it is very hard to get accurate history.  I tried to call his son, but nobody picked up the phone. Pre EDP, pt's was intoxicated at arrival to the ED earlier. Pt states that he has been having nausea vomiting and abdominal pain for several days.  He does not see blood in the vomitus.  Denies diarrhea.  Patient does not have fever or chills.  He denies any cough, shortness of breath, chest pain.  No symptoms of UTI or unilateral weakness. No long distant traveling or sick contact recently.  ED Course: pt was found to have elevated lipase 894, normal liver function, WBC 16.3, lactic acid 5.3, negative urinalysis, stable renal function, metabolic acidosis, temperature normal, no tachycardia, has tachypnea, oxygen saturation 94% on room air.  CT abdomen/pelvis that showed acute pancreatitis without pseudocyst or abscess formation.  CT-abdomen/pelvis: 1. Acute pancreatitis.  No evidence of pseudocyst or abscess. 2. Right lower lobe atelectasis and or pneumonia. 3. Fatty liver.  Chronic hemangioma left lobe. 4. Nonobstructing 1 cm left renal stone. Hyperdense cyst lower pole left kidney. Indistinct 2 cm region in the upper pole of the left kidney, similar to the study of 11 months ago, indeterminate. This could possibly represent a renal mass lesion.  5. Aortic atherosclerosis. 6. Left inguinal hernia containing only  fat. 7. 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 8. Enlarged prostate.  CXR showed: abnormal density seen at chest CT at the right lung base is redemonstrated and could be due to volume loss or mild right base pneumonia.  Review of Systems:   General: no fevers, chills, no body weight gain, has poor appetite, has fatigue HEENT: no blurry vision, hearing changes or sore throat Respiratory: no dyspnea, coughing, wheezing CV: no chest pain, no palpitations GI: has nausea, vomiting, abdominal pain, no diarrhea, constipation GU: no dysuria, burning on urination, increased urinary frequency, hematuria  Ext: no leg edema Neuro: no unilateral weakness, numbness, or tingling, no vision change. HOH. Skin: no rash, no skin tear. MSK: No muscle spasm, no deformity, no limitation of range of movement in spin Heme: No easy bruising.  Travel history: No recent long distant travel.  Allergy: No Known Allergies  Past Medical History:  Diagnosis Date  . Asymptomatic cholelithiasis   . Bladder tumor   . BPH (benign prostatic hypertrophy)   . Cancer ([place])    bladder  . GERD (gastroesophageal reflux disease)   . Hearing loss   . HOH (hard of hearing)    no hearing aids  . Hyperlipidemia   . Hypertension   . Kidney stones   . Mild left inguinal hernia   . Nocturia   . Urgency of urination     Past Surgical History:  Procedure Laterality Date  . CATARACT EXTRACTION W/ INTRAOCULAR LENS IMPLANT Left nov  2014  . CHOLECYSTECTOMY N/A 08/16/2015   Procedure: LAPAROSCOPIC CHOLECYSTECTOMY;  Surgeon: Rolm Bookbinder, MD;  Location: [place];  Service: General;  Laterality: N/A;  . TONSILLECTOMY    . TRANSURETHRAL RESECTION OF BLADDER TUMOR WITH GYRUS (TURBT-GYRUS) N/A 04/05/2014   Procedure: TRANSURETHRAL RESECTION OF BLADDER TUMOR WITH GYRUS  AND INTRAVESICO CHEMO;  Surgeon: Claybon Jabs, MD;  Location: [facility];  Service: Urology;  Laterality: N/A;  . TRANSURETHRAL  RESECTION OF PROSTATE  1995    Social History:  reports that he has never smoked. He quit smokeless tobacco use about 9 years ago.  His smokeless tobacco use included chew. He reports current alcohol use. He reports that he does not use drugs.  Family History:  Family History  Problem Relation Age of Onset  . Cancer Mother   . Leukemia Other      Prior to Admission medications   Medication Sig Start Date End Date Taking? Authorizing Provider  acetaminophen (TYLENOL) 325 MG tablet Take 2 tablets (650 mg total) by mouth every 6 (six) hours as needed for mild pain (or Fever >/= 101). 03/11/18   Geradine Girt, DO  aspirin EC 81 MG tablet Take 81 mg by mouth daily.    [provider]  Cholecalciferol (VITAMIN D3 PO) Take 1 tablet by mouth daily.    [provider]  lisinopril-hydrochlorothiazide (PRINZIDE,ZESTORETIC) 20-25 MG per tablet Take 1 tablet by mouth daily.     [provider]  naproxen sodium (ALEVE) 220 MG tablet Take 220 mg by mouth daily as needed (pain).    [provider]    Physical Exam: Vitals:   01/23/19 2200 01/23/19 2215 01/23/19 2300 01/23/19 2330  BP: (!) 152/70  (!) 157/78 138/80  Pulse: 86  89 98  Resp:      Temp:  100.2 F (37.9 C)    TempSrc:  Rectal    SpO2: 94%  91% 93%  Weight:      Height:       General: Not in acute distress HEENT:       Eyes: PERRL, EOMI, no scleral icterus.        ENT: No discharge from the ears and nose, no pharynx injection, no tonsillar enlargement. HOH.       Neck: No JVD, no bruit, no mass felt. Heme: No neck lymph node enlargement. Cardiac: S1/S2, RRR, No murmurs, No gallops or rubs. Respiratory: No rales, wheezing, rhonchi or rubs. GI: Soft, nondistended, diffused tenderness, no rebound pain, no organomegaly, BS present. GU: No hematuria Ext: No pitting leg edema bilaterally. 2+DP/PT pulse bilaterally. Musculoskeletal: No joint deformities, No joint redness or warmth, no limitation  of ROM in spin. Skin: No rashes.  Neuro: Alert, oriented X3, cranial nerves II-XII grossly intact, moves all extremities normally.  Psych: Patient is not psychotic, no suicidal or hemocidal ideation.  Labs on Admission: I have personally reviewed following labs and imaging studies  CBC: Recent Labs  Lab 01/23/19 1717 01/23/19 2015  WBC 16.3*  --   HGB 16.0 16.3  HCT 51.0 48.0  MCV 97.1  --   PLT 187  --    Basic Metabolic Panel: Recent Labs  Lab 01/23/19 1717 01/23/19 2015  NA 134* 135  K 4.3 4.3  CL 101  --   CO2 17*  --   GLUCOSE 179*  --   BUN 17  --   CREATININE 1.12  --   CALCIUM 9.1  --    GFR: Estimated Creatinine Clearance: 49.1 mL/min (by C-G formula based on SCr  of 1.12 mg/dL). Liver Function Tests: Recent Labs  Lab 01/23/19 1717  AST 29  ALT 17  ALKPHOS 141*  BILITOT 1.1  PROT 6.4*  ALBUMIN 3.8   Recent Labs  Lab 01/23/19 1717  LIPASE 894*   No results for input(s): AMMONIA in the last 168 hours. Coagulation Profile: No results for input(s): INR, PROTIME in the last 168 hours. Cardiac Enzymes: No results for input(s): CKTOTAL, CKMB, CKMBINDEX, TROPONINI in the last 168 hours. BNP (last 3 results) No results for input(s): PROBNP in the last 8760 hours. HbA1C: No results for input(s): HGBA1C in the last 72 hours. CBG: No results for input(s): GLUCAP in the last 168 hours. Lipid Profile: Recent Labs    01/23/19 2108  TRIG 78   Thyroid Function Tests: No results for input(s): TSH, T4TOTAL, FREET4, T3FREE, THYROIDAB in the last 72 hours. Anemia Panel: No results for input(s): VITAMINB12, FOLATE, FERRITIN, TIBC, IRON, RETICCTPCT in the last 72 hours. Urine analysis:    Component Value Date/Time   COLORURINE STRAW (A) 01/23/2019 2012   APPEARANCEUR CLEAR 01/23/2019 2012   LABSPEC >1.046 (H) 01/23/2019 2012   PHURINE 5.0 01/23/2019 2012   GLUCOSEU NEGATIVE 01/23/2019 2012   HGBUR SMALL (A) 01/23/2019 2012   BILIRUBINUR NEGATIVE  01/23/2019 2012   KETONESUR 5 (A) 01/23/2019 2012   PROTEINUR NEGATIVE 01/23/2019 2012   UROBILINOGEN 1.0 10/20/2012 0016   NITRITE NEGATIVE 01/23/2019 2012   LEUKOCYTESUR NEGATIVE 01/23/2019 2012   Sepsis Labs: @LABRCNTIP (procalcitonin:4,lacticidven:4) )No results found for this or any previous visit (from the past 240 hour(s)).   Radiological Exams on Admission: Ct Abdomen Pelvis W Contrast  Result Date: 01/23/2019 CLINICAL DATA:  Abdominal pain over the last several days. Intoxicated. EXAM: CT ABDOMEN AND PELVIS WITH CONTRAST TECHNIQUE: Multidetector CT imaging of the abdomen and pelvis was performed using the standard protocol following bolus administration of intravenous contrast. CONTRAST:  151mL OMNIPAQUE IOHEXOL 300 MG/ML  SOLN COMPARISON:  03/09/2018.  Multiple prior CTs.  Liver MRI 05/29/2011. FINDINGS: Lower chest: Atelectasis or patchy infiltrate in the right lower lobe. Hepatobiliary: Fatty liver. 2.7 cm low-density area in the upper left lobe, unchanged for many years and felt consistent with a hemangioma on previous studies. Previous cholecystectomy. Pancreas: Mild retroperitoneal edema surrounding the pancreas, probably indicating early pancreatitis. Spleen: Normal Adrenals/Urinary Tract: Adrenal glands are normal. Kidneys show a 2.5 cm cyst at the lower pole on the left and an exophytic left lower pole lesion measuring a cm which has been present for a number of years but has become more dense, most consistent with a hyperdense cyst. Nonobstructing 1 cm stone in the midportion of the left kidney. 2 cm region in the upper pole of left kidney as seen previously, which is indeterminate for a complicated cyst versus a renal mass. This has not visibly enlarged since the previous study. MRI would be suggested for characterization. The bladder appears normal. Stomach/Bowel: Possible duodenitis. Edema in that region may simply be secondary to the adjacent pancreatitis. No other bowel finding of  note. Diverticulosis without evidence of diverticulitis. Vascular/Lymphatic: Aortic atherosclerosis. Maximal diameter of the infrarenal aorta 2.5 cm. IVC negative. No adenopathy. Reproductive: Enlarged prostate. Other: Large left inguinal hernia containing only fat. Small amount of ascites secondary to the pancreatitis. Musculoskeletal: Chronic degenerative changes affect the spine. 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 IMPRESSION: Acute pancreatitis.  No evidence of pseudocyst or abscess. Right lower lobe atelectasis and or pneumonia. Fatty liver.  Chronic hemangioma left lobe. Nonobstructing 1 cm  left renal stone. Hyperdense cyst lower pole left kidney. Indistinct 2 cm region in the upper pole of the left kidney, similar to the study of 11 months ago, indeterminate. This could possibly represent a renal mass lesion. Renal MRI would be suggested electively. Aortic atherosclerosis. Left inguinal hernia containing only fat. 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 Enlarged prostate. Electronically Signed   By: Nelson Chimes M.D.   On: 01/23/2019 19:37   Dg Chest Portable 1 View  Result Date: 01/23/2019 CLINICAL DATA:  Tachypnea.  Abnormal right lower lobe at CT. EXAM: PORTABLE CHEST 1 VIEW COMPARISON:  CT earlier same day.  Radiography 02/14/2019 FINDINGS: Heart size is normal. Chronic aortic atherosclerosis. The upper lungs are clear. Mild density at the right lung base could be atelectasis or mild pneumonia. No visible effusion. Old healed rib fractures on the right. IMPRESSION: Abnormal density seen at chest CT at the right lung base is redemonstrated and could be due to volume loss or mild right base pneumonia. Electronically Signed   By: Nelson Chimes M.D.   On: 01/23/2019 20:23     EKG: Not done in ED, will get one.   Assessment/Plan Principal Problem:   Acute pancreatitis Active Problems:   Hypertension   Metabolic acidemia   CKD (chronic kidney disease), stage III (HCC)   Leukocytosis    SIRS (systemic inflammatory response syndrome) (HCC)   Lobar pneumonia (HCC)   Sepsis (HCC)   Acute pancreatitis: lipase 894.  Liver function normal.  Total bilirubin normal.  Patient is supposed status of cholecystitis.  -will place in med-surg bed for observation-->due to lactic acid 5.3, floor does not accept pt, will change to progressive bed -NPO for pancreatitis -IVF: 1L ringer solution was given in ED, will give 1.5 L of NS bolus, then 100 cc/h -prn IV morphine and percet for pain control -prn IV zofran for nausea -IV Pepcid  --US-RUQ -check triglyceridemia level  Hypertension: -hold Prinzide since patient is at risk of developing hypotension due to Sirs -IV hydralazine as needed  Metabolic acidemia due to lactic acidosis: -IVF as above -trend lactic acid level  CKD (chronic kidney disease), stage III ([place]): stable Cre 1.12 and BUN 17 -f/u by BMP  Leukocytosis and SIRS/Sepsis: no fever. No source of infection identified.  UA negative.  No respiratory symptoms. Likely due to reactive, but he meets SIRS/Sepsis criteria with eukocytosis and tachypnea.  Lactic acid is elevated 5.3.  Currently hemodynamically stable -will follow up blood culture -will get Procalcitonin and trend lactic acid level -IVF:  1L ringer solution was given in ED, will give 1.5 L of NS bolus, then 100 cc/h -follow up by CBC  Abnormal findings of lung by CXR and CT-abd/pelvis: CT-abd/pelvis showed some right lower lobe atelectasis and or infiltration. CXR also showed abnormal density at the right lung base.  Etiology is not clear patient does not have symptoms for pneumonia.  No cough, shortness of breath or chest pain.  Clinically does not seem to have pneumonia. No long distant traveling or sick contact recently. I used Capr and gown in pt's room. After evaluated the patient, I feel that patient has low risk for CVID19, but given abnormal lung findings in chest x-ray and CT scan, will put pt on droplet and  contact precaution -will put pt on droplet and contact precaution -will hold off Abx now. If pt develops fever or respiratory symptoms, will start antibiotics for pneumonia   Addendum: Patient initially did not have fever, but  developed fever later on 100.2.  He also developed wheezing and mild SOB on lung auscultation.  Given fever, leukocytosis and chest x-ray findings of right base opacity, will treat patient as lobar pneumonia now. This also increased the risk of COVID 19.  Lobar PNA:  - IV Rocephin and azithromycin, - Mucinex for cough  - Atrovent inhaler and prn Albuterol inhaler SOB - Urine legionella and S. pneumococcal antigen - Follow up blood culture x2, sputum culture, plus Flu pcr  COVID subjective risk assessment:  Physician PPE: I used Capr, gown, glove Patient PPE: mask Fever: yes Cough: no SOB: yes URI symptoms: yes, wheezing GI symptoms: yes Travel: none Sick contacts: none CBC: leukopenia, lymphopenia-->no BMP: increased BUN/Cr=17/1/12 LFTs: increased AST/ALT/Tbili-->no CRP, LDH: pending Procalcitonin: pending  CXR: hazy bilateral peripheral opacities-->no CT chest: not done, but CT-abd/pelvis showed opacity of right lung base COVID subjective risk assessment: low COVID Testing: indicated per current ID/[place] guidelines-->yes Precaution: Droplet and contact   Renal stone and kidney cyst: CT scan incidentaly showed nonobstructing 1 cm left renal stone. Hyperdense cyst lower pole left kidney. Indistinct 2 cm region in the upper pole of the left kidney, similar to the study of 11 months ago, indeterminate. This could possibly represent a renal mass lesion. -need to f/u with PCP    DVT ppx: SQ Lovenox Code Status: Full code Family Communication: None at bed side.   Disposition Plan:  Anticipate discharge back to previous home environment Consults called:  none Admission status:   medical floor/obs   :  Date of Service 01/23/2019    [facility]   If 7PM-7AM, please contact night-coverage [email or web address] Password [facility] 01/23/2019, 11:43 PM

## 2019-01-23 NOTE — ED Triage Notes (Signed)
Pt arrives to ED with c/o of abd pain for the past few days. Pt is bad historian and son is intoxicated on arrival.

## 2019-01-23 NOTE — ED Notes (Signed)
ED TO INPATIENT HANDOFF REPORT  ED Nurse Name and Phone #: Sharrie Rothman [phone]  S Name/Age/Gender Ryan Tyler 85 y.o. male Room/Bed: 018C/018C  Code Status   Code Status: Full Code  Home/SNF/Other Home Patient oriented to: self, place, time and situation Is this baseline? Yes   Triage Complete: Triage complete  Chief Complaint Abd pain  Triage Note Pt arrives to ED with c/o of abd pain for the past few days. Pt is bad historian and son is intoxicated on arrival.    Allergies No Known Allergies  Level of Care/Admitting Diagnosis ED Disposition    ED Disposition Condition [place]: [place] [100100]  Level of Care: Med-Surg [16]  I expect the patient will be discharged within 24 hours: No (not a candidate for 5C-Observation unit)  Diagnosis: Acute pancreatitis [577.0.ICD-9-CM]  Admitting Physician: Ivor Costa [[id number]]  Attending Physician: Ivor Costa [[id number]]  PT Class (Do Not Modify): Observation [104]  PT Acc Code (Do Not Modify): Observation [10022]       B Medical/Surgery History Past Medical History:  Diagnosis Date  . Asymptomatic cholelithiasis   . Bladder tumor   . BPH (benign prostatic hypertrophy)   . Cancer ([place])    bladder  . GERD (gastroesophageal reflux disease)   . Hearing loss   . HOH (hard of hearing)    no hearing aids  . Hyperlipidemia   . Hypertension   . Kidney stones   . Mild left inguinal hernia   . Nocturia   . Urgency of urination    Past Surgical History:  Procedure Laterality Date  . CATARACT EXTRACTION W/ INTRAOCULAR LENS IMPLANT Left nov  2014  . CHOLECYSTECTOMY N/A 08/16/2015   Procedure: LAPAROSCOPIC CHOLECYSTECTOMY;  Surgeon: Rolm Bookbinder, MD;  Location: [place];  Service: General;  Laterality: N/A;  . TONSILLECTOMY    . TRANSURETHRAL RESECTION OF BLADDER TUMOR WITH GYRUS (TURBT-GYRUS) N/A 04/05/2014   Procedure: TRANSURETHRAL RESECTION OF BLADDER TUMOR WITH GYRUS  AND INTRAVESICO  CHEMO;  Surgeon: Claybon Jabs, MD;  Location: [facility];  Service: Urology;  Laterality: N/A;  . TRANSURETHRAL RESECTION OF PROSTATE  1995     A IV Location/Drains/Wounds Patient Lines/Drains/Airways Status   Active Line/Drains/Airways    Name:   Placement date:   Placement time:   Site:   Days:   Peripheral IV 01/23/19 Left Antecubital   01/23/19    1818    Antecubital   less than 1   Peripheral IV 01/23/19 Left;Posterior Forearm   01/23/19    2131    Forearm   less than 1   External Urinary Catheter   -    -    -      Pressure Injury 04/15/17 Stage I -  Intact skin with non-blanchable redness of a localized area usually over a bony prominence. redness to bilateral gluteal folds    04/15/17    2016     648          Intake/Output Last 24 hours  Intake/Output Summary (Last 24 hours) at 01/23/2019 2146 Last data filed at 01/23/2019 2004 Gross per 24 hour  Intake 1000 ml  Output -  Net 1000 ml    Labs/Imaging Results for orders placed or performed during the hospital encounter of 01/23/19 (from the past 48 hour(s))  Lipase, blood     Status: Abnormal   Collection Time: 01/23/19  5:17 PM  Result Value Ref Range  Lipase 894 (H) 11 - 51 U/L    Comment: RESULTS CONFIRMED BY MANUAL DILUTION Performed at [facility], [place] [address], [place], [place] [address]   Comprehensive metabolic panel     Status: Abnormal   Collection Time: 01/23/19  5:17 PM  Result Value Ref Range   Sodium 134 (L) 135 - 145 mmol/L   Potassium 4.3 3.5 - 5.1 mmol/L   Chloride 101 98 - 111 mmol/L   CO2 17 (L) 22 - 32 mmol/L   Glucose, Bld 179 (H) 70 - 99 mg/dL   BUN 17 8 - 23 mg/dL   Creatinine, Ser 1.12 0.61 - 1.24 mg/dL   Calcium 9.1 8.9 - 10.3 mg/dL   Total Protein 6.4 (L) 6.5 - 8.1 g/dL   Albumin 3.8 3.5 - 5.0 g/dL   AST 29 15 - 41 U/L   ALT 17 0 - 44 U/L   Alkaline Phosphatase 141 (H) 38 - 126 U/L   Total Bilirubin 1.1 0.3 - 1.2 mg/dL   GFR calc non Af Amer 58 (L) >60  mL/min   GFR calc Af Amer >60 >60 mL/min   Anion gap 16 (H) 5 - 15    Comment: Performed at [facility], [place] [address], [place], [place] [address]  CBC     Status: Abnormal   Collection Time: 01/23/19  5:17 PM  Result Value Ref Range   WBC 16.3 (H) 4.0 - 10.5 K/uL   RBC 5.25 4.22 - 5.81 MIL/uL   Hemoglobin 16.0 13.0 - 17.0 g/dL   HCT 51.0 39.0 - 52.0 %   MCV 97.1 80.0 - 100.0 fL   MCH 30.5 26.0 - 34.0 pg   MCHC 31.4 30.0 - 36.0 g/dL   RDW 13.5 11.5 - 15.5 %   Platelets 187 150 - 400 K/uL   nRBC 0.0 0.0 - 0.2 %    Comment: Performed at [facility], [place] [address] [place], [place] [address]  Lactic acid, plasma     Status: Abnormal   Collection Time: 01/23/19  8:07 PM  Result Value Ref Range   Lactic Acid, Venous 5.3 (HH) 0.5 - 1.9 mmol/L    Comment: CRITICAL RESULT CALLED TO, READ BACK BY AND VERIFIED WITH: C Abigail Marsiglia RN AT 2051 ON [id number] BY Marcos Eke Performed at [facility], [place] [address] [place], [place] [address]   Urinalysis, Routine w reflex microscopic     Status: Abnormal   Collection Time: 01/23/19  8:12 PM  Result Value Ref Range   Color, Urine STRAW (A) YELLOW   APPearance CLEAR CLEAR   Specific Gravity, Urine >1.046 (H) 1.005 - 1.030   pH 5.0 5.0 - 8.0   Glucose, UA NEGATIVE NEGATIVE mg/dL   Hgb urine dipstick SMALL (A) NEGATIVE   Bilirubin Urine NEGATIVE NEGATIVE   Ketones, ur 5 (A) NEGATIVE mg/dL   Protein, ur NEGATIVE NEGATIVE mg/dL   Nitrite NEGATIVE NEGATIVE   Leukocytes,Ua NEGATIVE NEGATIVE   RBC / HPF 6-10 0 - 5 RBC/hpf   WBC, UA 0-5 0 - 5 WBC/hpf   Bacteria, UA NONE SEEN NONE SEEN   Squamous Epithelial / LPF 0-5 0 - 5   Mucus PRESENT     Comment: Performed at [facility], [place] [address] [place], [place] [address]  POCT I-Stat EG7     Status: Abnormal   Collection Time: 01/23/19  8:15 PM  Result Value Ref Range   pH, Ven 7.334  7.250 - 7.430   pCO2, Ven 41.4 (L) 44.0 - 60.0 mmHg   pO2, Ven 46.0 (H) 32.0 - 45.0 mmHg    Bicarbonate 22.1 20.0 - 28.0 mmol/L   TCO2 23 22 - 32 mmol/L   O2 Saturation 78.0 %   Acid-base deficit 4.0 (H) 0.0 - 2.0 mmol/L   Sodium 135 135 - 145 mmol/L   Potassium 4.3 3.5 - 5.1 mmol/L   Calcium, Ion 1.09 (L) 1.15 - 1.40 mmol/L   HCT 48.0 39.0 - 52.0 %   Hemoglobin 16.3 13.0 - 17.0 g/dL   Patient temperature HIDE    Sample type VENOUS   Triglycerides     Status: None   Collection Time: 01/23/19  9:08 PM  Result Value Ref Range   Triglycerides 78 <150 mg/dL    Comment: Performed at [facility], [place] [address], [place], [place] [address]   Ct Abdomen Pelvis W Contrast  Result Date: 01/23/2019 CLINICAL DATA:  Abdominal pain over the last several days. Intoxicated. EXAM: CT ABDOMEN AND PELVIS WITH CONTRAST TECHNIQUE: Multidetector CT imaging of the abdomen and pelvis was performed using the standard protocol following bolus administration of intravenous contrast. CONTRAST:  184mL OMNIPAQUE IOHEXOL 300 MG/ML  SOLN COMPARISON:  03/09/2018.  Multiple prior CTs.  Liver MRI 05/29/2011. FINDINGS: Lower chest: Atelectasis or patchy infiltrate in the right lower lobe. Hepatobiliary: Fatty liver. 2.7 cm low-density area in the upper left lobe, unchanged for many years and felt consistent with a hemangioma on previous studies. Previous cholecystectomy. Pancreas: Mild retroperitoneal edema surrounding the pancreas, probably indicating early pancreatitis. Spleen: Normal Adrenals/Urinary Tract: Adrenal glands are normal. Kidneys show a 2.5 cm cyst at the lower pole on the left and an exophytic left lower pole lesion measuring a cm which has been present for a number of years but has become more dense, most consistent with a hyperdense cyst. Nonobstructing 1 cm stone in the midportion of the left kidney. 2 cm region in the upper pole of left kidney as seen previously, which is indeterminate for a complicated cyst versus a renal mass. This has not visibly enlarged since the previous study. MRI would be  suggested for characterization. The bladder appears normal. Stomach/Bowel: Possible duodenitis. Edema in that region may simply be secondary to the adjacent pancreatitis. No other bowel finding of note. Diverticulosis without evidence of diverticulitis. Vascular/Lymphatic: Aortic atherosclerosis. Maximal diameter of the infrarenal aorta 2.5 cm. IVC negative. No adenopathy. Reproductive: Enlarged prostate. Other: Large left inguinal hernia containing only fat. Small amount of ascites secondary to the pancreatitis. Musculoskeletal: Chronic degenerative changes affect the spine. 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 IMPRESSION: Acute pancreatitis.  No evidence of pseudocyst or abscess. Right lower lobe atelectasis and or pneumonia. Fatty liver.  Chronic hemangioma left lobe. Nonobstructing 1 cm left renal stone. Hyperdense cyst lower pole left kidney. Indistinct 2 cm region in the upper pole of the left kidney, similar to the study of 11 months ago, indeterminate. This could possibly represent a renal mass lesion. Renal MRI would be suggested electively. Aortic atherosclerosis. Left inguinal hernia containing only fat. 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 Enlarged prostate. Electronically Signed   By: Nelson Chimes M.D.   On: 01/23/2019 19:37   Dg Chest Portable 1 View  Result Date: 01/23/2019 CLINICAL DATA:  Tachypnea.  Abnormal right lower lobe at CT. EXAM: PORTABLE CHEST 1 VIEW COMPARISON:  CT earlier same day.  Radiography 02/14/2019 FINDINGS: Heart size is normal. Chronic aortic atherosclerosis.  The upper lungs are clear. Mild density at the right lung base could be atelectasis or mild pneumonia. No visible effusion. Old healed rib fractures on the right. IMPRESSION: Abnormal density seen at chest CT at the right lung base is redemonstrated and could be due to volume loss or mild right base pneumonia. Electronically Signed   By: Nelson Chimes M.D.   On: 01/23/2019 20:23    Pending Labs Unresulted  Labs (From admission, onward)    Start     Ordered   01/24/19 0737  Basic metabolic panel  Tomorrow morning,   R     01/23/19 2112   01/24/19 0500  CBC  Tomorrow morning,   R     01/23/19 2112   01/23/19 2110  Culture, blood (x 2)  BLOOD CULTURE X 2,   STAT    Comments:  INITIATE ANTIBIOTICS WITHIN 1 HOUR AFTER BLOOD CULTURES DRAWN.  If unable to obtain blood cultures, call MD immediately regarding antibiotic instructions.    01/23/19 2109   01/23/19 2110  Lactic acid, plasma  STAT Now then every 3 hours,   STAT     01/23/19 2109   01/23/19 2110  Procalcitonin  ONCE - STAT,   R     01/23/19 2109   01/23/19 2000  Lactic acid, plasma  Now then every 2 hours,   STAT     01/23/19 2000          Vitals/Pain Today's Vitals   01/23/19 2002 01/23/19 2002 01/23/19 2003 01/23/19 2130  BP: (!) 177/89   (!) 158/64  Pulse:   91 96  Resp:      Temp:      TempSrc:      SpO2:   94% 94%  Weight:      Height:      PainSc:  5       Isolation Precautions Droplet and Contact precautions  Medications Medications  sodium chloride flush (NS) 0.9 % injection 3 mL (3 mLs Intravenous Not Given 01/23/19 1818)  0.9 %  sodium chloride infusion (has no administration in time range)  cholecalciferol (VITAMIN D) tablet 1,000 Units (has no administration in time range)  oxyCODONE-acetaminophen (PERCOCET/ROXICET) 5-325 MG per tablet 1 tablet (has no administration in time range)  morphine 2 MG/ML injection 0.5 mg (has no administration in time range)  famotidine (PEPCID) IVPB 20 mg in NS 100 mL IVPB (has no administration in time range)  enoxaparin (LOVENOX) injection 40 mg (has no administration in time range)  acetaminophen (TYLENOL) tablet 650 mg (has no administration in time range)    Or  acetaminophen (TYLENOL) suppository 650 mg (has no administration in time range)  ondansetron (ZOFRAN) tablet 4 mg (has no administration in time range)    Or  ondansetron (ZOFRAN) injection 4 mg (has no  administration in time range)  hydrALAZINE (APRESOLINE) injection 5 mg (has no administration in time range)  sodium chloride 0.9 % bolus 500 mL (has no administration in time range)  lactated ringers bolus 1,000 mL (0 mLs Intravenous Stopped 01/23/19 2004)  iohexol (OMNIPAQUE) 300 MG/ML solution 100 mL (100 mLs Intravenous Contrast Given 01/23/19 1909)  morphine 2 MG/ML injection 2 mg (2 mg Intravenous Given 01/23/19 2004)  sodium chloride 0.9 % bolus 1,000 mL (1,000 mLs Intravenous New Bag/Given 01/23/19 2125)    Mobility walks Low fall risk   Focused Assessments Abdominal Pain   R Recommendations: See Admitting Provider Note  Report given to:   Additional Notes:  Pt is A&O x 4.  HOH.  Pt is a one assist w/ ambulation.

## 2019-01-23 NOTE — ED Provider Notes (Signed)
[facility] EMERGENCY DEPARTMENT Provider Note   CSN: [id number] Arrival date & time: 01/23/19  1700    History   Chief Complaint Chief Complaint  Patient presents with  . Abdominal Pain    HPI Ryan Tyler is a 85 y.o. male.      Abdominal Pain  Pain location:  Generalized Pain quality: aching and burning   Pain radiates to:  Does not radiate Pain severity:  Severe Onset quality:  Gradual Duration:  3 days Timing:  Constant Progression:  Unchanged Chronicity:  New Relieved by:  Movement Associated symptoms: nausea and vomiting   Associated symptoms: no chest pain, no chills, no cough, no dysuria, no fever, no hematuria, no shortness of breath and no sore throat     Past Medical History:  Diagnosis Date  . Asymptomatic cholelithiasis   . Bladder tumor   . BPH (benign prostatic hypertrophy)   . Cancer ([place])    bladder  . GERD (gastroesophageal reflux disease)   . Hearing loss   . HOH (hard of hearing)    no hearing aids  . Hyperlipidemia   . Hypertension   . Kidney stones   . Mild left inguinal hernia   . Nocturia   . Urgency of urination     Patient Active Problem List   Diagnosis Date Noted  . Metabolic acidemia 62/37/6283  . CKD (chronic kidney disease), stage III ([place]) 01/23/2019  . Leukocytosis 01/23/2019  . SIRS (systemic inflammatory response syndrome) ([place]) 01/23/2019  . Acute pancreatitis 01/23/2019  . Hypertension   . Ambulatory dysfunction 03/09/2018  . Renal mass 03/09/2018  . Closed fracture of iliac crest, right, initial encounter ([place]) 03/09/2018  . Fall at home, initial encounter 03/09/2018  . Right hip pain 03/09/2018  . Fall 03/09/2018  . S/P laparoscopic cholecystectomy 08/16/2015    Past Surgical History:  Procedure Laterality Date  . CATARACT EXTRACTION W/ INTRAOCULAR LENS IMPLANT Left nov  2014  . CHOLECYSTECTOMY N/A 08/16/2015   Procedure: LAPAROSCOPIC CHOLECYSTECTOMY;  Surgeon: Rolm Bookbinder, MD;   Location: [facility];  Service: General;  Laterality: N/A;  . TONSILLECTOMY    . TRANSURETHRAL RESECTION OF BLADDER TUMOR WITH GYRUS (TURBT-GYRUS) N/A 04/05/2014   Procedure: TRANSURETHRAL RESECTION OF BLADDER TUMOR WITH GYRUS  AND INTRAVESICO CHEMO;  Surgeon: Claybon Jabs, MD;  Location: [facility];  Service: Urology;  Laterality: N/A;  . [place] Medications    Prior to Admission medications   Medication Sig Start Date End Date Taking? Authorizing Provider  acetaminophen (TYLENOL) 325 MG tablet Take 2 tablets (650 mg total) by mouth every 6 (six) hours as needed for mild pain (or Fever >/= 101). 03/11/18  Yes Geradine Girt, DO  aspirin EC 81 MG tablet Take 81 mg by mouth as needed (for discomfort or headaches).    Yes [provider]  Cholecalciferol (VITAMIN D3 PO) Take 1 capsule by mouth 2 (two) times a week.    Yes [provider]  lisinopril-hydrochlorothiazide (PRINZIDE,ZESTORETIC) 20-25 MG per tablet Take 1 tablet by mouth daily.    Yes [provider]    Family History Family History  Problem Relation Age of Onset  . Cancer Mother   . Leukemia Other     Social History Social History   Tobacco Use  . Smoking status: Never Smoker  . Smokeless tobacco: Former Systems developer    Types: Adult nurse  Topics  . Alcohol use: Yes    Comment: 1 pint liquor most days.    . Drug use: No     Allergies   Patient has no known allergies.   Review of Systems Review of Systems  Constitutional: Negative for chills and fever.  HENT: Negative for ear pain and sore throat.   Eyes: Negative for pain and visual disturbance.  Respiratory: Negative for cough and shortness of breath.   Cardiovascular: Negative for chest pain and palpitations.  Gastrointestinal: Positive for abdominal pain, nausea and vomiting.  Genitourinary: Negative for dysuria and hematuria.  Musculoskeletal: Negative for  arthralgias and back pain.  Skin: Negative for color change and rash.  Neurological: Negative for seizures and syncope.  All other systems reviewed and are negative.    Physical Exam Updated Vital Signs BP (!) 152/70   Pulse 86   Temp 98.9 F (37.2 C) (Oral)   Resp (!) 26   Ht 6' (1.829 m)   Wt 83 kg   SpO2 94%   BMI 24.82 kg/m   Physical Exam Vitals signs and nursing note reviewed.  Constitutional:      Appearance: He is well-developed.  HENT:     Head: Normocephalic and atraumatic.  Eyes:     Conjunctiva/sclera: Conjunctivae normal.  Neck:     Musculoskeletal: Neck supple.  Cardiovascular:     Rate and Rhythm: Normal rate and regular rhythm.     Heart sounds: No murmur.  Pulmonary:     Effort: Pulmonary effort is normal. No respiratory distress.     Breath sounds: Normal breath sounds.  Abdominal:     Comments: Tenderness palpation diffusely throughout the abdomen with most prominent in the right lower quadrant.  Abdomen is soft and nondistended.  There are no peritoneal signs present.  Skin:    General: Skin is warm and dry.  Neurological:     General: No focal deficit present.     Mental Status: He is alert and oriented to person, place, and time.      ED Treatments / Results  Labs (all labs ordered are listed, but only abnormal results are displayed) Labs Reviewed  LIPASE, BLOOD - Abnormal; Notable for the following components:      Result Value   Lipase 894 (*)    All other components within normal limits  COMPREHENSIVE METABOLIC PANEL - Abnormal; Notable for the following components:   Sodium 134 (*)    CO2 17 (*)    Glucose, Bld 179 (*)    Total Protein 6.4 (*)    Alkaline Phosphatase 141 (*)    GFR calc non Af Amer 58 (*)    Anion gap 16 (*)    All other components within normal limits  CBC - Abnormal; Notable for the following components:   WBC 16.3 (*)    All other components within normal limits  URINALYSIS, ROUTINE W REFLEX MICROSCOPIC -  Abnormal; Notable for the following components:   Color, Urine STRAW (*)    Specific Gravity, Urine >1.046 (*)    Hgb urine dipstick SMALL (*)    Ketones, ur 5 (*)    All other components within normal limits  LACTIC ACID, PLASMA - Abnormal; Notable for the following components:   Lactic Acid, Venous 5.3 (*)    All other components within normal limits  POCT I-STAT EG7 - Abnormal; Notable for the following components:   pCO2, Ven 41.4 (*)    pO2, Ven 46.0 (*)  Acid-base deficit 4.0 (*)    Calcium, Ion 1.09 (*)    All other components within normal limits  CULTURE, BLOOD (ROUTINE X 2)  CULTURE, BLOOD (ROUTINE X 2)  TRIGLYCERIDES  LACTIC ACID, PLASMA  LACTIC ACID, PLASMA  LACTIC ACID, PLASMA  PROCALCITONIN  BASIC METABOLIC PANEL  CBC  I-STAT VENOUS BLOOD GAS, ED    EKG None  Radiology Ct Abdomen Pelvis W Contrast  Result Date: 01/23/2019 CLINICAL DATA:  Abdominal pain over the last several days. Intoxicated. EXAM: CT ABDOMEN AND PELVIS WITH CONTRAST TECHNIQUE: Multidetector CT imaging of the abdomen and pelvis was performed using the standard protocol following bolus administration of intravenous contrast. CONTRAST:  157mL OMNIPAQUE IOHEXOL 300 MG/ML  SOLN COMPARISON:  03/09/2018.  Multiple prior CTs.  Liver MRI 05/29/2011. FINDINGS: Lower chest: Atelectasis or patchy infiltrate in the right lower lobe. Hepatobiliary: Fatty liver. 2.7 cm low-density area in the upper left lobe, unchanged for many years and felt consistent with a hemangioma on previous studies. Previous cholecystectomy. Pancreas: Mild retroperitoneal edema surrounding the pancreas, probably indicating early pancreatitis. Spleen: Normal Adrenals/Urinary Tract: Adrenal glands are normal. Kidneys show a 2.5 cm cyst at the lower pole on the left and an exophytic left lower pole lesion measuring a cm which has been present for a number of years but has become more dense, most consistent with a hyperdense cyst.  Nonobstructing 1 cm stone in the midportion of the left kidney. 2 cm region in the upper pole of left kidney as seen previously, which is indeterminate for a complicated cyst versus a renal mass. This has not visibly enlarged since the previous study. MRI would be suggested for characterization. The bladder appears normal. Stomach/Bowel: Possible duodenitis. Edema in that region may simply be secondary to the adjacent pancreatitis. No other bowel finding of note. Diverticulosis without evidence of diverticulitis. Vascular/Lymphatic: Aortic atherosclerosis. Maximal diameter of the infrarenal aorta 2.5 cm. IVC negative. No adenopathy. Reproductive: Enlarged prostate. Other: Large left inguinal hernia containing only fat. Small amount of ascites secondary to the pancreatitis. Musculoskeletal: Chronic degenerative changes affect the spine. 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 IMPRESSION: Acute pancreatitis.  No evidence of pseudocyst or abscess. Right lower lobe atelectasis and or pneumonia. Fatty liver.  Chronic hemangioma left lobe. Nonobstructing 1 cm left renal stone. Hyperdense cyst lower pole left kidney. Indistinct 2 cm region in the upper pole of the left kidney, similar to the study of 11 months ago, indeterminate. This could possibly represent a renal mass lesion. Renal MRI would be suggested electively. Aortic atherosclerosis. Left inguinal hernia containing only fat. 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 Enlarged prostate. Electronically Signed   By: Nelson Chimes M.D.   On: 01/23/2019 19:37   Dg Chest Portable 1 View  Result Date: 01/23/2019 CLINICAL DATA:  Tachypnea.  Abnormal right lower lobe at CT. EXAM: PORTABLE CHEST 1 VIEW COMPARISON:  CT earlier same day.  Radiography 02/14/2019 FINDINGS: Heart size is normal. Chronic aortic atherosclerosis. The upper lungs are clear. Mild density at the right lung base could be atelectasis or mild pneumonia. No visible effusion. Old healed rib fractures  on the right. IMPRESSION: Abnormal density seen at chest CT at the right lung base is redemonstrated and could be due to volume loss or mild right base pneumonia. Electronically Signed   By: Nelson Chimes M.D.   On: 01/23/2019 20:23    Procedures Procedures (including critical care time)  Medications Ordered in ED Medications  sodium chloride flush (NS)  0.9 % injection 3 mL (3 mLs Intravenous Not Given 01/23/19 1818)  0.9 %  sodium chloride infusion (has no administration in time range)  cholecalciferol (VITAMIN D) tablet 1,000 Units (has no administration in time range)  oxyCODONE-acetaminophen (PERCOCET/ROXICET) 5-325 MG per tablet 1 tablet (has no administration in time range)  morphine 2 MG/ML injection 0.5 mg (has no administration in time range)  famotidine (PEPCID) IVPB 20 mg in NS 100 mL IVPB (has no administration in time range)  enoxaparin (LOVENOX) injection 40 mg (has no administration in time range)  acetaminophen (TYLENOL) tablet 650 mg (has no administration in time range)    Or  acetaminophen (TYLENOL) suppository 650 mg (has no administration in time range)  ondansetron (ZOFRAN) tablet 4 mg (has no administration in time range)    Or  ondansetron (ZOFRAN) injection 4 mg (has no administration in time range)  hydrALAZINE (APRESOLINE) injection 5 mg (has no administration in time range)  sodium chloride 0.9 % bolus 500 mL (has no administration in time range)  lactated ringers bolus 1,000 mL (0 mLs Intravenous Stopped 01/23/19 2004)  iohexol (OMNIPAQUE) 300 MG/ML solution 100 mL (100 mLs Intravenous Contrast Given 01/23/19 1909)  morphine 2 MG/ML injection 2 mg (2 mg Intravenous Given 01/23/19 2004)  sodium chloride 0.9 % bolus 1,000 mL (0 mLs Intravenous Stopped 01/23/19 2213)     Initial Impression / Assessment and Plan / ED Course  I have reviewed the triage vital signs and the nursing notes.  Pertinent labs & imaging results that were available during my care of the patient  were reviewed by me and considered in my medical decision making (see chart for details).       Patient is an 85 year old male with history of hyperlipidemia, hypertension, inguinal hernia, urinary retention, and GERD, who presents the emergency department complaining of upper abdominal pain that is been persistent now for the past 3 days.  Patient states pain is been associated with some anorexia as well as nausea vomiting.  He denies any previous similar symptoms in the past.  On initial evaluation of the patient he was hemodynamically stable and nontoxic-appearing.  Patient was afebrile, not tachycardic, mildly hypertensive, satting appropriately on room air.  Physical exam as detailed above which is remarkable for exceedingly hard of hearing elderly male in no significant distress.  Patient has diffuse tenderness palpation throughout the abdomen most prominent in the right lower quadrant.  There are no peritoneal signs present.  Patient was given IV morphine and IV fluid bolus for symptomatic relief while waiting lab and imaging results.  Lab work markable for leukocytosis of 16.3 and hemoglobin within normal limits.  CMP with glucose of 179 and bicarb of 17 with slight increase in anion gap of 16.  Blood gas obtained at this time which showed a normal pH of 7.33.  As anion gap metabolic acidosis is unlikely secondary to elevated blood sugar lactic acid was obtained at this time.  Lipase elevated at 894.  CT scan also consistent with acute pancreatitis with possible lower lobe atelectasis versus pneumonia.  Chest x-ray obtained at this time.  Patient will need admission for poor p.o. intake in the setting of acute pancreatitis.  Case was discussed with Dr. Blaine Hamper who is in agreement with this plan.  Patient was admitted in stable condition while awaiting results of lactic acid as well as chest x-ray.   Final Clinical Impressions(s) / ED Diagnoses   Final diagnoses:  Acute pancreatitis  ED Discharge Orders    None       Tommie Raymond, MD 01/23/19 2214    Merrily Pew, MD 01/25/19 [phone]

## 2019-01-24 ENCOUNTER — Encounter (HOSPITAL_COMMUNITY): Payer: Self-pay | Admitting: *Deleted

## 2019-01-24 DIAGNOSIS — K859 Acute pancreatitis without necrosis or infection, unspecified: Secondary | ICD-10-CM | POA: Diagnosis present

## 2019-01-24 DIAGNOSIS — Z9842 Cataract extraction status, left eye: Secondary | ICD-10-CM | POA: Diagnosis not present

## 2019-01-24 DIAGNOSIS — X58XXXA Exposure to other specified factors, initial encounter: Secondary | ICD-10-CM | POA: Diagnosis not present

## 2019-01-24 DIAGNOSIS — I129 Hypertensive chronic kidney disease with stage 1 through stage 4 chronic kidney disease, or unspecified chronic kidney disease: Secondary | ICD-10-CM | POA: Diagnosis present

## 2019-01-24 DIAGNOSIS — G9341 Metabolic encephalopathy: Secondary | ICD-10-CM | POA: Diagnosis present

## 2019-01-24 DIAGNOSIS — H9190 Unspecified hearing loss, unspecified ear: Secondary | ICD-10-CM | POA: Diagnosis present

## 2019-01-24 DIAGNOSIS — Z961 Presence of intraocular lens: Secondary | ICD-10-CM | POA: Diagnosis present

## 2019-01-24 DIAGNOSIS — J181 Lobar pneumonia, unspecified organism: Secondary | ICD-10-CM | POA: Diagnosis present

## 2019-01-24 DIAGNOSIS — K219 Gastro-esophageal reflux disease without esophagitis: Secondary | ICD-10-CM | POA: Diagnosis present

## 2019-01-24 DIAGNOSIS — D72829 Elevated white blood cell count, unspecified: Secondary | ICD-10-CM | POA: Diagnosis not present

## 2019-01-24 DIAGNOSIS — N400 Enlarged prostate without lower urinary tract symptoms: Secondary | ICD-10-CM | POA: Diagnosis present

## 2019-01-24 DIAGNOSIS — Z20828 Contact with and (suspected) exposure to other viral communicable diseases: Secondary | ICD-10-CM | POA: Diagnosis present

## 2019-01-24 DIAGNOSIS — A419 Sepsis, unspecified organism: Secondary | ICD-10-CM | POA: Diagnosis present

## 2019-01-24 DIAGNOSIS — Z8551 Personal history of malignant neoplasm of bladder: Secondary | ICD-10-CM | POA: Diagnosis not present

## 2019-01-24 DIAGNOSIS — E860 Dehydration: Secondary | ICD-10-CM | POA: Diagnosis present

## 2019-01-24 DIAGNOSIS — E872 Acidosis: Secondary | ICD-10-CM | POA: Diagnosis present

## 2019-01-24 DIAGNOSIS — N183 Chronic kidney disease, stage 3 (moderate): Secondary | ICD-10-CM | POA: Diagnosis present

## 2019-01-24 DIAGNOSIS — N281 Cyst of kidney, acquired: Secondary | ICD-10-CM | POA: Diagnosis present

## 2019-01-24 DIAGNOSIS — Z515 Encounter for palliative care: Secondary | ICD-10-CM | POA: Diagnosis present

## 2019-01-24 DIAGNOSIS — N200 Calculus of kidney: Secondary | ICD-10-CM | POA: Diagnosis present

## 2019-01-24 DIAGNOSIS — E785 Hyperlipidemia, unspecified: Secondary | ICD-10-CM | POA: Diagnosis present

## 2019-01-24 DIAGNOSIS — Z87442 Personal history of urinary calculi: Secondary | ICD-10-CM | POA: Diagnosis not present

## 2019-01-24 DIAGNOSIS — K852 Alcohol induced acute pancreatitis without necrosis or infection: Secondary | ICD-10-CM | POA: Diagnosis present

## 2019-01-24 DIAGNOSIS — Z66 Do not resuscitate: Secondary | ICD-10-CM | POA: Diagnosis present

## 2019-01-24 DIAGNOSIS — F10231 Alcohol dependence with withdrawal delirium: Secondary | ICD-10-CM | POA: Diagnosis present

## 2019-01-24 DIAGNOSIS — Y92230 Patient room in hospital as the place of occurrence of the external cause: Secondary | ICD-10-CM | POA: Diagnosis not present

## 2019-01-24 DIAGNOSIS — Z23 Encounter for immunization: Secondary | ICD-10-CM | POA: Diagnosis present

## 2019-01-24 DIAGNOSIS — D696 Thrombocytopenia, unspecified: Secondary | ICD-10-CM | POA: Diagnosis present

## 2019-01-24 DIAGNOSIS — I10 Essential (primary) hypertension: Secondary | ICD-10-CM | POA: Diagnosis not present

## 2019-01-24 LAB — BASIC METABOLIC PANEL
Anion gap: 11 (ref 5–15)
BUN: 17 mg/dL (ref 8–23)
CO2: 21 mmol/L — ABNORMAL LOW (ref 22–32)
Calcium: 8.3 mg/dL — ABNORMAL LOW (ref 8.9–10.3)
Chloride: 106 mmol/L (ref 98–111)
Creatinine, Ser: 1.13 mg/dL (ref 0.61–1.24)
GFR calc Af Amer: >60 mL/min (ref >60)
GFR calc non Af Amer: 57 mL/min — ABNORMAL LOW (ref >60)
Glucose, Bld: 111 mg/dL — ABNORMAL HIGH (ref 70–99)
Potassium: 4.9 mmol/L (ref 3.5–5.1)
Sodium: 138 mmol/L (ref 135–145)

## 2019-01-24 LAB — LACTIC ACID, PLASMA: Lactic Acid, Venous: 2.6 mmol/L (ref 0.5–1.9)

## 2019-01-24 LAB — CBC
HCT: 48 % (ref 39.0–52.0)
Hemoglobin: 15.8 g/dL (ref 13.0–17.0)
MCH: 31.7 pg (ref 26.0–34.0)
MCHC: 32.9 g/dL (ref 30.0–36.0)
MCV: 96.4 fL (ref 80.0–100.0)
Platelets: 152 10*3/uL (ref 150–400)
RBC: 4.98 MIL/uL (ref 4.22–5.81)
RDW: 13.6 % (ref 11.5–15.5)
WBC: 14.6 10*3/uL — ABNORMAL HIGH (ref 4.0–10.5)
nRBC: 0 % (ref 0.0–0.2)

## 2019-01-24 LAB — MRSA PCR SCREENING: MRSA by PCR: NEGATIVE

## 2019-01-24 LAB — LACTATE DEHYDROGENASE: LDH: 149 U/L (ref 98–192)

## 2019-01-24 LAB — STREP PNEUMONIAE URINARY ANTIGEN: Strep Pneumo Urinary Antigen: NEGATIVE

## 2019-01-24 LAB — C-REACTIVE PROTEIN: CRP: 4.9 mg/dL — ABNORMAL HIGH (ref <1.0)

## 2019-01-24 MED ORDER — FOLIC ACID 1 MG PO TABS
1.0000 mg | ORAL_TABLET | Freq: Every day | ORAL | Status: DC
  Administered 2019-01-24 – 2019-02-01 (×6): 1 mg via ORAL
  Filled 2019-01-24 (×7): qty 1

## 2019-01-24 MED ORDER — IBUPROFEN 600 MG PO TABS
600.0000 mg | ORAL_TABLET | Freq: Once | ORAL | Status: AC
  Administered 2019-01-24: 600 mg via ORAL
  Filled 2019-01-24: qty 1

## 2019-01-24 MED ORDER — THIAMINE HCL 100 MG/ML IJ SOLN
100.0000 mg | Freq: Every day | INTRAMUSCULAR | Status: DC
  Administered 2019-01-25 – 2019-01-30 (×3): 100 mg via INTRAVENOUS
  Filled 2019-01-24 (×3): qty 2

## 2019-01-24 MED ORDER — LORAZEPAM 1 MG PO TABS
0.0000 mg | ORAL_TABLET | Freq: Two times a day (BID) | ORAL | Status: AC
  Administered 2019-01-27: 1 mg via ORAL
  Filled 2019-01-24: qty 2
  Filled 2019-01-24: qty 1
  Filled 2019-01-24: qty 2

## 2019-01-24 MED ORDER — LORAZEPAM 2 MG/ML IJ SOLN
1.0000 mg | Freq: Four times a day (QID) | INTRAMUSCULAR | Status: AC | PRN
  Administered 2019-01-24 – 2019-01-27 (×5): 1 mg via INTRAVENOUS
  Filled 2019-01-24 (×5): qty 1

## 2019-01-24 MED ORDER — LORAZEPAM 1 MG PO TABS
0.0000 mg | ORAL_TABLET | Freq: Four times a day (QID) | ORAL | Status: AC
  Administered 2019-01-24: 4 mg via ORAL
  Administered 2019-01-25 (×2): 1 mg via ORAL
  Filled 2019-01-24 (×2): qty 1
  Filled 2019-01-24: qty 2
  Filled 2019-01-24: qty 4

## 2019-01-24 MED ORDER — PNEUMOCOCCAL VAC POLYVALENT 25 MCG/0.5ML IJ INJ
0.5000 mL | INJECTION | INTRAMUSCULAR | Status: AC
  Administered 2019-01-25: 0.5 mL via INTRAMUSCULAR
  Filled 2019-01-24: qty 0.5

## 2019-01-24 MED ORDER — SODIUM CHLORIDE 0.9 % IV SOLN
INTRAVENOUS | Status: DC
  Administered 2019-01-24: 11:00:00 via INTRAVENOUS

## 2019-01-24 MED ORDER — ADULT MULTIVITAMIN W/MINERALS CH
1.0000 | ORAL_TABLET | Freq: Every day | ORAL | Status: DC
  Administered 2019-01-24 – 2019-02-01 (×6): 1 via ORAL
  Filled 2019-01-24 (×7): qty 1

## 2019-01-24 MED ORDER — VITAMIN B-1 100 MG PO TABS
100.0000 mg | ORAL_TABLET | Freq: Every day | ORAL | Status: DC
  Administered 2019-01-24 – 2019-02-01 (×5): 100 mg via ORAL
  Filled 2019-01-24 (×6): qty 1

## 2019-01-24 MED ORDER — LORAZEPAM 1 MG PO TABS
1.0000 mg | ORAL_TABLET | Freq: Four times a day (QID) | ORAL | Status: AC | PRN
  Filled 2019-01-24: qty 1

## 2019-01-24 NOTE — ED Notes (Signed)
ED TO INPATIENT HANDOFF REPORT  ED Nurse Name and Phone #:   Freida Busman  [phone]  S Name/Age/Gender Ryan Tyler 85 y.o. male Room/Bed: 018C/018C  Code Status   Code Status: Full Code  Home/SNF/Other Home Patient oriented to: self, place, time and situation Is this baseline? Yes   Triage Complete: Triage complete  Chief Complaint Abd pain  Triage Note Pt arrives to ED with c/o of abd pain for the past few days. Pt is bad historian and son is intoxicated on arrival.    Allergies No Known Allergies  Level of Care/Admitting Diagnosis ED Disposition    ED Disposition Condition [place]: [facility] [[id number]]  Level of Care: Progressive [102]  I expect the patient will be discharged within 24 hours: No (not a candidate for 5C-Observation unit)  Diagnosis: Acute pancreatitis [577.0.ICD-9-CM]  Admitting Physician: Ivor Costa [[id number]]  Attending Physician: Ivor Costa [[id number]]  Bed request comments: COVID19 rule out, low risk  PT Class (Do Not Modify): Observation [104]  PT Acc Code (Do Not Modify): Observation [10022]       B Medical/Surgery History Past Medical History:  Diagnosis Date  . Asymptomatic cholelithiasis   . Bladder tumor   . BPH (benign prostatic hypertrophy)   . Cancer ([place])    bladder  . GERD (gastroesophageal reflux disease)   . Hearing loss   . HOH (hard of hearing)    no hearing aids  . Hyperlipidemia   . Hypertension   . Kidney stones   . Mild left inguinal hernia   . Nocturia   . Urgency of urination    Past Surgical History:  Procedure Laterality Date  . CATARACT EXTRACTION W/ INTRAOCULAR LENS IMPLANT Left nov  2014  . CHOLECYSTECTOMY N/A 08/16/2015   Procedure: LAPAROSCOPIC CHOLECYSTECTOMY;  Surgeon: Rolm Bookbinder, MD;  Location: [place];  Service: General;  Laterality: N/A;  . TONSILLECTOMY    . TRANSURETHRAL RESECTION OF BLADDER TUMOR WITH GYRUS (TURBT-GYRUS) N/A 04/05/2014   Procedure:  TRANSURETHRAL RESECTION OF BLADDER TUMOR WITH GYRUS  AND INTRAVESICO CHEMO;  Surgeon: Claybon Jabs, MD;  Location: [facility];  Service: Urology;  Laterality: N/A;  . TRANSURETHRAL RESECTION OF PROSTATE  1995     A IV Location/Drains/Wounds Patient Lines/Drains/Airways Status   Active Line/Drains/Airways    Name:   Placement date:   Placement time:   Site:   Days:   Peripheral IV 01/23/19 Left Antecubital   01/23/19    1818    Antecubital   1   Peripheral IV 01/23/19 Left;Posterior Forearm   01/23/19    2131    Forearm   1   External Urinary Catheter   -    -    -      Pressure Injury 04/15/17 Stage I -  Intact skin with non-blanchable redness of a localized area usually over a bony prominence. redness to bilateral gluteal folds    04/15/17    2016     649          Intake/Output Last 24 hours  Intake/Output Summary (Last 24 hours) at 01/24/2019 0556 Last data filed at 01/24/2019 0436 Gross per 24 hour  Intake 2950 ml  Output 300 ml  Net 2650 ml    Labs/Imaging Results for orders placed or performed during the hospital encounter of 01/23/19 (from the past 48 hour(s))  Lipase, blood     Status: Abnormal   Collection Time: 01/23/19  5:17 PM  Result Value Ref Range   Lipase 894 (H) 11 - 51 U/L    Comment: RESULTS CONFIRMED BY MANUAL DILUTION Performed at [facility], [place] [address], [place], [place] [address]   Comprehensive metabolic panel     Status: Abnormal   Collection Time: 01/23/19  5:17 PM  Result Value Ref Range   Sodium 134 (L) 135 - 145 mmol/L   Potassium 4.3 3.5 - 5.1 mmol/L   Chloride 101 98 - 111 mmol/L   CO2 17 (L) 22 - 32 mmol/L   Glucose, Bld 179 (H) 70 - 99 mg/dL   BUN 17 8 - 23 mg/dL   Creatinine, Ser 1.12 0.61 - 1.24 mg/dL   Calcium 9.1 8.9 - 10.3 mg/dL   Total Protein 6.4 (L) 6.5 - 8.1 g/dL   Albumin 3.8 3.5 - 5.0 g/dL   AST 29 15 - 41 U/L   ALT 17 0 - 44 U/L   Alkaline Phosphatase 141 (H) 38 - 126 U/L   Total Bilirubin 1.1  0.3 - 1.2 mg/dL   GFR calc non Af Amer 58 (L) >60 mL/min   GFR calc Af Amer >60 >60 mL/min   Anion gap 16 (H) 5 - 15    Comment: Performed at [facility], [place] [address] [place], [place] [address]  CBC     Status: Abnormal   Collection Time: 01/23/19  5:17 PM  Result Value Ref Range   WBC 16.3 (H) 4.0 - 10.5 K/uL   RBC 5.25 4.22 - 5.81 MIL/uL   Hemoglobin 16.0 13.0 - 17.0 g/dL   HCT 51.0 39.0 - 52.0 %   MCV 97.1 80.0 - 100.0 fL   MCH 30.5 26.0 - 34.0 pg   MCHC 31.4 30.0 - 36.0 g/dL   RDW 13.5 11.5 - 15.5 %   Platelets 187 150 - 400 K/uL   nRBC 0.0 0.0 - 0.2 %    Comment: Performed at [facility], [place] [address], [place], [place] [address]  Lactic acid, plasma     Status: Abnormal   Collection Time: 01/23/19  8:07 PM  Result Value Ref Range   Lactic Acid, Venous 5.3 (HH) 0.5 - 1.9 mmol/L    Comment: CRITICAL RESULT CALLED TO, READ BACK BY AND VERIFIED WITH: C WARD RN AT 2051 ON [id number] BY Marcos Eke Performed at [facility], [place] [address], [place], [place] [address]   Urinalysis, Routine w reflex microscopic     Status: Abnormal   Collection Time: 01/23/19  8:12 PM  Result Value Ref Range   Color, Urine STRAW (A) YELLOW   APPearance CLEAR CLEAR   Specific Gravity, Urine >1.046 (H) 1.005 - 1.030   pH 5.0 5.0 - 8.0   Glucose, UA NEGATIVE NEGATIVE mg/dL   Hgb urine dipstick SMALL (A) NEGATIVE   Bilirubin Urine NEGATIVE NEGATIVE   Ketones, ur 5 (A) NEGATIVE mg/dL   Protein, ur NEGATIVE NEGATIVE mg/dL   Nitrite NEGATIVE NEGATIVE   Leukocytes,Ua NEGATIVE NEGATIVE   RBC / HPF 6-10 0 - 5 RBC/hpf   WBC, UA 0-5 0 - 5 WBC/hpf   Bacteria, UA NONE SEEN NONE SEEN   Squamous Epithelial / LPF 0-5 0 - 5   Mucus PRESENT     Comment: Performed at [facility], [place] [address], [place], [place] [address]  POCT I-Stat EG7     Status: Abnormal   Collection Time: 01/23/19  8:15 PM  Result Value Ref Range   pH, Ven 7.334 7.250 - 7.430   pCO2, Ven 41.4 (L) 44.0 -  60.0 mmHg   pO2, Ven 46.0 (H) 32.0 - 45.0 mmHg   Bicarbonate 22.1 20.0 - 28.0 mmol/L   TCO2 23 22 - 32 mmol/L   O2 Saturation 78.0 %   Acid-base deficit 4.0 (H) 0.0 - 2.0 mmol/L   Sodium 135 135 - 145 mmol/L   Potassium 4.3 3.5 - 5.1 mmol/L   Calcium, Ion 1.09 (L) 1.15 - 1.40 mmol/L   HCT 48.0 39.0 - 52.0 %   Hemoglobin 16.3 13.0 - 17.0 g/dL   Patient temperature HIDE    Sample type VENOUS   Triglycerides     Status: None   Collection Time: 01/23/19  9:08 PM  Result Value Ref Range   Triglycerides 78 <150 mg/dL    Comment: Performed at [facility] [address] [place], [place] [address]  Lactic acid, plasma     Status: Abnormal   Collection Time: 01/23/19  9:25 PM  Result Value Ref Range   Lactic Acid, Venous 4.9 (HH) 0.5 - 1.9 mmol/L    Comment: CRITICAL RESULT CALLED TO, READ BACK BY AND VERIFIED WITH: [facility] RN 01/23/2019 2229 JORDANS Performed at [facility], [place] [address] [place], [place] [address]   Procalcitonin     Status: None   Collection Time: 01/23/19  9:26 PM  Result Value Ref Range   Procalcitonin <0.10 ng/mL    Comment:        Interpretation: PCT (Procalcitonin) <= 0.5 ng/mL: Systemic infection (sepsis) is not likely. Local bacterial infection is possible. (NOTE)       Sepsis PCT Algorithm           Lower Respiratory Tract                                      Infection PCT Algorithm    ----------------------------     ----------------------------         PCT < 0.25 ng/mL                PCT < 0.10 ng/mL         Strongly encourage             Strongly discourage   discontinuation of antibiotics    initiation of antibiotics    ----------------------------     -----------------------------       PCT 0.25 - 0.50 ng/mL            PCT 0.10 - 0.25 ng/mL               OR       >80% decrease in PCT            Discourage initiation of                                            antibiotics      Encourage discontinuation           of antibiotics     ----------------------------     -----------------------------         PCT >= 0.50 ng/mL              PCT 0.26 - 0.50 ng/mL  AND        <80% decrease in PCT             Encourage initiation of                                             antibiotics       Encourage continuation           of antibiotics    ----------------------------     -----------------------------        PCT >= 0.50 ng/mL                  PCT > 0.50 ng/mL               AND         increase in PCT                  Strongly encourage                                      initiation of antibiotics    Strongly encourage escalation           of antibiotics                                     -----------------------------                                           PCT <= 0.25 ng/mL                                                 OR                                        > 80% decrease in PCT                                     Discontinue / Do not initiate                                             antibiotics Performed at [facility], [place] [address], [place], [place] [address]   Influenza panel by PCR (type A & B)     Status: None   Collection Time: 01/23/19 10:55 PM  Result Value Ref Range   Influenza A By PCR NEGATIVE NEGATIVE   Influenza B By PCR NEGATIVE NEGATIVE    Comment: (NOTE) The Xpert Xpress Flu assay is intended as an aid in the diagnosis of  influenza and should not be used as a sole basis for treatment.  This  assay is FDA approved for nasopharyngeal swab specimens only. Nasal  washings and aspirates are unacceptable for Xpert Xpress Flu testing. Performed at [facility], [place] [address], [place], [place] [address]   Lactic acid, plasma     Status: Abnormal   Collection Time: 01/24/19 12:23 AM  Result Value Ref Range   Lactic Acid, Venous 2.6 (HH) 0.5 - 1.9 mmol/L    Comment: CRITICAL RESULT CALLED TO, READ BACK BY AND VERIFIED WITH: [facility] RN 01/24/2019 0105  JORDANS Performed at [facility], [place] [address], [place], [place] [address]   Lactate dehydrogenase     Status: None   Collection Time: 01/24/19 12:23 AM  Result Value Ref Range   LDH 149 98 - 192 U/L    Comment: Performed at [facility], [place] [address], [place], [place] [address]  C-reactive protein     Status: Abnormal   Collection Time: 01/24/19 12:23 AM  Result Value Ref Range   CRP 4.9 (H) <1.0 mg/dL    Comment: Performed at [facility] [address], [place], [place] [address]  Basic metabolic panel     Status: Abnormal   Collection Time: 01/24/19  3:14 AM  Result Value Ref Range   Sodium 138 135 - 145 mmol/L   Potassium 4.9 3.5 - 5.1 mmol/L   Chloride 106 98 - 111 mmol/L   CO2 21 (L) 22 - 32 mmol/L   Glucose, Bld 111 (H) 70 - 99 mg/dL   BUN 17 8 - 23 mg/dL   Creatinine, Ser 1.13 0.61 - 1.24 mg/dL   Calcium 8.3 (L) 8.9 - 10.3 mg/dL   GFR calc non Af Amer 57 (L) >60 mL/min   GFR calc Af Amer >60 >60 mL/min   Anion gap 11 5 - 15    Comment: Performed at [facility] [address] [place], [place] [address]  CBC     Status: Abnormal   Collection Time: 01/24/19  3:14 AM  Result Value Ref Range   WBC 14.6 (H) 4.0 - 10.5 K/uL   RBC 4.98 4.22 - 5.81 MIL/uL   Hemoglobin 15.8 13.0 - 17.0 g/dL   HCT 48.0 39.0 - 52.0 %   MCV 96.4 80.0 - 100.0 fL   MCH 31.7 26.0 - 34.0 pg   MCHC 32.9 30.0 - 36.0 g/dL   RDW 13.6 11.5 - 15.5 %   Platelets 152 150 - 400 K/uL   nRBC 0.0 0.0 - 0.2 %    Comment: Performed at [facility], [place] [address], [place], [place] [address]   Ct Abdomen Pelvis W Contrast  Result Date: 01/23/2019 CLINICAL DATA:  Abdominal pain over the last several days. Intoxicated. EXAM: CT ABDOMEN AND PELVIS WITH CONTRAST TECHNIQUE: Multidetector CT imaging of the abdomen and pelvis was performed using the standard protocol following bolus administration of intravenous contrast. CONTRAST:  146mL OMNIPAQUE IOHEXOL 300 MG/ML  SOLN  COMPARISON:  03/09/2018.  Multiple prior CTs.  Liver MRI 05/29/2011. FINDINGS: Lower chest: Atelectasis or patchy infiltrate in the right lower lobe. Hepatobiliary: Fatty liver. 2.7 cm low-density area in the upper left lobe, unchanged for many years and felt consistent with a hemangioma on previous studies. Previous cholecystectomy. Pancreas: Mild retroperitoneal edema surrounding the pancreas, probably indicating early pancreatitis. Spleen: Normal Adrenals/Urinary Tract: Adrenal glands are normal. Kidneys show a 2.5 cm cyst at the lower pole on the left and an exophytic left lower pole lesion measuring a cm which has been present for a number of years but  has become more dense, most consistent with a hyperdense cyst. Nonobstructing 1 cm stone in the midportion of the left kidney. 2 cm region in the upper pole of left kidney as seen previously, which is indeterminate for a complicated cyst versus a renal mass. This has not visibly enlarged since the previous study. MRI would be suggested for characterization. The bladder appears normal. Stomach/Bowel: Possible duodenitis. Edema in that region may simply be secondary to the adjacent pancreatitis. No other bowel finding of note. Diverticulosis without evidence of diverticulitis. Vascular/Lymphatic: Aortic atherosclerosis. Maximal diameter of the infrarenal aorta 2.5 cm. IVC negative. No adenopathy. Reproductive: Enlarged prostate. Other: Large left inguinal hernia containing only fat. Small amount of ascites secondary to the pancreatitis. Musculoskeletal: Chronic degenerative changes affect the spine. 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 IMPRESSION: Acute pancreatitis.  No evidence of pseudocyst or abscess. Right lower lobe atelectasis and or pneumonia. Fatty liver.  Chronic hemangioma left lobe. Nonobstructing 1 cm left renal stone. Hyperdense cyst lower pole left kidney. Indistinct 2 cm region in the upper pole of the left kidney, similar to the study of 11  months ago, indeterminate. This could possibly represent a renal mass lesion. Renal MRI would be suggested electively. Aortic atherosclerosis. Left inguinal hernia containing only fat. 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 Enlarged prostate. Electronically Signed   By: Nelson Chimes M.D.   On: 01/23/2019 19:37   Dg Chest Portable 1 View  Result Date: 01/23/2019 CLINICAL DATA:  Tachypnea.  Abnormal right lower lobe at CT. EXAM: PORTABLE CHEST 1 VIEW COMPARISON:  CT earlier same day.  Radiography 02/14/2019 FINDINGS: Heart size is normal. Chronic aortic atherosclerosis. The upper lungs are clear. Mild density at the right lung base could be atelectasis or mild pneumonia. No visible effusion. Old healed rib fractures on the right. IMPRESSION: Abnormal density seen at chest CT at the right lung base is redemonstrated and could be due to volume loss or mild right base pneumonia. Electronically Signed   By: Nelson Chimes M.D.   On: 01/23/2019 20:23    Pending Labs Unresulted Labs (From admission, onward)    Start     Ordered   01/24/19 0525  Strep pneumoniae urinary antigen  Add-on,   R     01/24/19 0524   01/24/19 0524  Legionella Pneumophila Serogp 1 Ur Ag  Add-on,   R     01/24/19 0524   01/23/19 2255  Novel Coronavirus, NAA (hospital order; send-out to ref lab)  Once,   R     01/23/19 2255   01/23/19 2223  Culture, sputum-assessment  Once,   R     01/23/19 2223   01/23/19 2223  Gram stain  Once,   R     01/23/19 2223   01/23/19 2110  Culture, blood (x 2)  BLOOD CULTURE X 2,   STAT    Comments:  INITIATE ANTIBIOTICS WITHIN 1 HOUR AFTER BLOOD CULTURES DRAWN.  If unable to obtain blood cultures, call MD immediately regarding antibiotic instructions.    01/23/19 2109   01/23/19 2110  Lactic acid, plasma  STAT Now then every 3 hours,   STAT     01/23/19 2109          Vitals/Pain Today's Vitals   01/24/19 0415 01/24/19 0500 01/24/19 0515 01/24/19 0530  BP: (!) 145/73 132/73 (!) 151/70 (!)  144/74  Pulse: 77 73 71 73  Resp:  (!) 29 (!) 27 (!) 28  Temp:  TempSrc:      SpO2: 92% 92% 92% 92%  Weight:      Height:      PainSc:        Isolation Precautions Droplet and Contact precautions  Medications Medications  sodium chloride flush (NS) 0.9 % injection 3 mL (3 mLs Intravenous Not Given 01/23/19 1818)  0.9 %  sodium chloride infusion ( Intravenous New Bag/Given 01/23/19 2346)  cholecalciferol (VITAMIN D3) tablet 1,000 Units (has no administration in time range)  oxyCODONE-acetaminophen (PERCOCET/ROXICET) 5-325 MG per tablet 1 tablet (has no administration in time range)  morphine 2 MG/ML injection 0.5 mg (has no administration in time range)  famotidine (PEPCID) IVPB 20 mg in NS 100 mL IVPB (0 mg Intravenous Stopped 01/23/19 2345)  enoxaparin (LOVENOX) injection 40 mg (has no administration in time range)  acetaminophen (TYLENOL) tablet 650 mg (650 mg Oral Given 01/23/19 2221)    Or  acetaminophen (TYLENOL) suppository 650 mg ( Rectal See Alternative 01/23/19 2221)  ondansetron (ZOFRAN) tablet 4 mg (has no administration in time range)    Or  ondansetron (ZOFRAN) injection 4 mg (has no administration in time range)  hydrALAZINE (APRESOLINE) injection 5 mg (has no administration in time range)  cefTRIAXone (ROCEPHIN) 1 g in sodium chloride 0.9 % 100 mL IVPB (0 g Intravenous Stopped 01/23/19 2345)  azithromycin (ZITHROMAX) 500 mg in sodium chloride 0.9 % 250 mL IVPB (0 mg Intravenous Stopped 01/24/19 0022)  albuterol (PROVENTIL HFA;VENTOLIN HFA) 108 (90 Base) MCG/ACT inhaler 2 puff (has no administration in time range)  ipratropium (ATROVENT HFA) inhaler 2 puff (2 puffs Inhalation Not Given 01/24/19 0332)  dextromethorphan-guaiFENesin ([place] DM) 30-600 MG per 12 hr tablet 1 tablet (has no administration in time range)  lactated ringers bolus 1,000 mL (0 mLs Intravenous Stopped 01/23/19 2004)  iohexol (OMNIPAQUE) 300 MG/ML solution 100 mL (100 mLs Intravenous Contrast Given 01/23/19  1909)  morphine 2 MG/ML injection 2 mg (2 mg Intravenous Given 01/23/19 2004)  sodium chloride 0.9 % bolus 1,000 mL (0 mLs Intravenous Stopped 01/23/19 2213)  sodium chloride 0.9 % bolus 500 mL (0 mLs Intravenous Stopped 01/23/19 2239)    Mobility walks Low fall risk   Focused Assessments Pulmonary Assessment Handoff:  Lung sounds:   O2 Device: Room Air        R Recommendations: See Admitting Provider Note  Report given to:   Additional Notes:   Pt very HOH, but able to redirect. Able to follow commands, but can be impulsive. Able to stand on own, but should remain a one-person assist. Flu neg, swabbed for COVID-19. Presently on Droplet & Contact only. No cough, SOB, or recent travel. Mild fever only in ED, none reported at home.

## 2019-01-24 NOTE — Progress Notes (Addendum)
PROGRESS NOTE   Ryan Tyler  GHW:[id number]    DOB: 02-18-1928    DOA: 01/23/2019  PCP: Josetta Huddle, MD   I have briefly reviewed patients previous medical records in [facility].  Brief Narrative:  85 year old male, lives alone but son assists him, independent, PMH of HTN, HLD, GERD, stage III CKD, HOH refuses to wear hearing aid, BPH s/p TURP, bladder cancer s/p TRRBT-Gyrus & intravesical chemo 2015, laparoscopic cholecystectomy 2016 presented to [facility] ED on 4/3 due to 2 days history of abdominal pain, nausea and poor oral intake but no vomiting (confirmed with son).  Initially admitted for acute pancreatitis based on CT abdomen finding and elevated lipase.  Subsequently developed low-grade fever, dyspnea and wheezing and chest x-ray showed lobar pneumonia and hence increased risk of Covid-19 which is being ruled out.   Assessment & Plan:   Principal Problem:   Acute pancreatitis Active Problems:   Hypertension   Metabolic acidemia   CKD (chronic kidney disease), stage III (HCC)   Leukocytosis   SIRS (systemic inflammatory response syndrome) (HCC)   Lobar pneumonia (HCC)   Sepsis ([place])   1. Acute alcoholic pancreatitis: Patient volunteers to heavy alcohol intake.  Lipase 894 on admission.  LFTs unremarkable.  CT abdomen confirms acute pancreatitis without pseudocyst or abscess.  I discussed with radiology 4/4, in the absence of biliary dilatation, unlikely to glean further details by RUQ ultrasound and avoiding unnecessary exposure due to Covid-19, RUQ ultrasound canceled. S/p cholecystectomy.  TG normal.  Suspect due to alcohol abuse.  Start clear liquid diet, continue gentle IV fluids for another day, advance diet as tolerated.  Alcohol abstinence counseled. 2. Alcohol dependence with early withdrawal: started CIWA protocol. 3. Essential hypertension: Home oral antihypertensives currently on hold.  Continue PRN IV hydralazine. 4. Metabolic acidosis/lactic acidosis: On admission,  bicarbonate 17, anion gap 16, lactate 5.3 and venous pH 3.34.  Suspect due to alcohol abuse, dehydration and pancreatitis.  Received 2.5 L IV fluid bolus on admission, lipase down to 2.6, bicarbonate up to 21.  Improved. 5. Stage III CKD: Creatinine normal at 1.13. 6. Leukocytosis: Likely related to acute pancreatitis.  Improving.  Sepsis less likely on admission.  Procalcitonin normal. 7. Suspected lobar pneumonia/rule out Covid-19: Patient developed low-grade fever, wheezing and dyspnea in ED.  Chest x-ray and CT abdomen suggestive of right basal atelectasis versus pneumonia.  Started empirically on IV ceftriaxone and azithromycin, bronchodilator inhalers.  Flu panel PCR negative.  MRSA PCR negative. Covid-19 test pending.  Improved.  LDH 149, CRP 4.9, procalcitonin negative. 8. Renal stone and kidney cyst: CT scan incidentaly showed nonobstructing 1 cm left renal stone. Hyperdense cyst lower pole left kidney. Indistinct 2 cm region in the upper pole of the left kidney, similar to the study of 11 months ago, indeterminate. This could possibly represent a renal mass lesion. Need to f/u with PCP  9. GERD: Continue IV Pepcid.   Isolation and PPE: Droplet and contact isolation.  Patient: None.  Provider: Head cover, CAPR, gown, gloves and should cover. DVT prophylaxis: Lovenox Code Status: Full Family Communication: Discussed with son, updated care and answered questions. Disposition: DC home pending clinical improvement.  Patient was admitted as observation.  However patient has acute pancreatitis with ongoing abdominal pain, possible lobar pneumonia and ruling out Covid-19.  He also has early alcohol withdrawal.  He will need at least couple more days in the hospital while his diet is gradually advanced, treated for alcohol withdrawal and ruling out  above.  Hence transitioned to inpatient status.   Consultants:  None  Procedures:  None  Antimicrobials:  IV ceftriaxone and azithromycin 4/3  >   Subjective: Patient hard of hearing.  States that he drinks 3 pints of whiskey per week and some beer, last drink was approximately 3 days PTA.  Upper abdominal pain, better than on admission but not resolved.  States that he actually never had vomiting.  Last BM day prior to admission.  Denies cough, chest pain or dyspnea.  ROS: As above, otherwise negative.  Objective:  Vitals:   01/24/19 0600 01/24/19 0650 01/24/19 0742 01/24/19 0800  BP: (!) 147/77  (!) 159/77 (!) 175/74  Pulse: 75  72 76  Resp: (!) 27  (!) 22 (!) 23  Temp: 99 F (37.2 C)  98.6 F (37 C)   TempSrc: Oral  Oral   SpO2: 91%  96% 94%  Weight:  80.2 kg    Height:  6' (1.829 m)      Examination:  General exam: Pleasant elderly male, moderately built and nourished lying comfortably propped up in bed without distress. Respiratory system: Slightly diminished breath sounds in the bases but rest of lung fields clear to auscultation without wheezing, rhonchi or crackles. Respiratory effort normal. Cardiovascular system: S1 & S2 heard, RRR. No JVD, murmurs, rubs, gallops or clicks. No pedal edema.  Telemetry personally reviewed: Sinus rhythm with BBB morphology. Gastrointestinal system: Abdomen is nondistended, soft.  Mild epigastric and mid quadrant abdominal tenderness without rigidity, guarding or rebound. No organomegaly or masses felt. Normal bowel sounds heard. Central nervous system: Alert and oriented x2. No focal neurological deficits. Extremities: Symmetric 5 x 5 power. Skin: No rashes, lesions or ulcers Psychiatry: Judgement and insight appear impaired. Mood & affect appropriate.     Data Reviewed: I have personally reviewed following labs and imaging studies  CBC: Recent Labs  Lab 01/23/19 1717 01/23/19 2015 01/24/19 0314  WBC 16.3*  --  14.6*  HGB 16.0 16.3 15.8  HCT 51.0 48.0 48.0  MCV 97.1  --  96.4  PLT 187  --  846   Basic Metabolic Panel: Recent Labs  Lab 01/23/19 1717 01/23/19 2015  01/24/19 0314  NA 134* 135 138  K 4.3 4.3 4.9  CL 101  --  106  CO2 17*  --  21*  GLUCOSE 179*  --  111*  BUN 17  --  17  CREATININE 1.12  --  1.13  CALCIUM 9.1  --  8.3*   Liver Function Tests: Recent Labs  Lab 01/23/19 1717  AST 29  ALT 17  ALKPHOS 141*  BILITOT 1.1  PROT 6.4*  ALBUMIN 3.8     Recent Results (from the past 240 hour(s))  MRSA PCR Screening     Status: None   Collection Time: 01/24/19  6:57 AM  Result Value Ref Range Status   MRSA by PCR NEGATIVE NEGATIVE Final    Comment:        The GeneXpert MRSA Assay (FDA approved for NASAL specimens only), is one component of a comprehensive MRSA colonization surveillance program. It is not intended to diagnose MRSA infection nor to guide or monitor treatment for MRSA infections. Performed at [facility], [place] [address], [place], [place] [address]          Radiology Studies: Ct Abdomen Pelvis W Contrast  Result Date: 01/23/2019 CLINICAL DATA:  Abdominal pain over the last several days. Intoxicated. EXAM: CT ABDOMEN AND PELVIS WITH  CONTRAST TECHNIQUE: Multidetector CT imaging of the abdomen and pelvis was performed using the standard protocol following bolus administration of intravenous contrast. CONTRAST:  124mL OMNIPAQUE IOHEXOL 300 MG/ML  SOLN COMPARISON:  03/09/2018.  Multiple prior CTs.  Liver MRI 05/29/2011. FINDINGS: Lower chest: Atelectasis or patchy infiltrate in the right lower lobe. Hepatobiliary: Fatty liver. 2.7 cm low-density area in the upper left lobe, unchanged for many years and felt consistent with a hemangioma on previous studies. Previous cholecystectomy. Pancreas: Mild retroperitoneal edema surrounding the pancreas, probably indicating early pancreatitis. Spleen: Normal Adrenals/Urinary Tract: Adrenal glands are normal. Kidneys show a 2.5 cm cyst at the lower pole on the left and an exophytic left lower pole lesion measuring a cm which has been present for a number of years but has  become more dense, most consistent with a hyperdense cyst. Nonobstructing 1 cm stone in the midportion of the left kidney. 2 cm region in the upper pole of left kidney as seen previously, which is indeterminate for a complicated cyst versus a renal mass. This has not visibly enlarged since the previous study. MRI would be suggested for characterization. The bladder appears normal. Stomach/Bowel: Possible duodenitis. Edema in that region may simply be secondary to the adjacent pancreatitis. No other bowel finding of note. Diverticulosis without evidence of diverticulitis. Vascular/Lymphatic: Aortic atherosclerosis. Maximal diameter of the infrarenal aorta 2.5 cm. IVC negative. No adenopathy. Reproductive: Enlarged prostate. Other: Large left inguinal hernia containing only fat. Small amount of ascites secondary to the pancreatitis. Musculoskeletal: Chronic degenerative changes affect the spine. 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 IMPRESSION: Acute pancreatitis.  No evidence of pseudocyst or abscess. Right lower lobe atelectasis and or pneumonia. Fatty liver.  Chronic hemangioma left lobe. Nonobstructing 1 cm left renal stone. Hyperdense cyst lower pole left kidney. Indistinct 2 cm region in the upper pole of the left kidney, similar to the study of 11 months ago, indeterminate. This could possibly represent a renal mass lesion. Renal MRI would be suggested electively. Aortic atherosclerosis. Left inguinal hernia containing only fat. 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 Enlarged prostate. Electronically Signed   By: Nelson Chimes M.D.   On: 01/23/2019 19:37   Dg Chest Portable 1 View  Result Date: 01/23/2019 CLINICAL DATA:  Tachypnea.  Abnormal right lower lobe at CT. EXAM: PORTABLE CHEST 1 VIEW COMPARISON:  CT earlier same day.  Radiography 02/14/2019 FINDINGS: Heart size is normal. Chronic aortic atherosclerosis. The upper lungs are clear. Mild density at the right lung base could be atelectasis or mild  pneumonia. No visible effusion. Old healed rib fractures on the right. IMPRESSION: Abnormal density seen at chest CT at the right lung base is redemonstrated and could be due to volume loss or mild right base pneumonia. Electronically Signed   By: Nelson Chimes M.D.   On: 01/23/2019 20:23        Scheduled Meds:  [START ON 01/26/2019] cholecalciferol  1,000 Units Oral Once per day on Mon Thu   enoxaparin (LOVENOX) injection  40 mg Subcutaneous Q24H   famotidine (PEPCID) IV  20 mg Intravenous T41D   folic acid  1 mg Oral Daily   ipratropium  2 puff Inhalation Q4H   LORazepam  0-4 mg Oral Q6H   Followed by   Derrill Memo ON 01/26/2019] LORazepam  0-4 mg Oral Q12H   multivitamin with minerals  1 tablet Oral Daily   [START ON 01/25/2019] pneumococcal 23 valent vaccine  0.5 mL Intramuscular Tomorrow-1000   sodium chloride flush  3 mL Intravenous Once   thiamine  100 mg Oral Daily   Or   thiamine  100 mg Intravenous Daily   Continuous Infusions:  sodium chloride 50 mL/hr at 01/24/19 1048   azithromycin Stopped (01/24/19 0022)   cefTRIAXone (ROCEPHIN)  IV Stopped (01/23/19 2345)     LOS: 0 days     Vernell Leep, MD, FACP, [facility]. [facility]  To contact the attending provider between 7A-7P or the covering provider during after hours 7P-7A, please log into the web site [email or web address] and access using universal [place] password for that web site. If you do not have the password, please call the hospital operator.  01/24/2019, 12:14 PM

## 2019-01-24 NOTE — Progress Notes (Signed)
The patient is admitted to [address]. A & O x 4. Denied any acute pain at this time. Patient is oriented to his room, ascom/call bell and staff. Fall risk assessment reviewed with patient and patient agreed to call before getting up. Bed alarm activated and the bed is in the lowest position. Staff will continue to monitor.

## 2019-01-25 LAB — COMPREHENSIVE METABOLIC PANEL
ALT: UNDETERMINED U/L (ref 0–44)
AST: 17 U/L (ref 15–41)
Albumin: 2.6 g/dL — ABNORMAL LOW (ref 3.5–5.0)
Alkaline Phosphatase: 89 U/L (ref 38–126)
Anion gap: 6 (ref 5–15)
BUN: 16 mg/dL (ref 8–23)
CO2: 24 mmol/L (ref 22–32)
Calcium: 8.1 mg/dL — ABNORMAL LOW (ref 8.9–10.3)
Chloride: 107 mmol/L (ref 98–111)
Creatinine, Ser: 1.04 mg/dL (ref 0.61–1.24)
GFR calc Af Amer: >60 mL/min (ref >60)
GFR calc non Af Amer: >60 mL/min (ref >60)
Glucose, Bld: 111 mg/dL — ABNORMAL HIGH (ref 70–99)
Potassium: 4 mmol/L (ref 3.5–5.1)
Sodium: 137 mmol/L (ref 135–145)
Total Bilirubin: UNDETERMINED mg/dL (ref 0.3–1.2)
Total Protein: 4.8 g/dL — ABNORMAL LOW (ref 6.5–8.1)

## 2019-01-25 LAB — LEGIONELLA PNEUMOPHILA SEROGP 1 UR AG: L. pneumophila Serogp 1 Ur Ag: NEGATIVE

## 2019-01-25 LAB — CBC
HCT: 47 % (ref 39.0–52.0)
Hemoglobin: 15.3 g/dL (ref 13.0–17.0)
MCH: 31.4 pg (ref 26.0–34.0)
MCHC: 32.6 g/dL (ref 30.0–36.0)
MCV: 96.3 fL (ref 80.0–100.0)
Platelets: 113 10*3/uL — ABNORMAL LOW (ref 150–400)
RBC: 4.88 MIL/uL (ref 4.22–5.81)
RDW: 14 % (ref 11.5–15.5)
WBC: 12.6 10*3/uL — ABNORMAL HIGH (ref 4.0–10.5)
nRBC: 0 % (ref 0.0–0.2)

## 2019-01-25 LAB — LIPASE, BLOOD: Lipase: 44 U/L (ref 11–51)

## 2019-01-25 NOTE — Progress Notes (Addendum)
PROGRESS NOTE   Ryan Tyler  ZOX:[id number]    DOB: 08/12/1928    DOA: 01/23/2019  PCP: Josetta Huddle, MD   I have briefly reviewed patients previous medical records in [facility].  Brief Narrative:  85 year old male, lives alone but son assists him, independent, PMH of HTN, HLD, GERD, stage III CKD, HOH refuses to wear hearing aid, BPH s/p TURP, bladder cancer s/p TRRBT-Gyrus & intravesical chemo 2015, laparoscopic cholecystectomy 2016 presented to [facility] ED on 4/3 due to 2 days history of abdominal pain, nausea and poor oral intake but no vomiting (confirmed with son).  Initially admitted for acute pancreatitis based on CT abdomen finding and elevated lipase.  Subsequently developed low-grade fever, dyspnea and wheezing and chest x-ray showed lobar pneumonia and hence increased risk of Covid-19 which is being ruled out.  Improving.   Assessment & Plan:   Principal Problem:   Acute pancreatitis Active Problems:   Hypertension   Metabolic acidemia   CKD (chronic kidney disease), stage III (HCC)   Leukocytosis   SIRS (systemic inflammatory response syndrome) (HCC)   Lobar pneumonia (HCC)   Sepsis ([place])   1. Acute alcoholic pancreatitis: Patient volunteers to heavy alcohol intake.  Lipase 894 on admission, normalized on 4/5.  LFTs unremarkable.  CT abdomen confirms acute pancreatitis without pseudocyst or abscess.  I discussed with radiology 4/4, in the absence of biliary dilatation, unlikely to glean further details by RUQ ultrasound and avoiding unnecessary exposure due to Covid-19 pandemic, RUQ ultrasound canceled. S/p cholecystectomy.  TG normal.  Suspect due to alcohol abuse. Alcohol abstinence counseled.  Tolerated liquid diet, no further abdominal pain, advance to low-fat diet. 2. Alcohol dependence with early withdrawal: started CIWA protocol. High CIWA score/24 today morning, improved. 3. Essential hypertension: Home oral antihypertensives currently on hold.  Continue PRN IV  hydralazine.  Mildly uncontrolled. 4. Metabolic acidosis/lactic acidosis: On admission, bicarbonate 17, anion gap 16, lactate 5.3 and venous pH 3.34.  Suspect due to alcohol abuse, dehydration and pancreatitis.  Received 2.5 L IV fluid bolus on admission, lipase down to 2.6, bicarbonate up to 21.  Metabolic acidosis resolved. 5. Stage III CKD: Creatinine normal.  Stable. 6. Leukocytosis: Likely related to acute pancreatitis.  Improving.  Sepsis less likely on admission.  Procalcitonin normal. 7. Suspected lobar pneumonia/rule out Covid-19: Patient developed low-grade fever, wheezing and dyspnea in ED.  Chest x-ray and CT abdomen suggestive of right basal atelectasis versus pneumonia.  Started empirically on IV ceftriaxone and azithromycin-Day 3 today, bronchodilator inhalers.  Flu panel PCR negative.  MRSA PCR negative. Covid-19 test pending.  LDH 149, CRP 4.9, procalcitonin negative.  Spiked fever of 102 F last night,?  Related to pneumonia versus pancreatitis versus concern for Covid-19. 8. Renal stone and kidney cyst: CT scan incidentaly showed nonobstructing 1 cm left renal stone. Hyperdense cyst lower pole left kidney. Indistinct 2 cm region in the upper pole of the left kidney, similar to the study of 11 months ago, indeterminate. This could possibly represent a renal mass lesion. Need to f/u with PCP  9. GERD: Continue IV Pepcid. 10. Thrombocytopenia: Suspect due to alcohol abuse.  Follow CBC in a.m.   Isolation and PPE: Droplet and contact isolation.  Patient: None.  Provider: Head cover, CAPR, gown, gloves and should cover. DVT prophylaxis: Lovenox Code Status: Full Family Communication: Discussed in detail with patient's son, updated care and answered questions. Disposition: DC home pending clinical improvement.   Consultants:  None  Procedures:  None  Antimicrobials:  IV ceftriaxone and azithromycin 4/3 >   Subjective: Patient reports feeling better.  Extremely hard of  hearing.  Difficult history thereby.  Denies abdominal pain.  Tolerating liquid diet.  As per nursing, received Ativan earlier this morning due to some agitation.  Better since.  Overnight fever of 102 F.  ROS: As above, otherwise negative.  Objective:  Vitals:   01/24/19 2100 01/25/19 0002 01/25/19 0338 01/25/19 0749  BP: (!) 158/87  131/79 (!) 154/84  Pulse: 80   77  Resp: (!) 22   12  Temp: 98.2 F (36.8 C) 98.4 F (36.9 C) 98.2 F (36.8 C) 97.9 F (36.6 C)  TempSrc: Oral Oral Oral Oral  SpO2: 97% 100% 97% 100%  Weight:      Height:        Examination:  General exam: Pleasant elderly male, moderately built and nourished lying comfortably propped up in bed without distress.  Oral mucosa moist. Respiratory system: Slightly diminished breath sounds in the bases but otherwise clear to auscultation.  No wheezing, rhonchi or crackles.  No increased work of breathing and able to speak in full sentences. Cardiovascular system: S1 & S2 heard, RRR. No JVD, murmurs, rubs, gallops or clicks. No pedal edema.  Stable. Gastrointestinal system: Abdomen is nondistended, soft and nontender. No organomegaly or masses felt. Normal bowel sounds heard. Central nervous system: Alert and oriented x2. No focal neurological deficits.  Extremely hard of hearing. Extremities: Symmetric 5 x 5 power. Skin: No rashes, lesions or ulcers Psychiatry: Judgement and insight appear impaired. Mood & affect appropriate.     Data Reviewed: I have personally reviewed following labs and imaging studies  CBC: Recent Labs  Lab 01/23/19 1717 01/23/19 2015 01/24/19 0314 01/25/19 0857  WBC 16.3*  --  14.6* 12.6*  HGB 16.0 16.3 15.8 15.3  HCT 51.0 48.0 48.0 47.0  MCV 97.1  --  96.4 96.3  PLT 187  --  152 983*   Basic Metabolic Panel: Recent Labs  Lab 01/23/19 1717 01/23/19 2015 01/24/19 0314 01/25/19 0857  NA 134* 135 138 137  K 4.3 4.3 4.9 4.0  CL 101  --  106 107  CO2 17*  --  21* 24  GLUCOSE 179*   --  111* 111*  BUN 17  --  17 16  CREATININE 1.12  --  1.13 1.04  CALCIUM 9.1  --  8.3* 8.1*   Liver Function Tests: Recent Labs  Lab 01/23/19 1717 01/25/19 0857  AST 29 17  ALT 17 QUANTITY NOT SUFFICIENT, UNABLE TO PERFORM TEST  ALKPHOS 141* 89  BILITOT 1.1 QUANTITY NOT SUFFICIENT, UNABLE TO PERFORM TEST  PROT 6.4* 4.8*  ALBUMIN 3.8 2.6*     Recent Results (from the past 240 hour(s))  Culture, blood (x 2)     Status: None (Preliminary result)   Collection Time: 01/23/19  9:10 PM  Result Value Ref Range Status   Specimen Description BLOOD RIGHT ANTECUBITAL  Final   Special Requests   Final    BOTTLES DRAWN AEROBIC AND ANAEROBIC Blood Culture results may not be optimal due to an inadequate volume of blood received in culture bottles   Culture   Final    NO GROWTH < 24 HOURS Performed at [facility], [place] [address] [place], [place] [address]    Report Status PENDING  Incomplete  Culture, blood (x 2)     Status: None (Preliminary result)   Collection Time: 01/23/19  9:26 PM  Result Value Ref Range Status   Specimen Description BLOOD BLOOD RIGHT FOREARM  Final   Special Requests   Final    BOTTLES DRAWN AEROBIC AND ANAEROBIC Blood Culture results may not be optimal due to an inadequate volume of blood received in culture bottles   Culture   Final    NO GROWTH < 24 HOURS Performed at [facility] [address], [place], [place] [address]    Report Status PENDING  Incomplete  MRSA PCR Screening     Status: None   Collection Time: 01/24/19  6:57 AM  Result Value Ref Range Status   MRSA by PCR NEGATIVE NEGATIVE Final    Comment:        The GeneXpert MRSA Assay (FDA approved for NASAL specimens only), is one component of a comprehensive MRSA colonization surveillance program. It is not intended to diagnose MRSA infection nor to guide or monitor treatment for MRSA infections. Performed at [facility], [place] [address], [place], [place] [address]            Radiology Studies: Ct Abdomen Pelvis W Contrast  Result Date: 01/23/2019 CLINICAL DATA:  Abdominal pain over the last several days. Intoxicated. EXAM: CT ABDOMEN AND PELVIS WITH CONTRAST TECHNIQUE: Multidetector CT imaging of the abdomen and pelvis was performed using the standard protocol following bolus administration of intravenous contrast. CONTRAST:  163mL OMNIPAQUE IOHEXOL 300 MG/ML  SOLN COMPARISON:  03/09/2018.  Multiple prior CTs.  Liver MRI 05/29/2011. FINDINGS: Lower chest: Atelectasis or patchy infiltrate in the right lower lobe. Hepatobiliary: Fatty liver. 2.7 cm low-density area in the upper left lobe, unchanged for many years and felt consistent with a hemangioma on previous studies. Previous cholecystectomy. Pancreas: Mild retroperitoneal edema surrounding the pancreas, probably indicating early pancreatitis. Spleen: Normal Adrenals/Urinary Tract: Adrenal glands are normal. Kidneys show a 2.5 cm cyst at the lower pole on the left and an exophytic left lower pole lesion measuring a cm which has been present for a number of years but has become more dense, most consistent with a hyperdense cyst. Nonobstructing 1 cm stone in the midportion of the left kidney. 2 cm region in the upper pole of left kidney as seen previously, which is indeterminate for a complicated cyst versus a renal mass. This has not visibly enlarged since the previous study. MRI would be suggested for characterization. The bladder appears normal. Stomach/Bowel: Possible duodenitis. Edema in that region may simply be secondary to the adjacent pancreatitis. No other bowel finding of note. Diverticulosis without evidence of diverticulitis. Vascular/Lymphatic: Aortic atherosclerosis. Maximal diameter of the infrarenal aorta 2.5 cm. IVC negative. No adenopathy. Reproductive: Enlarged prostate. Other: Large left inguinal hernia containing only fat. Small amount of ascites secondary to the pancreatitis. Musculoskeletal:  Chronic degenerative changes affect the spine. 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 IMPRESSION: Acute pancreatitis.  No evidence of pseudocyst or abscess. Right lower lobe atelectasis and or pneumonia. Fatty liver.  Chronic hemangioma left lobe. Nonobstructing 1 cm left renal stone. Hyperdense cyst lower pole left kidney. Indistinct 2 cm region in the upper pole of the left kidney, similar to the study of 11 months ago, indeterminate. This could possibly represent a renal mass lesion. Renal MRI would be suggested electively. Aortic atherosclerosis. Left inguinal hernia containing only fat. 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 Enlarged prostate. Electronically Signed   By: Nelson Chimes M.D.   On: 01/23/2019 19:37   Dg Chest Portable 1 View  Result Date: 01/23/2019  CLINICAL DATA:  Tachypnea.  Abnormal right lower lobe at CT. EXAM: PORTABLE CHEST 1 VIEW COMPARISON:  CT earlier same day.  Radiography 02/14/2019 FINDINGS: Heart size is normal. Chronic aortic atherosclerosis. The upper lungs are clear. Mild density at the right lung base could be atelectasis or mild pneumonia. No visible effusion. Old healed rib fractures on the right. IMPRESSION: Abnormal density seen at chest CT at the right lung base is redemonstrated and could be due to volume loss or mild right base pneumonia. Electronically Signed   By: Nelson Chimes M.D.   On: 01/23/2019 20:23        Scheduled Meds:  [START ON 01/26/2019] cholecalciferol  1,000 Units Oral Once per day on Mon Thu   enoxaparin (LOVENOX) injection  40 mg Subcutaneous Q24H   famotidine (PEPCID) IV  20 mg Intravenous P49I   folic acid  1 mg Oral Daily   ipratropium  2 puff Inhalation Q4H   LORazepam  0-4 mg Oral Q6H   Followed by   Derrill Memo ON 01/26/2019] LORazepam  0-4 mg Oral Q12H   multivitamin with minerals  1 tablet Oral Daily   thiamine  100 mg Oral Daily   Or   thiamine  100 mg Intravenous Daily   Continuous Infusions:  azithromycin Stopped  (01/25/19 0747)   cefTRIAXone (ROCEPHIN)  IV Stopped (01/25/19 0747)     LOS: 1 day     Vernell Leep, MD, FACP, [facility]. [facility]  To contact the attending provider between 7A-7P or the covering provider during after hours 7P-7A, please log into the web site [email or web address] and access using universal [place] password for that web site. If you do not have the password, please call the hospital operator.  01/25/2019, 12:31 PM

## 2019-01-26 LAB — CBC
HCT: 46.8 % (ref 39.0–52.0)
Hemoglobin: 14.5 g/dL (ref 13.0–17.0)
MCH: 30.3 pg (ref 26.0–34.0)
MCHC: 31 g/dL (ref 30.0–36.0)
MCV: 97.7 fL (ref 80.0–100.0)
Platelets: 129 10*3/uL — ABNORMAL LOW (ref 150–400)
RBC: 4.79 MIL/uL (ref 4.22–5.81)
RDW: 13.4 % (ref 11.5–15.5)
WBC: 12 10*3/uL — ABNORMAL HIGH (ref 4.0–10.5)
nRBC: 0 % (ref 0.0–0.2)

## 2019-01-26 LAB — NOVEL CORONAVIRUS, NAA (HOSP ORDER, SEND-OUT TO REF LAB; TAT 18-24 HRS): SARS-CoV-2, NAA: NOT DETECTED

## 2019-01-26 MED ORDER — HALOPERIDOL LACTATE 5 MG/ML IJ SOLN
2.0000 mg | Freq: Once | INTRAMUSCULAR | Status: AC
  Administered 2019-01-26: 2 mg via INTRAVENOUS
  Filled 2019-01-26: qty 1

## 2019-01-26 MED ORDER — LORAZEPAM 2 MG/ML IJ SOLN
1.0000 mg | Freq: Once | INTRAMUSCULAR | Status: AC
  Administered 2019-01-26: 1 mg via INTRAVENOUS
  Filled 2019-01-26: qty 1

## 2019-01-26 NOTE — Progress Notes (Addendum)
PT continued to attempt to get OOB without assistance. Several calls from tele sitter. Bilateral wrist restraints ordered. RN has spoke with son several times. The RN has also spoke with son via telephone several times. RN missed several calls from son also. Son was informed of fall as well as MD.

## 2019-01-26 NOTE — Progress Notes (Signed)
Pt resting for main portion of the day. At 1400 RN continued bilateral soft wrist restraints & removed soft waist belt. However, during 5pm rounds pt found to be halfway out of bed despite bed alarm - no call from telesitter. Pt repositioned in bed. Bilateral wrist restraint continued, soft waist belt continued. Bed alarm to be set on more sensitive setting.   Pt continues to ask about his son who is admitted. This RN did speak to the son's RN who stated he was sleeping, but would help him to call when able.   Pt agitated by restraint - CIWA score of 9 at time of event. CIWA earlier in the day have been 4 and 5. RN stayed at bedside to allow pt to feed himself without R soft wrist restraint. Will reassess CIWA at 6pm scheduled time and administer Ativan as appropriate.   Loel Dubonnet, RN

## 2019-01-26 NOTE — Progress Notes (Signed)
PROGRESS NOTE   Ryan Tyler  LYY:[id number]    DOB: [birth year]    DOA: 01/23/2019  PCP: Josetta Huddle, MD   I have briefly reviewed patients previous medical records in [facility].  Brief Narrative:  85 year old male, lives alone but son assists him, independent, PMH of HTN, HLD, GERD, stage III CKD, HOH refuses to wear hearing aid, BPH s/p TURP, bladder cancer s/p TRRBT-Gyrus & intravesical chemo 2015, laparoscopic cholecystectomy 2016 presented to [facility] ED on 4/3 due to 2 days history of abdominal pain, nausea and poor oral intake but no vomiting (confirmed with son).  Initially admitted for acute pancreatitis based on CT abdomen finding and elevated lipase.  Subsequently developed low-grade fever, dyspnea and wheezing and chest x-ray showed lobar pneumonia and hence increased risk of Covid-19 which is being ruled out.  Respiratory status is improved.  Ongoing mental status changes, possibly related to alcohol withdrawal complicating possible underlying dementia.   Assessment & Plan:   Principal Problem:   Acute pancreatitis Active Problems:   Hypertension   Metabolic acidemia   CKD (chronic kidney disease), stage III (HCC)   Leukocytosis   SIRS (systemic inflammatory response syndrome) (HCC)   Lobar pneumonia (HCC)   Sepsis ([place])   1. Acute alcoholic pancreatitis: Patient volunteers to heavy alcohol intake.  Lipase 894 on admission, normalized on 4/5.  LFTs unremarkable.  CT abdomen confirms acute pancreatitis without pseudocyst or abscess.  I discussed with radiology 4/4, in the absence of biliary dilatation, unlikely to glean further details by RUQ ultrasound and avoiding unnecessary exposure due to Covid-19 pandemic, RUQ ultrasound canceled. S/p cholecystectomy.  TG normal.  Suspect due to alcohol abuse. Alcohol abstinence counseled.  As per RN report, tolerated low-fat diet without complaints. 2. Alcohol dependence with early withdrawal: started CIWA protocol. CIWA scores 9  yesterday, down to 4-5.  Continue management. 3. Essential hypertension: Home oral antihypertensives currently on hold.  Continue PRN IV hydralazine.  Mildly uncontrolled.  4. Metabolic acidosis/lactic acidosis: On admission, bicarbonate 17, anion gap 16, lactate 5.3 and venous pH 3.34.  Suspect due to alcohol abuse, dehydration and pancreatitis.  Received 2.5 L IV fluid bolus on admission, lipase down to 2.6, bicarbonate up to 21.  Metabolic acidosis resolved. 5. Stage III CKD: Creatinine normal.  Stable. 6. Leukocytosis: Likely related to acute pancreatitis.  Improving.  Sepsis less likely on admission.  Procalcitonin normal. 7. Suspected lobar pneumonia/rule out Covid-19: Patient developed low-grade fever, wheezing and dyspnea in ED.  Chest x-ray and CT abdomen suggestive of right basal atelectasis versus pneumonia.  Started empirically on IV ceftriaxone and azithromycin-Day 4 today, bronchodilator inhalers.  Flu panel PCR negative.  MRSA PCR negative. Covid-19 test pending.  LDH 149, CRP 4.9, procalcitonin negative.  Spiked fever of 102 F 4/4 night,?  Related to pneumonia versus pancreatitis versus concern for Covid-19.  Improving.  Mild leukocytosis, stable.  Defervesced.  Blood cultures x2: Negative to date. 8. Renal stone and kidney cyst: CT scan incidentaly showed nonobstructing 1 cm left renal stone. Hyperdense cyst lower pole left kidney. Indistinct 2 cm region in the upper pole of the left kidney, similar to the study of 11 months ago, indeterminate. This could possibly represent a renal mass lesion. Need to f/u with PCP  9. GERD: Continue IV Pepcid. 10. Thrombocytopenia: Suspect due to alcohol abuse.  Improving.  Continue to follow CBCs periodically. 11. S/P fall: As per chart review, patient sustained a mechanical fall in his hospital room on 4/5 night,  no visible injury, confused.  He has bilateral upper extremity ecchymosis and right groin bruising, reportedly present on admission.  Likely  precipitated by AMS.  Monitor closely and fall precautions. 12. Acute metabolic encephalopathy: Multifactorial, mostly related to alcohol withdrawal, acute illness, hospital delirium complicating possible underlying dementia.  Delirium precautions.   Isolation and PPE: Droplet and contact isolation.  Patient: None.  Provider: Head cover, CAPR, gown, gloves and should cover. DVT prophylaxis: Lovenox Code Status: Full Family Communication: None at bedside.  As per RN report, patient son who was patient's caretaker is also currently hospitalized at [facility] hospital.. Disposition: To be determined pending clinical improvement.   Consultants:  None  Procedures:  None  Antimicrobials:  IV ceftriaxone and azithromycin 4/3 >   Subjective: Patient drowsy and barely arousable.  Received a dose of Ativan at approximately 4 AM today.  Overnight events noted, confusion, mechanical fall without significant injuries.  As per RN, tolerated regular consistency diet without abdominal pain reported.  ROS: As above, otherwise negative.  Objective:  Vitals:   01/25/19 2124 01/26/19 0437 01/26/19 0800 01/26/19 0829  BP: (!) 155/83 (!) 166/80 (!) 161/86   Pulse: 91 74 70   Resp:  18 20   Temp: 99.7 F (37.6 C) 98.7 F (37.1 C) 98.7 F (37.1 C)   TempSrc: Oral Oral Axillary   SpO2: 99% 100% 100% 97%  Weight:      Height:        Examination:  General exam: Pleasant elderly male, moderately built and nourished lying comfortably propped up in bed without distress.  Oral mucosa moist. Respiratory system: Patient is on room air.  Clear to auscultation.  No increased work of breathing. Cardiovascular system: S1 & S2 heard, RRR. No JVD, murmurs, rubs, gallops or clicks. No pedal edema.  Stable. Gastrointestinal system: Abdomen is nondistended, soft and nontender. No organomegaly or masses felt. Normal bowel sounds heard.  Stable. Central nervous system: Drowsy, to call or touch briefly opens eyes and  drifts back to sleep. No focal neurological deficits.  Extremely hard of hearing. Extremities: Moves all extremities spontaneously. Skin: As noted above. Psychiatry: Judgement and insight impaired. Mood & affect cannot be assessed.     Data Reviewed: I have personally reviewed following labs and imaging studies  CBC: Recent Labs  Lab 01/23/19 1717 01/23/19 2015 01/24/19 0314 01/25/19 0857 01/26/19 0715  WBC 16.3*  --  14.6* 12.6* 12.0*  HGB 16.0 16.3 15.8 15.3 14.5  HCT 51.0 48.0 48.0 47.0 46.8  MCV 97.1  --  96.4 96.3 97.7  PLT 187  --  152 113* 542*   Basic Metabolic Panel: Recent Labs  Lab 01/23/19 1717 01/23/19 2015 01/24/19 0314 01/25/19 0857  NA 134* 135 138 137  K 4.3 4.3 4.9 4.0  CL 101  --  106 107  CO2 17*  --  21* 24  GLUCOSE 179*  --  111* 111*  BUN 17  --  17 16  CREATININE 1.12  --  1.13 1.04  CALCIUM 9.1  --  8.3* 8.1*   Liver Function Tests: Recent Labs  Lab 01/23/19 1717 01/25/19 0857  AST 29 17  ALT 17 QUANTITY NOT SUFFICIENT, UNABLE TO PERFORM TEST  ALKPHOS 141* 89  BILITOT 1.1 QUANTITY NOT SUFFICIENT, UNABLE TO PERFORM TEST  PROT 6.4* 4.8*  ALBUMIN 3.8 2.6*     Recent Results (from the past 240 hour(s))  Culture, blood (x 2)     Status: None (Preliminary result)  Collection Time: 01/23/19  9:10 PM  Result Value Ref Range Status   Specimen Description BLOOD RIGHT ANTECUBITAL  Final   Special Requests   Final    BOTTLES DRAWN AEROBIC AND ANAEROBIC Blood Culture results may not be optimal due to an inadequate volume of blood received in culture bottles   Culture   Final    NO GROWTH 3 DAYS Performed at [facility], [place] [address] [place], [place] [address]    Report Status PENDING  Incomplete  Culture, blood (x 2)     Status: None (Preliminary result)   Collection Time: 01/23/19  9:26 PM  Result Value Ref Range Status   Specimen Description BLOOD BLOOD RIGHT FOREARM  Final   Special Requests   Final    BOTTLES DRAWN  AEROBIC AND ANAEROBIC Blood Culture results may not be optimal due to an inadequate volume of blood received in culture bottles   Culture   Final    NO GROWTH 3 DAYS Performed at [facility], [place] [address], [place], [place] [address]    Report Status PENDING  Incomplete  MRSA PCR Screening     Status: None   Collection Time: 01/24/19  6:57 AM  Result Value Ref Range Status   MRSA by PCR NEGATIVE NEGATIVE Final    Comment:        The GeneXpert MRSA Assay (FDA approved for NASAL specimens only), is one component of a comprehensive MRSA colonization surveillance program. It is not intended to diagnose MRSA infection nor to guide or monitor treatment for MRSA infections. Performed at [facility], [place] [address] [place], [place] [address]          Radiology Studies: No results found.      Scheduled Meds:  cholecalciferol  1,000 Units Oral Once per day on Mon Thu   enoxaparin (LOVENOX) injection  40 mg Subcutaneous Q24H   famotidine (PEPCID) IV  20 mg Intravenous O59Y   folic acid  1 mg Oral Daily   ipratropium  2 puff Inhalation Q4H   LORazepam  0-4 mg Oral Q12H   multivitamin with minerals  1 tablet Oral Daily   thiamine  100 mg Oral Daily   Or   thiamine  100 mg Intravenous Daily   Continuous Infusions:  azithromycin 500 mg (01/25/19 2103)   cefTRIAXone (ROCEPHIN)  IV 1 g (01/25/19 2104)     LOS: 2 days     Vernell Leep, MD, FACP, [facility]. [facility]  To contact the attending provider between 7A-7P or the covering provider during after hours 7P-7A, please log into the web site [email or web address] and access using universal [place] password for that web site. If you do not have the password, please call the hospital operator.  01/26/2019, 1:02 PM

## 2019-01-26 NOTE — Progress Notes (Signed)
Notified by tele-monitoring that pt had removed condom cath and wiggling down in bed. When assessed pt, pt had pulled down posey belt and was pulling at soft wrist restraints. Attempted to adjust pt and he became combative and verbally aggressive, yelling at staff to get out of his room cause he doesn't want help.  On-call NP, Baltazar Najjar, paged and made aware. Pt cleaned and four-point restraints applied per order. One time dose of ativan and haldol given per order. Pt continues to be agitated and restless. Will continue to monitor.

## 2019-01-26 NOTE — Progress Notes (Signed)
Pt not coherent enough to understand administration instructions for inhalers. Spoke with RT and Dr. Algis Liming. RT sent aerochamber with facemask attached for administration of inhalers.   Loel Dubonnet, RN

## 2019-01-26 NOTE — Progress Notes (Signed)
Addendum  Discussed w RN.  Covid-19 results negative  Patient alert and oriented to self, able to have simple conversation and keeps asking for his son who reportedly is also hospitalized in [facility]. Tolerated >50% meals without coughing. Mildly elevated BP but other vitals stable, no distress and stable.  Transfer out of 2 W, hopefully to 5W to a room close to his son's which may benefit both if the son can see his dad.  I discussed with patient's son, updated care and answered questions.  Vernell Leep, MD, FACP, [facility]. [facility]  To contact the attending provider between 7A-7P or the covering provider during after hours 7P-7A, please log into the web site [email or web address] and access using universal  password for that web site. If you do not have the password, please call the hospital operator.

## 2019-01-26 NOTE — Progress Notes (Signed)
NURSING PROGRESS NOTE  Ryan Tyler [id number] Transfer Data: 01/26/2019 6:57 PM Attending Provider: Modena Jansky, MD LPN:PYYFR, Herbie [place], MD Code Status: full   Ryan Tyler is a 85 y.o. male patient transferred from 2W  -No acute distress noted.  -No complaints of shortness of breath.  -No complaints of chest pain.    Blood pressure (!) 169/92, pulse 77, temperature 97.7 F (36.5 C), temperature source Oral, resp. rate (!) 30, height 6' (1.829 m), weight 80.2 kg, SpO2 98 %.   IV Fluids:  IV in place, occlusive dsg intact without redness, IV cath antecubital left, condition patent and no redness none.   Allergies:  Patient has no known allergies.  Past Medical History:   has a past medical history of Asymptomatic cholelithiasis, Bladder tumor, BPH (benign prostatic hypertrophy), Cancer ([place]), GERD (gastroesophageal reflux disease), Hearing loss, HOH (hard of hearing), Hyperlipidemia, Hypertension, Kidney stones, Mild left inguinal hernia, Nocturia, and Urgency of urination.  Past Surgical History:   has a past surgical history that includes Transurethral resection of prostate (1995); Cataract extraction w/ intraocular lens implant (Left, nov  2014); Transurethral resection of bladder tumor with gyrus (turbt-gyrus) (N/A, 04/05/2014); Tonsillectomy; and Cholecystectomy (N/A, 08/16/2015).  Social History:   reports that he has never smoked. He quit smokeless tobacco use about 9 years ago.  His smokeless tobacco use included chew. He reports current alcohol use. He reports that he does not use drugs.  Skin: generalized ecchymosis   Patient/Family orientated to room. Information packet given to patient/family. Admission inpatient armband information verified with patient/family to include name and date of birth and placed on patient arm. Side rails up x 2, fall assessment and education completed with patient/family. Patient/family able to verbalize understanding of risk associated with  falls and verbalized understanding to call for assistance before getting out of bed. Call light within reach. Patient/family able to voice and demonstrate understanding of unit orientation instructions.    Will continue to evaluate and treat per MD orders.

## 2019-01-26 NOTE — Progress Notes (Signed)
Around 2155 RN came out of another patients room and charge and nurse tech report that pt has fell. They report that tele sitter called and Nurse Tech immediately went to DON PPE however before Nurse Tech had PPE on pt was already in floor. Tech reports patient was on buttocks leaned up against wall. MD notified and son notified. No visible injury Pt alert and confused as per recent baseline. No visible injury. Pt still had ecchymotic bilateral arms and and 1 bruise to right flank. MD notified and ordered belt restraint.

## 2019-01-27 DIAGNOSIS — K852 Alcohol induced acute pancreatitis without necrosis or infection: Secondary | ICD-10-CM

## 2019-01-27 LAB — CBC
HCT: 45.8 % (ref 39.0–52.0)
Hemoglobin: 14.9 g/dL (ref 13.0–17.0)
MCH: 31.1 pg (ref 26.0–34.0)
MCHC: 32.5 g/dL (ref 30.0–36.0)
MCV: 95.6 fL (ref 80.0–100.0)
Platelets: 152 10*3/uL (ref 150–400)
RBC: 4.79 MIL/uL (ref 4.22–5.81)
RDW: 13.2 % (ref 11.5–15.5)
WBC: 12.8 10*3/uL — ABNORMAL HIGH (ref 4.0–10.5)
nRBC: 0 % (ref 0.0–0.2)

## 2019-01-27 MED ORDER — CHLORDIAZEPOXIDE HCL 5 MG PO CAPS
15.0000 mg | ORAL_CAPSULE | Freq: Three times a day (TID) | ORAL | Status: DC
  Administered 2019-01-27 – 2019-01-28 (×3): 15 mg via ORAL
  Filled 2019-01-27 (×3): qty 3

## 2019-01-27 MED ORDER — SODIUM CHLORIDE 0.9 % IV SOLN
3.0000 g | Freq: Four times a day (QID) | INTRAVENOUS | Status: DC
  Administered 2019-01-27 – 2019-01-29 (×6): 3 g via INTRAVENOUS
  Filled 2019-01-27 (×13): qty 3

## 2019-01-27 MED ORDER — LACTATED RINGERS IV SOLN
INTRAVENOUS | Status: AC
  Administered 2019-01-27: 11:00:00 via INTRAVENOUS

## 2019-01-27 MED ORDER — METOPROLOL TARTRATE 50 MG PO TABS
50.0000 mg | ORAL_TABLET | Freq: Two times a day (BID) | ORAL | Status: DC
  Administered 2019-01-27 – 2019-02-01 (×9): 50 mg via ORAL
  Filled 2019-01-27 (×9): qty 1

## 2019-01-27 MED ORDER — ALBUTEROL SULFATE HFA 108 (90 BASE) MCG/ACT IN AERS
2.0000 | INHALATION_SPRAY | RESPIRATORY_TRACT | Status: DC
  Administered 2019-01-27 – 2019-01-28 (×8): 2 via RESPIRATORY_TRACT
  Filled 2019-01-27: qty 6.7

## 2019-01-27 NOTE — Progress Notes (Signed)
PROGRESS NOTE   Ryan Tyler  KGY:[id number]    DOB: [birth year]    DOA: 01/23/2019  PCP: Josetta Huddle, MD   I have briefly reviewed patients previous medical records in [facility].  Brief Narrative:  85 year old male, lives alone but son assists him, independent, PMH of HTN, HLD, GERD, stage III CKD, HOH refuses to wear hearing aid, BPH s/p TURP, bladder cancer s/p TRRBT-Gyrus & intravesical chemo 2015, laparoscopic cholecystectomy 2016 presented [facility] ED on 4/3 due to 2 days history of abdominal pain, nausea and poor oral intake but no vomiting (confirmed with son).  Initially admitted for acute pancreatitis based on CT abdomen finding and elevated lipase.  Subsequently developed low-grade fever, dyspnea and wheezing and chest x-ray showed lobar pneumonia and hence increased risk of Covid-19 which is being ruled out.  Respiratory status is improved.  Ongoing mental status changes, possibly related to alcohol withdrawal complicating possible underlying dementia.    Subjective:  Somnolent sedated, tied to the bed, unable to answer questions, he went into severe DTs last night and had to be sedated and restrained.  Assessment & Plan:   1. Acute alcoholic pancreatitis: Alcoholic pancreatitis, LFTs stable, CT confirms acute pancreatitis, continue hydration with IV fluids and monitor with supportive care.  Currently sedated but abdominal exam remains benign.  Has been advanced to low-fat diet previously, will resume once mentation has improved. 2. Alcohol dependence with DTs: Placed him on scheduled Librium, low-dose beta-blocker along with CIWA protocol, once mentation has improved will be counseled to quit. 3. Essential hypertension: Placed on oral Lopressor once he is able to tolerate along with PRN IV hydralazine.  4. Metabolic acidosis/lactic acidosis: Combination of dehydration and alcoholic pancreatitis, with hydration and supportive care Metabolic acidosis resolved. 5. Stage III CKD:  Creatinine normal.  Stable. 6. Leukocytosis: Likely related to acute pancreatitis with possible aspiration, no sepsis, switch to Unasyn. 7. Suspected lobar pneumonia/rule out Covid-19: Right lower lobe infiltrate suspicious for aspiration in the setting of DTs, Unasyn, follow cultures, not septic. 8. Renal stone and kidney cyst: CT scan incidentaly showed nonobstructing 1 cm left renal stone. Hyperdense cyst lower pole left kidney. Indistinct 2 cm region in the upper pole of the left kidney, similar to the study of 11 months ago, indeterminate. This could possibly represent a renal mass lesion. Need to f/u with PCP along with outpatient follow-up with urology in a month as recommended post discharge. 9. GERD: Continue IV Pepcid. 10. Thrombocytopenia: Suspect due to alcohol abuse.  Improving.  Continue to follow CBCs periodically. 11. S/P fall: As per chart review by previous MD, patient sustained a mechanical fall in his hospital room on 4/5 night, no visible injury, confused.  He has bilateral upper extremity ecchymosis and right groin bruising, reportedly present on admission.  Likely precipitated by AMS.  Monitor closely and fall precautions. 12. Acute metabolic encephalopathy: Due to DTs treatment as in #2 above.   Isolation and PPE: None Covid ruled out DVT prophylaxis: Lovenox Code Status: Full Family Communication: None at bedside.  As per RN report, patient son who was patient's caretaker is also currently hospitalized at [facility] hospital.. Disposition: MedSurg   Consultants:  None  Procedures:  None  Antimicrobials:  Switch to Unasyn on 01/27/2019   Objective:  Vitals:   01/27/19 0011 01/27/19 0302 01/27/19 0559 01/27/19 0810  BP:   (!) 155/92   Pulse:   (!) 121   Resp:      Temp:  TempSrc:      SpO2: 96% 95% 92% 93%  Weight:      Height:        Examination:  Currently somnolent, Tied to the bed due to severe DTs and agitation,   [place].AT,PERRAL Supple Neck,No JVD, No  cervical lymphadenopathy appriciated.  Symmetrical Chest wall movement, Good air movement bilaterally, CTAB RRR,No Gallops, Rubs or new Murmurs, No Parasternal Heave +ve B.Sounds, Abd Soft, No tenderness, No organomegaly appriciated, No rebound - guarding or rigidity. No Cyanosis, Clubbing or edema, No new Rash or bruise     Data Reviewed: I have personally reviewed following labs and imaging studies  CBC: Recent Labs  Lab 01/23/19 1717 01/23/19 2015 01/24/19 0314 01/25/19 0857 01/26/19 0715 01/27/19 0143  WBC 16.3*  --  14.6* 12.6* 12.0* 12.8*  HGB 16.0 16.3 15.8 15.3 14.5 14.9  HCT 51.0 48.0 48.0 47.0 46.8 45.8  MCV 97.1  --  96.4 96.3 97.7 95.6  PLT 187  --  152 113* 129* 161   Basic Metabolic Panel: Recent Labs  Lab 01/23/19 1717 01/23/19 2015 01/24/19 0314 01/25/19 0857  NA 134* 135 138 137  K 4.3 4.3 4.9 4.0  CL 101  --  106 107  CO2 17*  --  21* 24  GLUCOSE 179*  --  111* 111*  BUN 17  --  17 16  CREATININE 1.12  --  1.13 1.04  CALCIUM 9.1  --  8.3* 8.1*   Liver Function Tests: Recent Labs  Lab 01/23/19 1717 01/25/19 0857  AST 29 17  ALT 17 QUANTITY NOT SUFFICIENT, UNABLE TO PERFORM TEST  ALKPHOS 141* 89  BILITOT 1.1 QUANTITY NOT SUFFICIENT, UNABLE TO PERFORM TEST  PROT 6.4* 4.8*  ALBUMIN 3.8 2.6*     Recent Results (from the past 240 hour(s))  Culture, blood (x 2)     Status: None (Preliminary result)   Collection Time: 01/23/19  9:10 PM  Result Value Ref Range Status   Specimen Description BLOOD RIGHT ANTECUBITAL  Final   Special Requests   Final    BOTTLES DRAWN AEROBIC AND ANAEROBIC Blood Culture results may not be optimal due to an inadequate volume of blood received in culture bottles   Culture   Final    NO GROWTH 4 DAYS Performed at [facility], [place] [address], [place], [place] [address]    Report Status PENDING  Incomplete  Culture, blood (x 2)     Status: None (Preliminary result)   Collection Time: 01/23/19  9:26 PM   Result Value Ref Range Status   Specimen Description BLOOD BLOOD RIGHT FOREARM  Final   Special Requests   Final    BOTTLES DRAWN AEROBIC AND ANAEROBIC Blood Culture results may not be optimal due to an inadequate volume of blood received in culture bottles   Culture   Final    NO GROWTH 4 DAYS Performed at [facility], [place] [address] [place], [place] [address]    Report Status PENDING  Incomplete  Novel Coronavirus, NAA (hospital order; send-out to ref lab)     Status: None   Collection Time: 01/23/19 10:55 PM  Result Value Ref Range Status   SARS-CoV-2, NAA NOT DETECTED NOT DETECTED Final    Comment: Performed at [place]  Final    Comment: Performed at [facility], [place] [address] [place], [place] [address]  MRSA PCR Screening     Status: None  Collection Time: 01/24/19  6:57 AM  Result Value Ref Range Status   MRSA by PCR NEGATIVE NEGATIVE Final    Comment:        The GeneXpert MRSA Assay (FDA approved for NASAL specimens only), is one component of a comprehensive MRSA colonization surveillance program. It is not intended to diagnose MRSA infection nor to guide or monitor treatment for MRSA infections. Performed at [facility], [place] [address], [place], [place] [address]      Radiology Studies: No results found.   Scheduled Meds: . albuterol  2 puff Inhalation Q4H  . chlordiazePOXIDE  15 mg Oral TID  . cholecalciferol  1,000 Units Oral Once per day on Mon Thu  . enoxaparin (LOVENOX) injection  40 mg Subcutaneous Q24H  . famotidine (PEPCID) IV  20 mg Intravenous Q12H  . folic acid  1 mg Oral Daily  . ipratropium  2 puff Inhalation Q4H  . LORazepam  0-4 mg Oral Q12H  . metoprolol tartrate  50 mg Oral BID  . multivitamin with minerals  1 tablet Oral Daily  . thiamine  100 mg Oral Daily   Or  . thiamine  100 mg Intravenous Daily   Continuous Infusions: . lactated ringers       LOS: 3 days    Signature  Lala Lund M.D on 01/27/2019 at 8:31 AM   -  To page go to [email or web address]

## 2019-01-27 NOTE — Progress Notes (Signed)
Pt found to have skin tears on LUA and RFA while bathing pt and readjusting restraints. Skin tears cleansed and foams placed. Safety Zone completed.

## 2019-01-27 NOTE — Progress Notes (Signed)
Pharmacy Antibiotic Note  CEBERT DETTMANN is a 85 y.o. male admitted on 04/27/9389 with alcoholic pancreatitis and concern for aspiration pneumonia.  Pharmacy has been consulted for Unasyn dosing. SCr 1.04, CrCl ~ 53 ml/min  Plan: Unasyn 3g IV every 6 hours Monitor renal function, clinical progression and LOT  Height: 6' (182.9 cm) Weight: 176 lb 12.9 oz (80.2 kg) IBW/kg (Calculated) : 77.6  Temp (24hrs), Avg:97.9 F (36.6 C), Min:97.7 F (36.5 C), Max:98 F (36.7 C)  Recent Labs  Lab 01/23/19 1717 01/23/19 2007 01/23/19 2125 01/24/19 0023 01/24/19 0314 01/25/19 0857 01/26/19 0715 01/27/19 0143  WBC 16.3*  --   --   --  14.6* 12.6* 12.0* 12.8*  CREATININE 1.12  --   --   --  1.13 1.04  --   --   LATICACIDVEN  --  5.3* 4.9* 2.6*  --   --   --   --     Estimated Creatinine Clearance: 52.9 mL/min (by C-G formula based on SCr of 1.04 mg/dL).    No Known Allergies  Antimicrobials this admission: Unasyn 4/7>> Azithro 4/3>>4/7 Ceftriaxone 4/3>>4/7  Dose adjustments this admission: n/a  Microbiology results: 4/3 BCx: ngtd 4/3 COVID19: negative MRSA PCR negative  Bertis Ruddy, PharmD Clinical Pharmacist Please check AMION for all [place] numbers 01/27/2019 8:50 AM

## 2019-01-28 LAB — CBC
HCT: 46.6 % (ref 39.0–52.0)
Hemoglobin: 15.3 g/dL (ref 13.0–17.0)
MCH: 31.2 pg (ref 26.0–34.0)
MCHC: 32.8 g/dL (ref 30.0–36.0)
MCV: 94.9 fL (ref 80.0–100.0)
Platelets: 163 10*3/uL (ref 150–400)
RBC: 4.91 MIL/uL (ref 4.22–5.81)
RDW: 13.1 % (ref 11.5–15.5)
WBC: 13 10*3/uL — ABNORMAL HIGH (ref 4.0–10.5)
nRBC: 0 % (ref 0.0–0.2)

## 2019-01-28 LAB — COMPREHENSIVE METABOLIC PANEL
ALT: 17 U/L (ref 0–44)
AST: 37 U/L (ref 15–41)
Albumin: 2.6 g/dL — ABNORMAL LOW (ref 3.5–5.0)
Alkaline Phosphatase: 92 U/L (ref 38–126)
Anion gap: 7 (ref 5–15)
BUN: 16 mg/dL (ref 8–23)
CO2: 22 mmol/L (ref 22–32)
Calcium: 8.5 mg/dL — ABNORMAL LOW (ref 8.9–10.3)
Chloride: 105 mmol/L (ref 98–111)
Creatinine, Ser: 0.95 mg/dL (ref 0.61–1.24)
GFR calc Af Amer: >60 mL/min (ref >60)
GFR calc non Af Amer: >60 mL/min (ref >60)
Glucose, Bld: 103 mg/dL — ABNORMAL HIGH (ref 70–99)
Potassium: 4 mmol/L (ref 3.5–5.1)
Sodium: 134 mmol/L — ABNORMAL LOW (ref 135–145)
Total Bilirubin: 1 mg/dL (ref 0.3–1.2)
Total Protein: 5.4 g/dL — ABNORMAL LOW (ref 6.5–8.1)

## 2019-01-28 LAB — CULTURE, BLOOD (ROUTINE X 2)
Culture: NO GROWTH
Culture: NO GROWTH

## 2019-01-28 LAB — MAGNESIUM: Magnesium: 1.7 mg/dL (ref 1.7–2.4)

## 2019-01-28 MED ORDER — ALBUTEROL SULFATE HFA 108 (90 BASE) MCG/ACT IN AERS
2.0000 | INHALATION_SPRAY | RESPIRATORY_TRACT | Status: DC
  Administered 2019-01-28 – 2019-01-29 (×7): 2 via RESPIRATORY_TRACT

## 2019-01-28 MED ORDER — FAMOTIDINE 20 MG PO TABS
20.0000 mg | ORAL_TABLET | Freq: Two times a day (BID) | ORAL | Status: DC
  Administered 2019-01-28 – 2019-02-01 (×8): 20 mg via ORAL
  Filled 2019-01-28 (×8): qty 1

## 2019-01-28 MED ORDER — ALBUTEROL SULFATE (2.5 MG/3ML) 0.083% IN NEBU
2.5000 mg | INHALATION_SOLUTION | RESPIRATORY_TRACT | Status: DC | PRN
  Administered 2019-01-28: 2.5 mg via RESPIRATORY_TRACT
  Filled 2019-01-28: qty 3

## 2019-01-28 MED ORDER — LACTATED RINGERS IV SOLN
INTRAVENOUS | Status: DC
  Administered 2019-01-28: 13:00:00 via INTRAVENOUS

## 2019-01-28 MED ORDER — LORAZEPAM 1 MG PO TABS
0.0000 mg | ORAL_TABLET | Freq: Two times a day (BID) | ORAL | Status: AC
  Administered 2019-01-29 – 2019-01-30 (×2): 1 mg via ORAL
  Filled 2019-01-28 (×2): qty 1

## 2019-01-28 MED ORDER — CHLORDIAZEPOXIDE HCL 5 MG PO CAPS
10.0000 mg | ORAL_CAPSULE | Freq: Three times a day (TID) | ORAL | Status: DC
  Administered 2019-01-28 (×2): 10 mg via ORAL
  Filled 2019-01-28 (×2): qty 2

## 2019-01-28 NOTE — Plan of Care (Signed)
  Problem: Education: Goal: Knowledge of General Education information will improve Description Including pain rating scale, medication(s)/side effects and non-pharmacologic comfort measures Outcome: Progressing   Problem: Health Behavior/Discharge Planning: Goal: Ability to manage health-related needs will improve Outcome: Progressing   Problem: Clinical Measurements: Goal: Ability to maintain clinical measurements within normal limits will improve Outcome: Progressing   Problem: Clinical Measurements: Goal: Will remain free from infection Outcome: Progressing   Problem: Clinical Measurements: Goal: Respiratory complications will improve Outcome: Progressing   Problem: Clinical Measurements: Goal: Diagnostic test results will improve Outcome: Progressing   Problem: Clinical Measurements: Goal: Cardiovascular complication will be avoided Outcome: Progressing

## 2019-01-28 NOTE — Progress Notes (Signed)
PROGRESS NOTE  Ryan Tyler  FBP:[id number] DOB: 1927-12-18 DOA: 01/23/2019 PCP: Josetta Huddle, MD  Brief Narrative: Ryan Tyler is an 85 y.o. male with a history of HTN, HLD, GERD, stage III CKD, HOH but refuses hearing aids, BPH s/p TURB, bladder CA s/p TRRBT-[place] and intravesical chemotherapy 2015 as well as laparoscopic cholecystectomy 2016 and alcohol use who presented to the ED 4/3 with 2 days of abdominal pain, nausea and poor oral intake due to acute pancreatitis seen on CT abd/pelvis with elevated lipase, presumably due to alcohol. He developed fever, dyspnea and wheezing with CXR demonstrating RLL infiltrate. Covid testing was sent with empiric precautions, antibiotics given with improvement in respiratory status. Covid is negative. He has demonstrated delirium concerning for alcohol withdrawal which is being treated medically.   Assessment & Plan: Principal Problem:   Acute pancreatitis Active Problems:   Hypertension   Metabolic acidemia   CKD (chronic kidney disease), stage III (HCC)   Leukocytosis   SIRS (systemic inflammatory response syndrome) (HCC)   Lobar pneumonia (HCC)   Sepsis (HCC)  Acute alcoholic pancreatitis: Clinically improving.  - Continue supportive care, advanced diet, no analgesics required. Abd exam benign.   Alcohol dependence and withdrawal: CIWA scores stable. Still requiring telesitter/restraints prn. - Started librium, will taper - Continue CIWA protocol and vitamin supplementation.   Right lower lobe pneumonia: covid ruled out.  - Received CTX, azithromycin with persistent leukocytosis. Continue unasyn for possible aspiration.  - Cultures from admission negative.   HTN:  - On metoprolol, prn hydralazine  Stage III CKD: Stable - Avoid nephrotoxins  Lactic acidosis: Resolved.   Thrombocytopenia: Resolved. - Monitor intermittently.   s/p unwitnessed fall: As per chart review by previous MD, patient sustained a mechanical fall in his  hospital room on 4/5 night, no visible injury, confused.  He has bilateral upper extremity ecchymosis and right groin bruising, reportedly present on admission.  Likely precipitated by AMS (mental status changes predated fall).   - Monitor closely and fall precautions.  GERD:  - PPI  Acute metabolic encephalopathy: Due to alcohol withdrawal.  - Continue CIWA and monitoring. Soft restraints as needed.   Renal stone and kidney cyst:CT scan incidentaly showed nonobstructing 1 cm left renal stone. Hyperdense cyst lower pole left kidney. Indistinct 2 cm region in the upper pole of the left kidney, similar to the study of 11 months ago, indeterminate. This could possibly represent a renal mass lesion.  - Need to f/u with [facility] urology in a month as recommended post discharge.  DVT prophylaxis: Lovenox Code Status: Full Family Communication: Per RN report, pt's son is also hospitalized at this time. Disposition Plan: Uncertain, will get PT eval once able.  Consultants:   None  Procedures:   None  Antimicrobials:  Ceftriaxone, azithromycin 4/3 - 4/6  Unasyn 4/7 >>   Subjective: Wheezing, denies dyspnea. No chest pain or feelings of fever, chills. CIWA scores 6-12/past 24hrs. Telesitter present.  Objective: Vitals:   01/27/19 2141 01/28/19 0731 01/28/19 0737 01/28/19 1003  BP: (!) 172/89   (!) 163/81  Pulse: 82  69 78  Resp: 18  16   Temp: 98.6 F (37 C)   98 F (36.7 C)  TempSrc: Oral   Oral  SpO2: 94% 92% 92% 91%  Weight:      Height:        Intake/Output Summary (Last 24 hours) at 01/28/2019 1053 Last data filed at 01/28/2019 0943 Gross per 24 hour  Intake 985.06 ml  Output 950 ml  Net 35.06 ml   Filed Weights   01/23/19 1819 01/24/19 0650  Weight: 83 kg 80.2 kg    Gen: Elderly male in no distress Pulm: Non-labored breathing with diffuse bilateral wheezing.  CV: Regular rate and rhythm. No murmur, rub, or gallop. No JVD, no pedal edema. GI: Abdomen soft, not  tender, non-distended, with normoactive bowel sounds. No organomegaly or masses felt. Ext: Warm, no deformities. Soft restraints bilateral wrists. Skin: Scattered ecchymoses Neuro: Alert, irritable. No focal neurological deficits. Psych: Judgement and insight appear impaired.  Data Reviewed: I have personally reviewed following labs and imaging studies  CBC: Recent Labs  Lab 01/24/19 0314 01/25/19 0857 01/26/19 0715 01/27/19 0143 01/28/19 0218  WBC 14.6* 12.6* 12.0* 12.8* 13.0*  HGB 15.8 15.3 14.5 14.9 15.3  HCT 48.0 47.0 46.8 45.8 46.6  MCV 96.4 96.3 97.7 95.6 94.9  PLT 152 113* 129* 152 570   Basic Metabolic Panel: Recent Labs  Lab 01/23/19 1717 01/23/19 2015 01/24/19 0314 01/25/19 0857 01/28/19 0218  NA 134* 135 138 137 134*  K 4.3 4.3 4.9 4.0 4.0  CL 101  --  106 107 105  CO2 17*  --  21* 24 22  GLUCOSE 179*  --  111* 111* 103*  BUN 17  --  17 16 16   CREATININE 1.12  --  1.13 1.04 0.95  CALCIUM 9.1  --  8.3* 8.1* 8.5*  MG  --   --   --   --  1.7   GFR: Estimated Creatinine Clearance: 57.9 mL/min (by C-G formula based on SCr of 0.95 mg/dL). Liver Function Tests: Recent Labs  Lab 01/23/19 1717 01/25/19 0857 01/28/19 0218  AST 29 17 37  ALT 17 QUANTITY NOT SUFFICIENT, UNABLE TO PERFORM TEST 17  ALKPHOS 141* 89 92  BILITOT 1.1 QUANTITY NOT SUFFICIENT, UNABLE TO PERFORM TEST 1.0  PROT 6.4* 4.8* 5.4*  ALBUMIN 3.8 2.6* 2.6*   Recent Labs  Lab 01/23/19 1717 01/25/19 0857  LIPASE 894* 44   No results for input(s): AMMONIA in the last 168 hours. Coagulation Profile: No results for input(s): INR, PROTIME in the last 168 hours. Cardiac Enzymes: No results for input(s): CKTOTAL, CKMB, CKMBINDEX, TROPONINI in the last 168 hours. BNP (last 3 results) No results for input(s): PROBNP in the last 8760 hours. HbA1C: No results for input(s): HGBA1C in the last 72 hours. CBG: No results for input(s): GLUCAP in the last 168 hours. Lipid Profile: No results for  input(s): CHOL, HDL, LDLCALC, TRIG, CHOLHDL, LDLDIRECT in the last 72 hours. Thyroid Function Tests: No results for input(s): TSH, T4TOTAL, FREET4, T3FREE, THYROIDAB in the last 72 hours. Anemia Panel: No results for input(s): VITAMINB12, FOLATE, FERRITIN, TIBC, IRON, RETICCTPCT in the last 72 hours. Urine analysis:    Component Value Date/Time   COLORURINE STRAW (A) 01/23/2019 2012   APPEARANCEUR CLEAR 01/23/2019 2012   LABSPEC >1.046 (H) 01/23/2019 2012   PHURINE 5.0 01/23/2019 2012   GLUCOSEU NEGATIVE 01/23/2019 2012   HGBUR SMALL (A) 01/23/2019 2012   BILIRUBINUR NEGATIVE 01/23/2019 2012   KETONESUR 5 (A) 01/23/2019 2012   PROTEINUR NEGATIVE 01/23/2019 2012   UROBILINOGEN 1.0 10/20/2012 0016   NITRITE NEGATIVE 01/23/2019 2012   LEUKOCYTESUR NEGATIVE 01/23/2019 2012   Recent Results (from the past 240 hour(s))  Culture, blood (x 2)     Status: None   Collection Time: 01/23/19  9:10 PM  Result Value Ref Range Status   Specimen Description BLOOD RIGHT ANTECUBITAL  Final   Special Requests   Final    BOTTLES DRAWN AEROBIC AND ANAEROBIC Blood Culture results may not be optimal due to an inadequate volume of blood received in culture bottles   Culture   Final    NO GROWTH 5 DAYS Performed at [facility], [place] [address], [place], [place] [address]    Report Status 01/28/2019 FINAL  Final  Culture, blood (x 2)     Status: None   Collection Time: 01/23/19  9:26 PM  Result Value Ref Range Status   Specimen Description BLOOD BLOOD RIGHT FOREARM  Final   Special Requests   Final    BOTTLES DRAWN AEROBIC AND ANAEROBIC Blood Culture results may not be optimal due to an inadequate volume of blood received in culture bottles   Culture   Final    NO GROWTH 5 DAYS Performed at [facility], [place] [address], [place], [place] [address]    Report Status 01/28/2019 FINAL  Final  Novel Coronavirus, NAA (hospital order; send-out to ref lab)     Status: None   Collection Time:  01/23/19 10:55 PM  Result Value Ref Range Status   SARS-CoV-2, NAA NOT DETECTED NOT DETECTED Final    Comment: Performed at [place]  Final    Comment: Performed at [facility], [place] [address] [place], [place] [address]  MRSA PCR Screening     Status: None   Collection Time: 01/24/19  6:57 AM  Result Value Ref Range Status   MRSA by PCR NEGATIVE NEGATIVE Final    Comment:        The GeneXpert MRSA Assay (FDA approved for NASAL specimens only), is one component of a comprehensive MRSA colonization surveillance program. It is not intended to diagnose MRSA infection nor to guide or monitor treatment for MRSA infections. Performed at [facility], [place] [address], [place], [place] [address]       Radiology Studies: No results found.  Scheduled Meds: . albuterol  2 puff Inhalation Q4H  . chlordiazePOXIDE  15 mg Oral TID  . cholecalciferol  1,000 Units Oral Once per day on Mon Thu  . enoxaparin (LOVENOX) injection  40 mg Subcutaneous Q24H  . famotidine  20 mg Oral BID  . folic acid  1 mg Oral Daily  . ipratropium  2 puff Inhalation Q4H  . metoprolol tartrate  50 mg Oral BID  . multivitamin with minerals  1 tablet Oral Daily  . thiamine  100 mg Oral Daily   Or  . thiamine  100 mg Intravenous Daily   Continuous Infusions: . ampicillin-sulbactam (UNASYN) IV 3 g (01/27/19 2143)     LOS: 4 days   Time spent: 25 minutes.  Patrecia Pour, MD [facility] [email or web address] Password [facility] 01/28/2019, 10:53 AM

## 2019-01-28 NOTE — Plan of Care (Signed)

## 2019-01-29 ENCOUNTER — Other Ambulatory Visit: Payer: Self-pay | Admitting: Family Medicine

## 2019-01-29 DIAGNOSIS — A419 Sepsis, unspecified organism: Principal | ICD-10-CM

## 2019-01-29 DIAGNOSIS — F10231 Alcohol dependence with withdrawal delirium: Secondary | ICD-10-CM

## 2019-01-29 LAB — COMPREHENSIVE METABOLIC PANEL
ALT: 20 U/L (ref 0–44)
AST: 32 U/L (ref 15–41)
Albumin: 2.5 g/dL — ABNORMAL LOW (ref 3.5–5.0)
Alkaline Phosphatase: 84 U/L (ref 38–126)
Anion gap: 7 (ref 5–15)
BUN: 15 mg/dL (ref 8–23)
CO2: 30 mmol/L (ref 22–32)
Calcium: 8.9 mg/dL (ref 8.9–10.3)
Chloride: 103 mmol/L (ref 98–111)
Creatinine, Ser: 0.95 mg/dL (ref 0.61–1.24)
GFR calc Af Amer: >60 mL/min (ref >60)
GFR calc non Af Amer: >60 mL/min (ref >60)
Glucose, Bld: 118 mg/dL — ABNORMAL HIGH (ref 70–99)
Potassium: 4.2 mmol/L (ref 3.5–5.1)
Sodium: 140 mmol/L (ref 135–145)
Total Bilirubin: 0.8 mg/dL (ref 0.3–1.2)
Total Protein: 5.2 g/dL — ABNORMAL LOW (ref 6.5–8.1)

## 2019-01-29 MED ORDER — IPRATROPIUM BROMIDE 0.02 % IN SOLN: 2.5000 mL | Freq: Four times a day (QID) | RESPIRATORY_TRACT | Status: DC

## 2019-01-29 MED ORDER — IPRATROPIUM BROMIDE HFA 17 MCG/ACT IN AERS
2.0000 | INHALATION_SPRAY | Freq: Three times a day (TID) | RESPIRATORY_TRACT | Status: DC
  Administered 2019-01-30: 2 via RESPIRATORY_TRACT
  Filled 2019-01-29: qty 12.9

## 2019-01-29 MED ORDER — AMOXICILLIN-POT CLAVULANATE 875-125 MG PO TABS
1.0000 | ORAL_TABLET | Freq: Two times a day (BID) | ORAL | Status: AC
  Administered 2019-01-29: 1 via ORAL
  Filled 2019-01-29: qty 1

## 2019-01-29 MED ORDER — ALBUTEROL SULFATE HFA 108 (90 BASE) MCG/ACT IN AERS
2.0000 | INHALATION_SPRAY | Freq: Three times a day (TID) | RESPIRATORY_TRACT | Status: DC
  Administered 2019-01-30: 2 via RESPIRATORY_TRACT
  Filled 2019-01-29: qty 6.7

## 2019-01-29 MED ORDER — CHLORDIAZEPOXIDE HCL 5 MG PO CAPS
5.0000 mg | ORAL_CAPSULE | Freq: Three times a day (TID) | ORAL | Status: DC
  Administered 2019-01-29 – 2019-01-30 (×3): 5 mg via ORAL
  Filled 2019-01-29 (×3): qty 1

## 2019-01-29 MED ORDER — HALOPERIDOL LACTATE 5 MG/ML IJ SOLN
1.0000 mg | Freq: Four times a day (QID) | INTRAMUSCULAR | Status: DC | PRN
  Administered 2019-01-31: 2 mg via INTRAMUSCULAR
  Filled 2019-01-29 (×2): qty 1

## 2019-01-29 MED ORDER — ALBUTEROL SULFATE (2.5 MG/3ML) 0.083% IN NEBU: 3.0000 mL | INHALATION_SOLUTION | Freq: Four times a day (QID) | RESPIRATORY_TRACT | Status: DC

## 2019-01-29 NOTE — Care Management Important Message (Signed)
Important Message  Patient Details  Name: Ryan Tyler MRN: [id number] Date of Birth: Jul 11, 1928   Medicare Important Message Given:  Yes    [name] 01/29/2019, 4:00 PM

## 2019-01-29 NOTE — Progress Notes (Signed)
Patient remove iv  Line , patient didn't have new line put in due to agitation. Notified MD and oncoming nurse on patient not having an iv. 0600 ivbb not giving and forward to the oncoming nurse.

## 2019-01-29 NOTE — Progress Notes (Addendum)
Patient ID: Ryan Tyler, male   DOB: Oct 11, 1928, 85 y.o.   MRN: [id number]  PROGRESS NOTE    Ryan Tyler  SPQ:[id number] DOB: 1928-09-21 DOA: 01/23/2019 PCP: Josetta Huddle, MD   Brief Narrative:  85 year old male with history of hypertension, hyperlipidemia, GERD, stage III CKD, hard of hearing but refuses hearing aids, BPH, bladder cancer status post TURBT and intravesical chemotherapy in 2015, laparoscopic cholecystectomy in 2016, alcohol use presented on 01/23/2019 with abdominal pain, nausea and poor oral intake.  He was found to have acute pancreatitis on CT of the abdomen and pelvis.  He developed fever, dyspnea and wheezing with chest x-ray demonstrating right lower lobe infiltrate.  COVID 19 testing was negative.  He also has been having delirium which is being treated medically.  Assessment & Plan:   Principal Problem:   Acute pancreatitis Active Problems:   Hypertension   Metabolic acidemia   CKD (chronic kidney disease), stage III (HCC)   Leukocytosis   SIRS (systemic inflammatory response syndrome) (HCC)   Lobar pneumonia (HCC)   Sepsis (HCC)  Acute alcoholic pancreatitis -Clinically improving.  Tolerating diet.  Abdominal exam is benign  Alcohol dependence and withdrawal/acute metabolic encephalopathy -Still has intermittent delirium.  Soft restraints as needed.  Continue Librium taper.  Still requiring as needed Ativan.  Continue CIWA protocol with multivitamin, thiamine and folate.  Right lower lobe pneumonia -Respiratory status stable.  Cultures negative so far -Initially was treated with Rocephin and Zithromax, currently on Unasyn.  Stop Unasyn after today's doses. -Had low-grade temperature overnight. -COVID-19 testing was negative and hence ruled out.  Sepsis: Present on admission -Resolved.  Hemodynamically stable  Hypertension--monitor blood pressure.  Continue metoprolol.  Thrombocytopenia--resolved  Chronic kidney disease stage III -Stable  Renal  stone and kidney cyst -CT scan incidentaly showed nonobstructing 1 cm left renal stone. Hyperdense cyst lower pole left kidney. Indistinct 2 cm region in the upper pole of the left kidney, similar to the study of 11 months ago, indeterminate. This could possibly represent a renal mass lesion.  - Needs to f/u with [facility] urology in a month as recommended post discharge.  Generalized deconditioning -We will get PT/OT eval.  Palliative care also will be consulted for goals of care.    DVT prophylaxis: Lovenox Code Status: Full Family Communication: None at bedside.  Family patient's son is also hospitalized at this time Disposition Plan: Depends on general outcome  Consultants: No  Procedures: None  Antimicrobials:   Ceftriaxone, azithromycin 4/3 - 4/6  Unasyn 4/7 >>   Subjective: Patient seen and examined at bedside.  He is awake, confused.  Very poor historian.  Had overnight fever.  No vomiting or diarrhea reported.  Objective: Vitals:   01/28/19 2109 01/29/19 0146 01/29/19 0455 01/29/19 0802  BP: (!) 145/61  (!) 162/96   Pulse: 73  77   Resp: 15     Temp: (!) 100.5 F (38.1 C)     TempSrc: Axillary     SpO2: 94% 96% 96% 92%  Weight:      Height:        Intake/Output Summary (Last 24 hours) at 01/29/2019 0923 Last data filed at 01/29/2019 0900 Gross per 24 hour  Intake 2068.54 ml  Output 1000 ml  Net 1068.54 ml   Filed Weights   01/23/19 1819 01/24/19 0650  Weight: 83 kg 80.2 kg    Examination:  General exam: Appears calm and comfortable.  Elderly male lying in bed, awake but hardly answers questions.  Looks confused. Respiratory system: Bilateral decreased breath sounds at bases, some scattered crackles Cardiovascular system: S1 & S2 heard, Rate controlled Gastrointestinal system: Abdomen is nondistended, soft and nontender. Normal bowel sounds heard. Extremities: No cyanosis, clubbing; trace edema   Data Reviewed: I have personally reviewed following labs  and imaging studies  CBC: Recent Labs  Lab 01/24/19 0314 01/25/19 0857 01/26/19 0715 01/27/19 0143 01/28/19 0218  WBC 14.6* 12.6* 12.0* 12.8* 13.0*  HGB 15.8 15.3 14.5 14.9 15.3  HCT 48.0 47.0 46.8 45.8 46.6  MCV 96.4 96.3 97.7 95.6 94.9  PLT 152 113* 129* 152 403   Basic Metabolic Panel: Recent Labs  Lab 01/23/19 1717 01/23/19 2015 01/24/19 0314 01/25/19 0857 01/28/19 0218 01/29/19 0236  NA 134* 135 138 137 134* 140  K 4.3 4.3 4.9 4.0 4.0 4.2  CL 101  --  106 107 105 103  CO2 17*  --  21* 24 22 30   GLUCOSE 179*  --  111* 111* 103* 118*  BUN 17  --  17 16 16 15   CREATININE 1.12  --  1.13 1.04 0.95 0.95  CALCIUM 9.1  --  8.3* 8.1* 8.5* 8.9  MG  --   --   --   --  1.7  --    GFR: Estimated Creatinine Clearance: 57.9 mL/min (by C-G formula based on SCr of 0.95 mg/dL). Liver Function Tests: Recent Labs  Lab 01/23/19 1717 01/25/19 0857 01/28/19 0218 01/29/19 0236  AST 29 17 37 32  ALT 17 QUANTITY NOT SUFFICIENT, UNABLE TO PERFORM TEST 17 20  ALKPHOS 141* 89 92 84  BILITOT 1.1 QUANTITY NOT SUFFICIENT, UNABLE TO PERFORM TEST 1.0 0.8  PROT 6.4* 4.8* 5.4* 5.2*  ALBUMIN 3.8 2.6* 2.6* 2.5*   Recent Labs  Lab 01/23/19 1717 01/25/19 0857  LIPASE 894* 44   No results for input(s): AMMONIA in the last 168 hours. Coagulation Profile: No results for input(s): INR, PROTIME in the last 168 hours. Cardiac Enzymes: No results for input(s): CKTOTAL, CKMB, CKMBINDEX, TROPONINI in the last 168 hours. BNP (last 3 results) No results for input(s): PROBNP in the last 8760 hours. HbA1C: No results for input(s): HGBA1C in the last 72 hours. CBG: No results for input(s): GLUCAP in the last 168 hours. Lipid Profile: No results for input(s): CHOL, HDL, LDLCALC, TRIG, CHOLHDL, LDLDIRECT in the last 72 hours. Thyroid Function Tests: No results for input(s): TSH, T4TOTAL, FREET4, T3FREE, THYROIDAB in the last 72 hours. Anemia Panel: No results for input(s): VITAMINB12, FOLATE,  FERRITIN, TIBC, IRON, RETICCTPCT in the last 72 hours. Sepsis Labs: Recent Labs  Lab 01/23/19 2007 01/23/19 2125 01/23/19 2126 01/24/19 0023  PROCALCITON  --   --  <0.10  --   LATICACIDVEN 5.3* 4.9*  --  2.6*    Recent Results (from the past 240 hour(s))  Culture, blood (x 2)     Status: None   Collection Time: 01/23/19  9:10 PM  Result Value Ref Range Status   Specimen Description BLOOD RIGHT ANTECUBITAL  Final   Special Requests   Final    BOTTLES DRAWN AEROBIC AND ANAEROBIC Blood Culture results may not be optimal due to an inadequate volume of blood received in culture bottles   Culture   Final    NO GROWTH 5 DAYS Performed at [place] [address] [place], [place] [address]    Report Status 01/28/2019 FINAL  Final  Culture, blood (x 2)     Status: None  Collection Time: 01/23/19  9:26 PM  Result Value Ref Range Status   Specimen Description BLOOD BLOOD RIGHT FOREARM  Final   Special Requests   Final    BOTTLES DRAWN AEROBIC AND ANAEROBIC Blood Culture results may not be optimal due to an inadequate volume of blood received in culture bottles   Culture   Final    NO GROWTH 5 DAYS Performed at [facility], [place] [address] [place], [place] [address]    Report Status 01/28/2019 FINAL  Final  Novel Coronavirus, NAA (hospital order; send-out to ref lab)     Status: None   Collection Time: 01/23/19 10:55 PM  Result Value Ref Range Status   SARS-CoV-2, NAA NOT DETECTED NOT DETECTED Final    Comment: Performed at [place]  Final    Comment: Performed at [facility], [place] [address], [place], [place] [address]  MRSA PCR Screening     Status: None   Collection Time: 01/24/19  6:57 AM  Result Value Ref Range Status   MRSA by PCR NEGATIVE NEGATIVE Final    Comment:        The GeneXpert MRSA Assay (FDA approved for NASAL specimens only), is one component of a comprehensive MRSA colonization surveillance  program. It is not intended to diagnose MRSA infection nor to guide or monitor treatment for MRSA infections. Performed at [facility], [place] [address], [place],  [address]          Radiology Studies: No results found.      Scheduled Meds: . albuterol  2 puff Inhalation Q4H  . chlordiazePOXIDE  10 mg Oral TID  . cholecalciferol  1,000 Units Oral Once per day on Mon Thu  . enoxaparin (LOVENOX) injection  40 mg Subcutaneous Q24H  . famotidine  20 mg Oral BID  . folic acid  1 mg Oral Daily  . ipratropium  2 puff Inhalation Q4H  . LORazepam  0-4 mg Oral Q12H  . metoprolol tartrate  50 mg Oral BID  . multivitamin with minerals  1 tablet Oral Daily  . thiamine  100 mg Oral Daily   Or  . thiamine  100 mg Intravenous Daily   Continuous Infusions: . ampicillin-sulbactam (UNASYN) IV 3 g (01/29/19 0000)  . lactated ringers Stopped (01/29/19 0416)     LOS: 5 days        Aline August, MD [facility] 01/29/2019, 9:23 AM

## 2019-01-29 NOTE — Evaluation (Signed)
Clinical/Bedside Swallow Evaluation Patient Details  Name: TYRAN HUSER MRN: [id number] Date of Birth: 06-Dec-1927  Today's Date: 01/29/2019 Time: SLP Start Time (ACUTE ONLY): 51 SLP Stop Time (ACUTE ONLY): 0932 SLP Time Calculation (min) (ACUTE ONLY): 12 min  Past Medical History:  Past Medical History:  Diagnosis Date  . Asymptomatic cholelithiasis   . Bladder tumor   . BPH (benign prostatic hypertrophy)   . Cancer ([place])    bladder  . GERD (gastroesophageal reflux disease)   . Hearing loss   . HOH (hard of hearing)    no hearing aids  . Hyperlipidemia   . Hypertension   . Kidney stones   . Mild left inguinal hernia   . Nocturia   . Urgency of urination    Past Surgical History:  Past Surgical History:  Procedure Laterality Date  . CATARACT EXTRACTION W/ INTRAOCULAR LENS IMPLANT Left nov  2014  . CHOLECYSTECTOMY N/A 08/16/2015   Procedure: LAPAROSCOPIC CHOLECYSTECTOMY;  Surgeon: Rolm Bookbinder, MD;  Location: [place];  Service: General;  Laterality: N/A;  . TONSILLECTOMY    . TRANSURETHRAL RESECTION OF BLADDER TUMOR WITH GYRUS (TURBT-GYRUS) N/A 04/05/2014   Procedure: TRANSURETHRAL RESECTION OF BLADDER TUMOR WITH GYRUS  AND INTRAVESICO CHEMO;  Surgeon: Claybon Jabs, MD;  Location: [facility];  Service: Urology;  Laterality: N/A;  . TRANSURETHRAL RESECTION OF PROSTATE  19951   HPI:  85 year old male with history of hypertension, hyperlipidemia, GERD, stage III CKD, hard of hearing but refuses hearing aids, BPH, bladder cancer status post TURBT and intravesical chemotherapy in 2015, laparoscopic cholecystectomy in 2016, alcohol use presented on 01/23/2019 with abdominal pain, nausea and poor oral intake.  He was found to have acute pancreatitis on CT of the abdomen and pelvis.  He developed fever, dyspnea and wheezing with chest x-ray demonstrating right lower lobe infiltrate.  Chowbey testing was negative.  He also has been having delirium which is being treated  medically.  COVID testing sent and returned negative   Assessment / Plan / Recommendation Clinical Impression  Pt did not participate in PO trials this date.  Pt was uncooperative and became increasingly agitated/combative during attempts to get pt to participate including swatting at bolus presentation and swearing at therapist.  Pt attempted to get out of bed.  RN came in to room  and assisted with calming pt.  Swallowing evaluation deferred at this time.  Will reattempt as able.  Pt may benefit from cognitive evaluation if AMS does not resolve.  RN reports good tolerance of POs, but report of coughing with intake received overnight.  Will defer diet to team. SLP Visit Diagnosis: Dysphagia, unspecified (R13.10)    Aspiration Risk       Diet Recommendation (Defer diet to team.)   Supervision: Full supervision/cueing for compensatory strategies Compensations: Slow rate;Small sips/bites;Minimize environmental distractions Postural Changes: Seated upright at 90 degrees    Other  Recommendations Oral Care Recommendations: Oral care BID   Follow up Recommendations        Frequency and Duration min 1 x/week  1 week       Prognosis Prognosis for Safe Diet Advancement: Fair Barriers to Reach Goals: Cognitive deficits      Swallow Study   General HPI: 85 year old male with history of hypertension, hyperlipidemia, GERD, stage III CKD, hard of hearing but refuses hearing aids, BPH, bladder cancer status post TURBT and intravesical chemotherapy in 2015, laparoscopic cholecystectomy in 2016, alcohol use presented on 01/23/2019 with abdominal pain, nausea  and poor oral intake.  He was found to have acute pancreatitis on CT of the abdomen and pelvis.  He developed fever, dyspnea and wheezing with chest x-ray demonstrating right lower lobe infiltrate.  Chowbey testing was negative.  He also has been having delirium which is being treated medically.  COVID testing sent and returned negative Type of  Study: Bedside Swallow Evaluation Diet Prior to this Study: Regular Temperature Spikes Noted: No History of Recent Intubation: No Behavior/Cognition: Alert;Agitated;Uncooperative;Doesn't follow directions;Impulsive Oral Cavity - Dentition: Adequate natural dentition(seemingly ) Self-Feeding Abilities: Refused PO Patient Positioning: Upright in bed Baseline Vocal Quality: Normal Volitional Cough: Cognitively unable to elicit Volitional Swallow: Unable to elicit    Oral/Motor/Sensory Function Overall Oral Motor/Sensory Function: (Unable to assess)   Ice Chips Ice chips: Not tested   Thin Liquid Thin Liquid: Not tested    Nectar Thick Nectar Thick Liquid: Not tested   Honey Thick Honey Thick Liquid: Not tested   Puree Puree: Not tested   Solid     Solid: Not tested      Celedonio Savage, [place], [place] Office: [phone]; Pager [phone] 01/29/2019,10:44 AM

## 2019-01-29 NOTE — Progress Notes (Signed)
Patient resting this AM. SLP went to do speech eval at approx 0930. Patient became agitated. This nurse went to assess, informed SLP eva could be deferred at this time. Patient continued to be agitated. Patient states "this mother fucker comes and wakes me up talking about eat this, eat that. I don't want that shit. I'm getting up out this mother fucker. I'm leaving." Advised patient to stay in bed, re-oriented to surrounding. Patient refused. This nurse had to call for assistance.Patient assisted info chair, able to calm after some time. Ativan not given as, per night nurse, it causes further agitation

## 2019-01-29 NOTE — Plan of Care (Signed)
  Problem: Health Behavior/Discharge Planning: Goal: Ability to manage health-related needs will improve Outcome: Progressing   Problem: Education: Goal: Knowledge of General Education information will improve Description: Including pain rating scale, medication(s)/side effects and non-pharmacologic comfort measures Outcome: Progressing   Problem: Clinical Measurements: Goal: Will remain free from infection Outcome: Progressing   Problem: Clinical Measurements: Goal: Respiratory complications will improve Outcome: Progressing   

## 2019-01-29 NOTE — Progress Notes (Signed)
Palliative Note:  Referral received for goals of care discussion. Attempted to assess patient and introduce self at bedside. He would not open eyes and made grumbling sound.   Voicemail left with son.   Detailed note and recommendations to follow once able to connect with son, as patient is unable to engage in [place] discussion at this time.   Thank you for your referral.   Alda Lea, AGPCNP-BC Palliative Medicine Team  Phone: [phone] Fax: [phone] Pager: [phone] Amion: [place]

## 2019-01-29 NOTE — Evaluation (Signed)
Physical Therapy Evaluation Patient Details Name: Ryan Tyler MRN: [id number] DOB: 1928-05-09 Today's Date: 01/29/2019   History of Present Illness  Pt is an 85 y/o male admitted secondary to alcoholic pancreatitis. Pt also with alcohol withdrawals and AMS. Imaging revelaed RLL PNA. COVID testing negative. PMH includes HTN, alcohol abuse, GERD, CKD, and bladder cancer.   Clinical Impression  Pt admitted secondary to problem above with deficits below. Pt requiring min guard to min A for mobility tasks this session.  Educated pt's son about need for RW at d/c and need for increased assist at home. Per pt's son, plan is for pt to return home with 24/7 assist. Will continue to follow acutely to maximize functional mobility independence and safety.     Follow Up Recommendations Home health PT;Supervision/Assistance - 24 hour    Equipment Recommendations  None recommended by PT    Recommendations for Other Services       Precautions / Restrictions Precautions Precautions: Fall Restrictions Weight Bearing Restrictions: No      Mobility  Bed Mobility Overal bed mobility: Needs Assistance Bed Mobility: Sit to Supine       Sit to supine: Supervision   General bed mobility comments: Supervision for safety to return to supine.   Transfers Overall transfer level: Needs assistance Equipment used: None Transfers: Sit to/from Stand Sit to Stand: Min assist         General transfer comment: Pt initially standing up in room with alarm sounding upon entry. Had pt returned to sitting. Upon standing on second attempt, pt requiring min A for steadying secondary to mild LOB upon standing.   Ambulation/Gait Ambulation/Gait assistance: Min guard Gait Distance (Feet): 125 Feet Assistive device: None Gait Pattern/deviations: Step-through pattern;Decreased stride length;Shuffle;Drifts right/left Gait velocity: Decreased    General Gait Details: Short very guarded steps. Mild unsteadiness  noted requiring min guard A for steadying. Pt admitted in room down the hall, and RN approved for pt to ambulate to son's room. Per son, pt's gait much different than baseline. Educated son about need for 24/7 assist and use of RW at d/c and pt's son's available.   Stairs            Wheelchair Mobility    Modified Rankin (Stroke Patients Only)       Balance Overall balance assessment: Needs assistance Sitting-balance support: No upper extremity supported;Feet supported Sitting balance-Leahy Scale: Fair     Standing balance support: No upper extremity supported;During functional activity Standing balance-Leahy Scale: Fair                               Pertinent Vitals/Pain Pain Assessment: No/denies pain    Home Living Family/patient expects to be discharged to:: Private residence Living Arrangements: Children Available Help at Discharge: Family;Available 24 hours/day Type of Home: House Home Access: Stairs to enter Entrance Stairs-Rails: Right;Left;Can reach both Entrance Stairs-Number of Steps: 5 Home Layout: One level Home Equipment: Walker - 2 wheels Additional Comments: Information from son, as pt unable to answer home information.     Prior Function Level of Independence: Independent         Comments: Per son, if pt leaves the house, son reports he is with him.      Hand Dominance        Extremity/Trunk Assessment   Upper Extremity Assessment Upper Extremity Assessment: Defer to OT evaluation    Lower Extremity Assessment Lower Extremity Assessment: Generalized  weakness    Cervical / Trunk Assessment Cervical / Trunk Assessment: Kyphotic  Communication   Communication: HOH  Cognition Arousal/Alertness: Awake/alert Behavior During Therapy: Restless Overall Cognitive Status: No family/caregiver present to determine baseline cognitive functioning                                 General Comments: Pt standing up in  room with alarms going off upon entry. Pt followed commands with increased time and was easily redirectable.       General Comments      Exercises     Assessment/Plan    PT Assessment Patient needs continued PT services  PT Problem List Decreased strength;Decreased balance;Decreased mobility;Decreased cognition;Decreased knowledge of use of DME;Decreased knowledge of precautions;Decreased safety awareness       PT Treatment Interventions DME instruction;Gait training;Stair training;Functional mobility training;Therapeutic activities;Therapeutic exercise;Balance training;Patient/family education    PT Goals (Current goals can be found in the Care Plan section)  Acute Rehab PT Goals Patient Stated Goal: for pt to return home per son PT Goal Formulation: With family Time For Goal Achievement: 02/12/19 Potential to Achieve Goals: Good    Frequency Min 3X/week   Barriers to discharge        Co-evaluation               AM-PAC PT "6 Clicks" Mobility  Outcome Measure Help needed turning from your back to your side while in a flat bed without using bedrails?: None Help needed moving from lying on your back to sitting on the side of a flat bed without using bedrails?: None Help needed moving to and from a bed to a chair (including a wheelchair)?: A Little Help needed standing up from a chair using your arms (e.g., wheelchair or bedside chair)?: A Little Help needed to walk in hospital room?: A Little Help needed climbing 3-5 steps with a railing? : A Lot 6 Click Score: 19    End of Session Equipment Utilized During Treatment: Gait belt Activity Tolerance: Patient tolerated treatment well Patient left: in bed;with call bell/phone within reach;with bed alarm set Nurse Communication: Mobility status PT Visit Diagnosis: Unsteadiness on feet (R26.81);Muscle weakness (generalized) (M62.81)    Time: 9767-3419 PT Time Calculation (min) (ACUTE ONLY): 34 min   Charges:   PT  Evaluation $PT Eval Moderate Complexity: 1 Mod PT Treatments $Gait Training: 8-22 mins        Leighton Ruff, PT, DPT  Acute Rehabilitation Services  Pager: [phone] Office: [phone]   Rudean Hitt 01/29/2019, 5:06 PM

## 2019-01-30 ENCOUNTER — Inpatient Hospital Stay (HOSPITAL_COMMUNITY): Payer: Medicare Other

## 2019-01-30 DIAGNOSIS — Z66 Do not resuscitate: Secondary | ICD-10-CM

## 2019-01-30 DIAGNOSIS — Z515 Encounter for palliative care: Secondary | ICD-10-CM

## 2019-01-30 DIAGNOSIS — Z7189 Other specified counseling: Secondary | ICD-10-CM

## 2019-01-30 LAB — CBC WITH DIFFERENTIAL/PLATELET
Abs Immature Granulocytes: 0.2 10*3/uL — ABNORMAL HIGH (ref 0.00–0.07)
Basophils Absolute: 0.1 10*3/uL (ref 0.0–0.1)
Basophils Relative: 1 %
Eosinophils Absolute: 0.4 10*3/uL (ref 0.0–0.5)
Eosinophils Relative: 3 %
HCT: 47.9 % (ref 39.0–52.0)
Hemoglobin: 15.6 g/dL (ref 13.0–17.0)
Immature Granulocytes: 1 %
Lymphocytes Relative: 8 %
Lymphs Abs: 1.1 10*3/uL (ref 0.7–4.0)
MCH: 31.2 pg (ref 26.0–34.0)
MCHC: 32.6 g/dL (ref 30.0–36.0)
MCV: 95.8 fL (ref 80.0–100.0)
Monocytes Absolute: 1.9 10*3/uL — ABNORMAL HIGH (ref 0.1–1.0)
Monocytes Relative: 13 %
Neutro Abs: 10.3 10*3/uL — ABNORMAL HIGH (ref 1.7–7.7)
Neutrophils Relative %: 74 %
Platelets: 205 10*3/uL (ref 150–400)
RBC: 5 MIL/uL (ref 4.22–5.81)
RDW: 13.2 % (ref 11.5–15.5)
WBC: 13.9 10*3/uL — ABNORMAL HIGH (ref 4.0–10.5)
nRBC: 0 % (ref 0.0–0.2)

## 2019-01-30 LAB — COMPREHENSIVE METABOLIC PANEL
ALT: 22 U/L (ref 0–44)
AST: 28 U/L (ref 15–41)
Albumin: 2.6 g/dL — ABNORMAL LOW (ref 3.5–5.0)
Alkaline Phosphatase: 87 U/L (ref 38–126)
Anion gap: 10 (ref 5–15)
BUN: 20 mg/dL (ref 8–23)
CO2: 24 mmol/L (ref 22–32)
Calcium: 8.8 mg/dL — ABNORMAL LOW (ref 8.9–10.3)
Chloride: 104 mmol/L (ref 98–111)
Creatinine, Ser: 0.97 mg/dL (ref 0.61–1.24)
GFR calc Af Amer: >60 mL/min (ref >60)
GFR calc non Af Amer: >60 mL/min (ref >60)
Glucose, Bld: 152 mg/dL — ABNORMAL HIGH (ref 70–99)
Potassium: 4.2 mmol/L (ref 3.5–5.1)
Sodium: 138 mmol/L (ref 135–145)
Total Bilirubin: 0.6 mg/dL (ref 0.3–1.2)
Total Protein: 5.6 g/dL — ABNORMAL LOW (ref 6.5–8.1)

## 2019-01-30 LAB — MAGNESIUM: Magnesium: 1.7 mg/dL (ref 1.7–2.4)

## 2019-01-30 MED ORDER — LISINOPRIL 10 MG PO TABS
10.0000 mg | ORAL_TABLET | Freq: Every day | ORAL | Status: DC
  Administered 2019-01-30 – 2019-02-01 (×3): 10 mg via ORAL
  Filled 2019-01-30 (×3): qty 1

## 2019-01-30 MED ORDER — CHLORDIAZEPOXIDE HCL 5 MG PO CAPS
5.0000 mg | ORAL_CAPSULE | Freq: Two times a day (BID) | ORAL | Status: DC
  Administered 2019-01-30 – 2019-01-31 (×2): 5 mg via ORAL
  Filled 2019-01-30 (×2): qty 1

## 2019-01-30 MED ORDER — HYDRALAZINE HCL 25 MG PO TABS
25.0000 mg | ORAL_TABLET | Freq: Three times a day (TID) | ORAL | Status: DC | PRN
  Administered 2019-01-30 – 2019-01-31 (×2): 25 mg via ORAL
  Filled 2019-01-30 (×2): qty 1

## 2019-01-30 NOTE — Evaluation (Signed)
Clinical/Bedside Swallow Evaluation Patient Details  Name: Ryan Tyler MRN: [id number] Date of Birth: Mar 05, 1928  Today's Date: 01/30/2019 Time: SLP Start Time (ACUTE ONLY): 1110 SLP Stop Time (ACUTE ONLY): 1125 SLP Time Calculation (min) (ACUTE ONLY): 15 min  Past Medical History:  Past Medical History:  Diagnosis Date  . Asymptomatic cholelithiasis   . Bladder tumor   . BPH (benign prostatic hypertrophy)   . Cancer ([place])    bladder  . GERD (gastroesophageal reflux disease)   . Hearing loss   . HOH (hard of hearing)    no hearing aids  . Hyperlipidemia   . Hypertension   . Kidney stones   . Mild left inguinal hernia   . Nocturia   . Urgency of urination    Past Surgical History:  Past Surgical History:  Procedure Laterality Date  . CATARACT EXTRACTION W/ INTRAOCULAR LENS IMPLANT Left nov  2014  . CHOLECYSTECTOMY N/A 08/16/2015   Procedure: LAPAROSCOPIC CHOLECYSTECTOMY;  Surgeon: Rolm Bookbinder, MD;  Location: [place];  Service: General;  Laterality: N/A;  . TONSILLECTOMY    . TRANSURETHRAL RESECTION OF BLADDER TUMOR WITH GYRUS (TURBT-GYRUS) N/A 04/05/2014   Procedure: TRANSURETHRAL RESECTION OF BLADDER TUMOR WITH GYRUS  AND INTRAVESICO CHEMO;  Surgeon: Claybon Jabs, MD;  Location: [facility];  Service: Urology;  Laterality: N/A;  . TRANSURETHRAL RESECTION OF PROSTATE  19958   HPI:  85 year old male with history of hypertension, hyperlipidemia, GERD, stage III CKD, hard of hearing but refuses hearing aids, BPH, bladder cancer status post TURBT and intravesical chemotherapy in 2015, laparoscopic cholecystectomy in 2016, alcohol use presented on 01/23/2019 with abdominal pain, nausea and poor oral intake.  He was found to have acute pancreatitis on CT of the abdomen and pelvis.  He developed fever, dyspnea and wheezing with chest x-ray demonstrating right lower lobe infiltrate.  Chowbey testing was negative.  He also has been having delirium which is being treated  medically.  COVID testing sent and returned negative   Assessment / Plan / Recommendation Clinical Impression  Pt seen to re-attempt BSE. Pt was confused, but agreeable to eating lunch. Per RN, Pt had difficulty swallowing grits this morning (pocketing, oral residue). Pt demonstrated prolonged mastication and pocketing in left lateral sulci. Significant oral residue noted after swallowing and required liquid wash to clear oral cavity. Observed frequent coughing on both solids and thin liquids. After swallowing one larger bite of solids, Pt turned red in the face and demonstrated prolonged cough. Concern for aspiration due to AMS and possible mild right base pneumonia. Recommend MBS to assess oropharyngeal function. Will update treatment plan pending results of MBS.  SLP Visit Diagnosis: Dysphagia, unspecified (R13.10)    Aspiration Risk  Moderate aspiration risk    Diet Recommendation Other (Comment)(defer until MBS complete)   Supervision: Full supervision/cueing for compensatory strategies Compensations: Minimize environmental distractions;Slow rate;Small sips/bites Postural Changes: Seated upright at 90 degrees    Other  Recommendations Oral Care Recommendations: Oral care BID   Follow up Recommendations        Frequency and Duration min 2x/week  2 weeks       Prognosis Prognosis for Safe Diet Advancement: Fair Barriers to Reach Goals: Cognitive deficits      Swallow Study   General HPI: 85 year old male with history of hypertension, hyperlipidemia, GERD, stage III CKD, hard of hearing but refuses hearing aids, BPH, bladder cancer status post TURBT and intravesical chemotherapy in 2015, laparoscopic cholecystectomy in 2016, alcohol use presented on  01/23/2019 with abdominal pain, nausea and poor oral intake.  He was found to have acute pancreatitis on CT of the abdomen and pelvis.  He developed fever, dyspnea and wheezing with chest x-ray demonstrating right lower lobe infiltrate.   Chowbey testing was negative.  He also has been having delirium which is being treated medically.  COVID testing sent and returned negative Type of Study: Bedside Swallow Evaluation Diet Prior to this Study: Regular;Thin liquids Temperature Spikes Noted: No Respiratory Status: Room air History of Recent Intubation: No Behavior/Cognition: Alert;Agitated;Requires cueing;Lethargic/Drowsy;Confused Oral Cavity Assessment: Within Functional Limits Oral Care Completed by SLP: No Oral Cavity - Dentition: Adequate natural dentition Vision: Functional for self-feeding Self-Feeding Abilities: Able to feed self;Needs assist Patient Positioning: Upright in bed Baseline Vocal Quality: Low vocal intensity Volitional Swallow: Able to elicit    Oral/Motor/Sensory Function Overall Oral Motor/Sensory Function: (unable to assess)   Ice Chips Ice chips: Not tested   Thin Liquid Thin Liquid: Impaired Presentation: Straw Pharyngeal  Phase Impairments: Cough - Immediate;Decreased hyoid-laryngeal movement;Wet Vocal Quality    Nectar Thick Nectar Thick Liquid: Not tested   Honey Thick Honey Thick Liquid: Not tested   Puree Puree: Not tested   Solid     Solid: Impaired Presentation: Self Fed Oral Phase Impairments: Reduced lingual movement/coordination;Impaired mastication;Poor awareness of bolus Oral Phase Functional Implications: Left lateral sulci pocketing;Oral residue;Prolonged oral transit;Impaired mastication Pharyngeal Phase Impairments: Suspected delayed Swallow;Decreased hyoid-laryngeal movement;Cough - Immediate;Wet Vocal Quality      [name] 01/30/2019,12:30 PM  Melody Haver, M.Ed., CCC-SLP 01/30/19 12:33 PM

## 2019-01-30 NOTE — Progress Notes (Signed)
01/30/19 1341  PT Visit Information  Last PT Received On 01/30/19  Assistance Needed +1  History of Present Illness Pt is an 85 y/o male admitted secondary to alcoholic pancreatitis. Pt also with alcohol withdrawals and AMS. Imaging revelaed RLL PNA. COVID testing negative. PMH includes HTN, alcohol abuse, GERD, CKD, and bladder cancer.   Subjective Data  Patient Stated Goal for pt to return home per son  Precautions  Precautions Fall  Restrictions  Weight Bearing Restrictions No  Pain Assessment  Pain Assessment No/denies pain  Cognition  Arousal/Alertness Awake/alert  Behavior During Therapy [facility] for tasks assessed/performed  Overall Cognitive Status No family/caregiver present to determine baseline cognitive functioning  General Comments Pt very agreeable to work with therapy.   Bed Mobility  Overal bed mobility Needs Assistance  Bed Mobility Sit to Supine  Sit to supine Supervision  General bed mobility comments Supervision for safety to return to supine. Increased time required.   Transfers  Overall transfer level Needs assistance  Equipment used Rolling walker (2 wheeled)  Transfers Sit to/from Stand  Sit to Stand Min guard  General transfer comment Pt standing at sink with OT upon entry. Min guard to return to sitting following gait training.   Ambulation/Gait  Ambulation/Gait assistance Min guard  Gait Distance (Feet) 125 Feet  Assistive device Rolling walker (2 wheeled)  Gait Pattern/deviations Step-through pattern;Decreased stride length;Shuffle;Drifts right/left  General Gait Details Improved steadiness noted with use of RW. Pt initially taking very short steps, but able to increase step length with cues.   Gait velocity Decreased   Balance  Overall balance assessment Needs assistance  Sitting-balance support No upper extremity supported;Feet supported  Sitting balance-Leahy Scale Fair  Standing balance support No upper extremity supported;During functional  activity;Bilateral upper extremity supported  Standing balance-Leahy Scale Fair  Standing balance comment Able to stand at sink without UE support  Exercises  Exercises General Lower Extremity  General Exercises - Lower Extremity  Ankle Circles/Pumps AROM;Both;10 reps;Supine  Long Arc Quad AROM;Both;10 reps;Seated  Heel Slides AROM;Both;10 reps;Supine  PT - End of Session  Equipment Utilized During Treatment Gait belt  Activity Tolerance Patient tolerated treatment well  Patient left in bed;with call bell/phone within reach;with bed alarm set  Nurse Communication Mobility status   PT - Assessment/Plan  PT Plan Current plan remains appropriate  PT Visit Diagnosis Unsteadiness on feet (R26.81);Muscle weakness (generalized) (M62.81)  PT Frequency (ACUTE ONLY) Min 3X/week  Follow Up Recommendations Home health PT;Supervision/Assistance - 24 hour  PT equipment None recommended by PT  AM-PAC PT "6 Clicks" Mobility Outcome Measure (Version 2)  Help needed turning from your back to your side while in a flat bed without using bedrails? 4  Help needed moving from lying on your back to sitting on the side of a flat bed without using bedrails? 4  Help needed moving to and from a bed to a chair (including a wheelchair)? 3  Help needed standing up from a chair using your arms (e.g., wheelchair or bedside chair)? 3  Help needed to walk in hospital room? 3  Help needed climbing 3-5 steps with a railing?  2  6 Click Score 19  Consider Recommendation of Discharge To: Home with [facility]  PT Goal Progression  Progress towards PT goals Progressing toward goals  Acute Rehab PT Goals  PT Goal Formulation With family  Time For Goal Achievement 02/12/19  Potential to Achieve Goals Good  PT Time Calculation  PT Start Time (ACUTE ONLY) 1010  PT Stop Time (ACUTE ONLY) 1025  PT Time Calculation (min) (ACUTE ONLY) 15 min  PT General Charges  $$ ACUTE PT VISIT 1 Visit  PT Treatments  $Gait Training 8-22 mins    Pt progressing well towards goals. Pt with improved balance with use of RW. Required min guard A for mobility tasks using RW. Educated about LE HEP. Will continue to follow acutely to maximize functional mobility independence and safety.   Leighton Ruff, PT, DPT  Acute Rehabilitation Services  Pager: [phone] Office: [phone]

## 2019-01-30 NOTE — Consult Note (Signed)
Consultation Note Date: 01/30/2019   Patient Name: Ryan Ryan Tyler  DOB: 1927/12/27  MRN: [id number]  Age / Sex: 85 y.o., male   PCP: Ryan Huddle, MD Referring Physician: Aline August, MD   REASON FOR CONSULTATION:Establishing goals of care  Palliative Care consult requested for this 85 y.o. male with multiple medical problems including hypertension, GERD, hyperlipidemia, BPH s/p TURP, chronic kidney disease stage Ryan Tyler, bladder cancer, hard of hearing, and inguinal hernia. Patient presented with abdominal pain, nausea, and vomiting. He was found to have elevated lipase, WBC 16.3, lactic acid 5.3. Abdominal x-ray showed acute pancreatitis.   I have reviewed medical records including lab results, imaging, Epic notes, and MAR, received report from the bedside RN, and assessed the patient. I spoke with his son Ryan Ryan Tyler to discuss diagnosis prognosis, [place], EOL wishes, disposition and options. Patient continues to be confused and unable to engage in appropriate conversation.   I introduced Palliative Medicine as specialized medical care for people living with serious illness. It focuses on providing relief from the symptoms and stress of a serious illness. The goal is to improve quality of life for both the patient and the family.  We discussed a brief life review of the patient, along with Mr. Ryan Ryan Tyler functional and nutritional status. Son reports he served for many years as a [employer]ud[employer]. 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 He has 5 children however per son, he has not had any contact with any of them outside of himself for over 10-15 years. He enjoyed fishing and [employer].   Prior to admission son reports patient had a huge appetite and would often snack throughout the day. He was ambulatory without assistive devices. He was alert and oriented. Son recently began staying with patient due to being laid off because of COVID-19 and after noticing  patient had a near fall weeks prior.   We discussed  His current illness and what it means in the larger context of his on-going co-morbidities. With specific discussions regarding Mr. Ryan Ryan Tyler alcohol use, pneumonia, pancreatitis, sepsis, and overall functional state. Natural disease trajectory and expectations at EOL were discussed.  Son was tearful and began describing "family demons" and dysfunctional relationships throughout the years. Ryan Ryan Tyler reports he recently was discharged yesterday due to similar issues as patient and alcohol use. He reports he has since cleaned up the home and removed all alcohol to have a better lifestyle for not just him but his father. He reports he is hopeful his father will continue to show improvement and will return home and live a decent quality of life with what time he has left.   I attempted to elicit values and goals of care important to the patient.    The difference between aggressive medical intervention and comfort care was considered in light of the patient's goals of care. Son reports his wishes is for his father to continue to receive treatment with goal of returning home. He is hopeful he will continue to show improvement once he is back in "his own home and environment".   Son confirms DNR/DNI.  Hospice and Palliative Care services outpatient were explained and offered. Son verbalized his understanding and awareness of both hospice and palliative goals and philosophy of care. He is requesting his father return home with home health and outpatient palliative for support.   Questions and concerns were addressed. The family was encouraged to call with questions or concerns.  PMT will continue to support holistically.    SOCIAL HISTORY:     reports that he has never smoked. He quit smokeless tobacco use about 9 years ago.  His smokeless tobacco use included chew. He reports current alcohol use. He reports that he does not use drugs.  ADVANCE  DIRECTIVES:  Primary Decision Maker: Ryan Ryan Tyler HCPOA: YES    CODE STATUS: DNR  SYMPTOM MANAGEMENT: Per attending/CIWA Protocol   PSYCHO-SOCIAL/SPIRITUAL:  Support System: Son and other family members  Desire for further Chaplaincy support:NO   PAST MEDICAL HISTORY: Past Medical History:  Diagnosis Date  . Asymptomatic cholelithiasis   . Bladder tumor   . BPH (benign prostatic hypertrophy)   . Cancer ([place])    bladder  . GERD (gastroesophageal reflux disease)   . Hearing loss   . HOH (hard of hearing)    no hearing aids  . Hyperlipidemia   . Hypertension   . Kidney stones   . Mild left inguinal hernia   . Nocturia   . Urgency of urination     PAST SURGICAL HISTORY:  Past Surgical History:  Procedure Laterality Date  . CATARACT EXTRACTION W/ INTRAOCULAR LENS IMPLANT Left nov  2014  . CHOLECYSTECTOMY N/A 08/16/2015   Procedure: LAPAROSCOPIC CHOLECYSTECTOMY;  Surgeon: Rolm Bookbinder, MD;  Location: [facility];  Service: General;  Laterality: N/A;  . TONSILLECTOMY    . TRANSURETHRAL RESECTION OF BLADDER TUMOR WITH GYRUS (TURBT-GYRUS) N/A 04/05/2014   Procedure: TRANSURETHRAL RESECTION OF BLADDER TUMOR WITH GYRUS  AND INTRAVESICO CHEMO;  Surgeon: Claybon Jabs, MD;  Location[facility]  Service: Urology;  Laterality: N/A;  . TRANSURETHRAL RESECTION OF PROSTATE  1995    ALLERGIES:  has No Known Allergies.  MEDICATIONS:  Current Facility-Administered Medications  Medication Dose Route Frequency Provider Last Rate Last Dose  . acetaminophen (TYLENOL) tablet 650 mg  650 mg Oral Q6H PRN Ivor Costa, MD   650 mg at 01/29/19 2204   Or  . acetaminophen (TYLENOL) suppository 650 mg  650 mg Rectal Q6H PRN Ivor Costa, MD      . albuterol (PROVENTIL) (2.5 MG/3ML) 0.083% nebulizer solution 2.5 mg  2.5 mg Nebulization Q3H PRN Patrecia Pour, MD   2.5 mg at 01/28/19 1357  . chlordiazePOXIDE (LIBRIUM) capsule 5 mg  5 mg Oral BID Ryan August, MD   5 mg at  01/30/19 2107  . cholecalciferol (VITAMIN D3) tablet 1,000 Units  1,000 Units Oral Once per day on Mon Thu Niu, Xilin, MD      . enoxaparin (LOVENOX) injection 40 mg  40 mg Subcutaneous Q24H Ivor Costa, MD   40 mg at 01/30/19 0916  . famotidine (PEPCID) tablet 20 mg  20 mg Oral BID Patrecia Pour, MD   20 mg at 01/30/19 2107  . folic acid (FOLVITE) tablet 1 mg  1 mg Oral Daily Hongalgi, Anand D, MD   1 mg at 01/30/19 0915  . haloperidol lactate (HALDOL) injection 1-2 mg  1-2 mg Intramuscular Q6H PRN Ryan August, MD      . hydrALAZINE (APRESOLINE) tablet 25 mg  25 mg Oral Q8H PRN Ryan August, MD   25 mg at 01/30/19 1754  . lisinopril (PRINIVIL,ZESTRIL) tablet 10 mg  10 mg Oral Daily Ryan August, MD   10 mg at 01/30/19 1754  . metoprolol tartrate (LOPRESSOR) tablet 50 mg  50 mg Oral BID Thurnell Lose, MD   50 mg at 01/30/19 2107  . multivitamin with  minerals tablet 1 tablet  1 tablet Oral Daily Modena Jansky, MD   1 tablet at 01/30/19 0915  . ondansetron (ZOFRAN) injection 4 mg  4 mg Intravenous Q6H PRN Ivor Costa, MD      . thiamine (VITAMIN B-1) tablet 100 mg  100 mg Oral Daily Vernell Leep D, MD   100 mg at 01/28/19 1001   Or  . thiamine (B-1) injection 100 mg  100 mg Intravenous Daily Vernell Leep D, MD   100 mg at 01/30/19 0916    VITAL SIGNS: BP (!) 156/72 (BP Location: Left Arm)   Pulse 73   Temp 98.3 F (36.8 C) (Oral)   Resp 16   Ht 6' (1.829 m)   Wt 80.2 kg   SpO2 94%   BMI 23.98 kg/m  Filed Weights   01/23/19 1819 01/24/19 0650  Weight: 83 kg 80.2 kg    Estimated body mass index is 23.98 kg/m as calculated from the following:   Height as of this encounter: 6' (1.829 m).   Weight as of this encounter: 80.2 kg.  LABS: CBC:    Component Value Date/Time   WBC 13.9 (H) 01/30/2019 0213   HGB 15.6 01/30/2019 0213   HCT 47.9 01/30/2019 0213   PLT 205 01/30/2019 0213   Comprehensive Metabolic Panel:    Component Value Date/Time   NA 138 01/30/2019  0213   K 4.2 01/30/2019 0213   CO2 24 01/30/2019 0213   BUN 20 01/30/2019 0213   CREATININE 0.97 01/30/2019 0213   ALBUMIN 2.6 (L) 01/30/2019 0213     Review of Systems  Unable to perform ROS: Acuity of condition     Physical Exam Vitals signs and nursing note reviewed.   General: NAD, frail appearing, confused Cardiovascular: regular rate and rhythm Pulmonary: clear ant fields Abdomen: soft, nontender, + bowel sounds Extremities: no edema, no joint deformities Skin: no rashes Neurological: Weakness, confusion    Prognosis: Guarded in the setting of alcohol use, sepsis, pneumonia, hypertension, hyperlipidemia, BPH, bladder cancer, CKD stage Ryan Tyler, GERD, and deconditioning.   Discharge Planning:  Home with Home Health and Palliative   PLAN:  DNR/DNI-as confirmed by son  Continue to treat  Son goal is for patient to return home with home health and palliative support  PMT will continue to follow and support as needed       Palliative Performance Scale: PPS 40%               Family expressed understanding and was in agreement with this plan.   The above conversation was completed via telephone due to the visitor restrictions during the COVID-19 pandemic. Thorough chart review and discussion with necessary members of the care team was completed as part of assessment.   Thank you for allowing the Palliative Medicine Team to assist in the care of this patient.   Time In: 1430 Time Out: 1545 Total Time 75 min Prolonged Time Billed  NO    Visit consisted of counseling and education dealing with the complex and emotionally intense issues of symptom management and palliative care in the setting of serious and potentially life-threatening illness.Greater than 50%  of this time was spent counseling and coordinating care related to the above assessment and plan.  Signed by:  Alda Lea, AGPCNP-BC Palliative Medicine Team  Phone: [phone] Fax:  [phone] Pager: [phone] Amion: Bjorn Pippin

## 2019-01-30 NOTE — Evaluation (Signed)
Occupational Therapy Evaluation Patient Details Name: Ryan Tyler MRN: [id number] DOB: 1928-07-22 Today's Date: 01/30/2019    History of Present Illness Pt is an 85 y/o male admitted secondary to alcoholic pancreatitis. Pt also with alcohol withdrawals and AMS. Imaging revelaed RLL PNA. COVID testing negative. PMH includes HTN, alcohol abuse, GERD, CKD, and bladder cancer.    Clinical Impression   Pt EGB:TDVVOH alone, but family willing to participate in 24/7 tasks. Pt presents with deficits in ADL, mobility and safety. Pt followed all commands with pleasant demeanor. Pt currently performing ADL with set-upA to minA for grooming tasks at sink and standing for toilet hygiene. Pt performing tasks with increased assist at this time. Pt steady with RW with minguardA to minA for ambulation. Pt would benefit from continued OT skilled services for ADL and mobility in [place] setting. OT to follow acutely.    Follow Up Recommendations  Home health OT;Supervision/Assistance - 24 hour    Equipment Recommendations  None recommended by OT    Recommendations for Other Services       Precautions / Restrictions Precautions Precautions: Fall Restrictions Weight Bearing Restrictions: No      Mobility Bed Mobility Overal bed mobility: Needs Assistance Bed Mobility: Sit to Supine       Sit to supine: Supervision   General bed mobility comments: Supervision for safety to return to supine.   Transfers Overall transfer level: Needs assistance Equipment used: None Transfers: Sit to/from Stand Sit to Stand: Min guard         General transfer comment: Cues for hand placement    Balance Overall balance assessment: Needs assistance Sitting-balance support: No upper extremity supported;Feet supported Sitting balance-Leahy Scale: Fair     Standing balance support: No upper extremity supported;During functional activity Standing balance-Leahy Scale: Fair                              ADL either performed or assessed with clinical judgement   ADL Overall ADL's : Needs assistance/impaired Eating/Feeding: Set up;Sitting   Grooming: Set up;Sitting   Upper Body Bathing: Set up;Sitting   Lower Body Bathing: Minimal assistance;Sitting/lateral leans;Sit to/from stand   Upper Body Dressing : Set up;Sitting   Lower Body Dressing: Minimal assistance;Sitting/lateral leans;Sit to/from stand   Toilet Transfer: Min guard;Ambulation;Cueing for safety;Cueing for sequencing;Comfort height toilet;RW   Toileting- Clothing Manipulation and Hygiene: Moderate assistance;Cueing for safety;Cueing for sequencing;Sitting/lateral lean;Sit to/from stand       Functional mobility during ADLs: Minimal assistance;Min guard;Rolling walker;Cueing for safety;Cueing for sequencing General ADL Comments: Pt set-upA for UB ADL and modA for LB ADL     Vision Baseline Vision/History: No visual deficits Vision Assessment?: No apparent visual deficits     Perception     Praxis      Pertinent Vitals/Pain Pain Assessment: No/denies pain     Hand Dominance     Extremity/Trunk Assessment Upper Extremity Assessment Upper Extremity Assessment: Generalized weakness   Lower Extremity Assessment Lower Extremity Assessment: Defer to PT evaluation;Generalized weakness   Cervical / Trunk Assessment Cervical / Trunk Assessment: Kyphotic   Communication Communication Communication: HOH   Cognition Arousal/Alertness: Awake/alert Behavior During Therapy: Restless Overall Cognitive Status: No family/caregiver present to determine baseline cognitive functioning                                 General Comments: Pt standing up in  room with alarms going off upon entry. Pt followed commands with increased time and was easily redirectable.    General Comments  pt tolerating session well and following commands.    Exercises     Shoulder Instructions      Home Living  Family/patient expects to be discharged to:: Private residence Living Arrangements: Children Available Help at Discharge: Family;Available 24 hours/day Type of Home: House Home Access: Stairs to enter [employer] of Steps: 5 Entrance Stairs-Rails: Right;Left;Can reach both Home Layout: One level     Bathroom Shower/Tub: Teacher, early years/pre: Standard     Home Equipment: Environmental consultant - 2 wheels   Additional Comments: Information from son, as pt unable to answer home information.       Prior Functioning/Environment Level of Independence: Independent        Comments: Per son, if pt leaves the house, son reports he is with him.         OT Problem List: Decreased strength;Decreased activity tolerance;Decreased safety awareness      OT Treatment/Interventions: Self-care/ADL training;Therapeutic exercise;Neuromuscular education;Energy conservation;Therapeutic activities;Patient/family education;Balance training    OT Goals(Current goals can be found in the care plan section) Acute Rehab OT Goals Patient Stated Goal: for pt to return home per son OT Goal Formulation: With patient Time For Goal Achievement: 02/13/19 Potential to Achieve Goals: Good ADL Goals Pt Will Perform Lower Body Bathing: with set-up Pt/caregiver will Perform Home Exercise Program: Both right and left upper extremity;With written HEP provided;Independently Additional ADL Goal #1: Pt will perform ADL functional mobility with RW and fair balance with supervisionA  OT Frequency: Min 2X/week   Barriers to D/C:            Co-evaluation              AM-PAC OT "6 Clicks" Daily Activity     Outcome Measure Help from another person eating meals?: A Little Help from another person taking care of personal grooming?: A Little Help from another person toileting, which includes using toliet, bedpan, or urinal?: A Little Help from another person bathing (including washing, rinsing,  drying)?: A Lot Help from another person to put on and taking off regular upper body clothing?: A Little Help from another person to put on and taking off regular lower body clothing?: A Little 6 Click Score: 17   End of Session Equipment Utilized During Treatment: Gait belt;Rolling walker Nurse Communication: Mobility status  Activity Tolerance: Patient tolerated treatment well Patient left: in bed;with call bell/phone within reach;with bed alarm set  OT Visit Diagnosis: Unsteadiness on feet (R26.81);Muscle weakness (generalized) (M62.81)                Time: 7048-8891 OT Time Calculation (min): 24 min Charges:  OT General Charges $OT Visit: 1 Visit OT Evaluation $OT Eval Moderate Complexity: 1 Mod OT Treatments $Self Care/Home Management : 8-22 mins  Ebony Hail Harold Hedge) Marsa Aris OTR/L Acute Rehabilitation Services Pager: [phone] Office: [place] 01/30/2019, 1:17 PM

## 2019-01-30 NOTE — Progress Notes (Signed)
Modified Barium Swallow Progress Note  Patient Details  Name: Ryan Tyler MRN: [id number] Date of Birth: 01-01-1928  Today's Date: 01/30/2019  Modified Barium Swallow completed.  Full report located under Chart Review in the Imaging Section.  Brief recommendations include the following:  Clinical Impression  MBS completed with max encouragement needed for patient participation given grumpiness, confusion and poor hearing ability. WIth minimal trials of solids mastication was prolonged and inefficient with one minute needed for one bite of cracker (at bedside pt was more impulsive with incomplete mastication and rapid oral packing with frequent coughing). Pt observed to have trace silent penetration during consecutive swallows of thin liquids and trace silent aspiration of oral residuals spilling to pyriforms post swallow. Pt would not attempt a chin tuck despite cueing. A throat clear was successful in clearing aspirate, but pt needed excessive cueing to understand SLP instruction. Did not test nectar or complete esophageal sweep given pts limited participation. Overall quantitiy of aspiration is minimal and moderate deficits and risk can be mitigated with precautions and careful assist with meals without significant diet modification. For now will change solids texture to fine chopped, but keep liquids thin with encouragement for a throat clear intermittently and discuss with pts son.    Swallow Evaluation Recommendations       SLP Diet Recommendations: Dysphagia 2 (Fine chop) solids;Thin liquid   Liquid Administration via: Cup;Straw   Medication Administration: Whole meds with puree   Supervision: Staff to assist with self feeding   Compensations: Minimize environmental distractions;Slow rate;Small sips/bites;Clear throat intermittently   Postural Changes: Seated upright at 90 degrees           Herbie Baltimore, MA CCC-SLP  Acute Rehabilitation Services Pager  [phone] Office [phone]  Othelia Pulling, Katherene Ponto 01/30/2019,3:17 PM

## 2019-01-30 NOTE — Progress Notes (Addendum)
Patient ID: Ryan Tyler, male   DOB: March 28, 1928, 85 y.o.   MRN: [id number]  PROGRESS NOTE    Ryan Tyler  JJO:[id number] DOB: [birth year] DOA: 01/23/2019 PCP: Josetta Huddle, MD   Brief Narrative:  85 year old male with history of hypertension, hyperlipidemia, GERD, stage III CKD, hard of hearing but refuses hearing aids, BPH, bladder cancer status post TURBT and intravesical chemotherapy in 2015, laparoscopic cholecystectomy in 2016, alcohol use presented on 01/23/2019 with abdominal pain, nausea and poor oral intake.  He was found to have acute pancreatitis on CT of the abdomen and pelvis.  He developed fever, dyspnea and wheezing with chest x-ray demonstrating right lower lobe infiltrate.  COVID testing was negative.  He also has been having delirium which is being treated medically.  Assessment & Plan:   Principal Problem:   Acute pancreatitis Active Problems:   Hypertension   Metabolic acidemia   CKD (chronic kidney disease), stage III (HCC)   Leukocytosis   SIRS (systemic inflammatory response syndrome) (HCC)   Lobar pneumonia (HCC)   Sepsis (HCC)  Acute alcoholic pancreatitis -Clinically improving.  Tolerating diet.  Abdominal exam is benign  Alcohol dependence and withdrawal/acute metabolic encephalopathy -Still has intermittent delirium.  Soft restraints as needed.  Continue Librium taper; will still need to 5 mg twice a day today and stop tomorrow.  Continue CIWA protocol with multivitamin, thiamine and folate. -Fall precautions.  PT evaluation.  Right lower lobe pneumonia -Respiratory status stable.  Cultures negative so far -Initially was treated with Rocephin and Zithromax, which was switched to Unasyn and subsequently to Augmentin.  Antibiotics were discontinued on 01/29/2019. -No temperatures overnight. -COVID-19 testing was negative and hence ruled out.  Sepsis: Present on admission -Resolved.  Hemodynamically stable  Hypertension--monitor blood pressure.  Continue  metoprolol.  Thrombocytopenia--resolved  Chronic kidney disease stage III -Stable  Renal stone and kidney cyst -CT scan incidentaly showed nonobstructing 1 cm left renal stone. Hyperdense cyst lower pole left kidney. Indistinct 2 cm region in the upper pole of the left kidney, similar to the study of 11 months ago, indeterminate. This could possibly represent a renal mass lesion.  - Needs to f/u with [facility] urology in a month as recommended post discharge.  Generalized deconditioning -Follow PT/OT recommendation.  Palliative care also will be consulted for goals of care.    DVT prophylaxis: Lovenox Code Status: Full Family Communication: None at bedside.  Spoke to son/Rajah on phone Disposition Plan: Depends on clinical outcome.  Probable discharge home in the next 1 to 2 days  Consultants: No  Procedures: None  Antimicrobials:   Ceftriaxone, azithromycin 4/3 - 4/6  Unasyn 4/7-4/9  1 dose of Augmentin   Subjective: Patient seen and examined at bedside.  He is awake, confused.  Very poor historian.  No overnight fever or diarrhea reported.  Nursing staff reports that patient intermittently tries to climb out of bed.  Patient did not require any Haldol or Ativan overnight.  Objective: Vitals:   01/29/19 2015 01/29/19 2144 01/30/19 0504 01/30/19 0832  BP:  (!) 169/94 (!) 161/72   Pulse:  100 69   Resp:      Temp:  97.6 F (36.4 C) 98.4 F (36.9 C)   TempSrc:  Oral Oral   SpO2: 92% 96% 95% 92%  Weight:      Height:        Intake/Output Summary (Last 24 hours) at 01/30/2019 1054 Last data filed at 01/30/2019 0729 Gross per 24 hour  Intake 240  ml  Output 325 ml  Net -85 ml   Filed Weights   01/23/19 1819 01/24/19 0650  Weight: 83 kg 80.2 kg    Examination:  General exam: Looks chronically ill.  No distress. elderly male lying in bed, awake but hardly answers questions.  Looks confused. Respiratory system: Bilateral decreased breath sounds at bases, some  scattered crackles.  No wheezing Cardiovascular system: S1 & S2 heard, Rate controlled Gastrointestinal system: Abdomen is nondistended, soft and nontender. Normal bowel sounds heard. Extremities: No cyanosis; trace edema   Data Reviewed: I have personally reviewed following labs and imaging studies  CBC: Recent Labs  Lab 01/25/19 0857 01/26/19 0715 01/27/19 0143 01/28/19 0218 01/30/19 0213  WBC 12.6* 12.0* 12.8* 13.0* 13.9*  NEUTROABS  --   --   --   --  10.3*  HGB 15.3 14.5 14.9 15.3 15.6  HCT 47.0 46.8 45.8 46.6 47.9  MCV 96.3 97.7 95.6 94.9 95.8  PLT 113* 129* 152 163 213   Basic Metabolic Panel: Recent Labs  Lab 01/24/19 0314 01/25/19 0857 01/28/19 0218 01/29/19 0236 01/30/19 0213  NA 138 137 134* 140 138  K 4.9 4.0 4.0 4.2 4.2  CL 106 107 105 103 104  CO2 21* 24 22 30 24   GLUCOSE 111* 111* 103* 118* 152*  BUN 17 16 16 15 20   CREATININE 1.13 1.04 0.95 0.95 0.97  CALCIUM 8.3* 8.1* 8.5* 8.9 8.8*  MG  --   --  1.7  --  1.7   GFR: Estimated Creatinine Clearance: 56.7 mL/min (by C-G formula based on SCr of 0.97 mg/dL). Liver Function Tests: Recent Labs  Lab 01/23/19 1717 01/25/19 0857 01/28/19 0218 01/29/19 0236 01/30/19 0213  AST 29 17 37 32 28  ALT 17 QUANTITY NOT SUFFICIENT, UNABLE TO PERFORM TEST 17 20 22   ALKPHOS 141* 89 92 84 87  BILITOT 1.1 QUANTITY NOT SUFFICIENT, UNABLE TO PERFORM TEST 1.0 0.8 0.6  PROT 6.4* 4.8* 5.4* 5.2* 5.6*  ALBUMIN 3.8 2.6* 2.6* 2.5* 2.6*   Recent Labs  Lab 01/23/19 1717 01/25/19 0857  LIPASE 894* 44   No results for input(s): AMMONIA in the last 168 hours. Coagulation Profile: No results for input(s): INR, PROTIME in the last 168 hours. Cardiac Enzymes: No results for input(s): CKTOTAL, CKMB, CKMBINDEX, TROPONINI in the last 168 hours. BNP (last 3 results) No results for input(s): PROBNP in the last 8760 hours. HbA1C: No results for input(s): HGBA1C in the last 72 hours. CBG: No results for input(s): GLUCAP in  the last 168 hours. Lipid Profile: No results for input(s): CHOL, HDL, LDLCALC, TRIG, CHOLHDL, LDLDIRECT in the last 72 hours. Thyroid Function Tests: No results for input(s): TSH, T4TOTAL, FREET4, T3FREE, THYROIDAB in the last 72 hours. Anemia Panel: No results for input(s): VITAMINB12, FOLATE, FERRITIN, TIBC, IRON, RETICCTPCT in the last 72 hours. Sepsis Labs: Recent Labs  Lab 01/23/19 2007 01/23/19 2125 01/23/19 2126 01/24/19 0023  PROCALCITON  --   --  <0.10  --   LATICACIDVEN 5.3* 4.9*  --  2.6*    Recent Results (from the past 240 hour(s))  Culture, blood (x 2)     Status: None   Collection Time: 01/23/19  9:10 PM  Result Value Ref Range Status   Specimen Description BLOOD RIGHT ANTECUBITAL  Final   Special Requests   Final    BOTTLES DRAWN AEROBIC AND ANAEROBIC Blood Culture results may not be optimal due to an inadequate volume of blood received in culture bottles  Culture   Final    NO GROWTH 5 DAYS Performed at [facility], [place] [address] [place], [place] [address]    Report Status 01/28/2019 FINAL  Final  Culture, blood (x 2)     Status: None   Collection Time: 01/23/19  9:26 PM  Result Value Ref Range Status   Specimen Description BLOOD BLOOD RIGHT FOREARM  Final   Special Requests   Final    BOTTLES DRAWN AEROBIC AND ANAEROBIC Blood Culture results may not be optimal due to an inadequate volume of blood received in culture bottles   Culture   Final    NO GROWTH 5 DAYS Performed at [facility], [place] [address] [place], [place] [address]    Report Status 01/28/2019 FINAL  Final  Novel Coronavirus, NAA (hospital order; send-out to ref lab)     Status: None   Collection Time: 01/23/19 10:55 PM  Result Value Ref Range Status   SARS-CoV-2, NAA NOT DETECTED NOT DETECTED Final    Comment: Performed at [place]  Final    Comment: Performed at [facility], [place] [address] [place], [place] [address]   MRSA PCR Screening     Status: None   Collection Time: 01/24/19  6:57 AM  Result Value Ref Range Status   MRSA by PCR NEGATIVE NEGATIVE Final    Comment:        The GeneXpert MRSA Assay (FDA approved for NASAL specimens only), is one component of a comprehensive MRSA colonization surveillance program. It is not intended to diagnose MRSA infection nor to guide or monitor treatment for MRSA infections. Performed at [facility], [place] [address] [place], [place] [address]          Radiology Studies: No results found.      Scheduled Meds: . chlordiazePOXIDE  5 mg Oral TID  . cholecalciferol  1,000 Units Oral Once per day on Mon Thu  . enoxaparin (LOVENOX) injection  40 mg Subcutaneous Q24H  . famotidine  20 mg Oral BID  . folic acid  1 mg Oral Daily  . LORazepam  0-4 mg Oral Q12H  . metoprolol tartrate  50 mg Oral BID  . multivitamin with minerals  1 tablet Oral Daily  . thiamine  100 mg Oral Daily   Or  . thiamine  100 mg Intravenous Daily   Continuous Infusions:    LOS: 6 days        Aline August, MD [facility] 01/30/2019, 10:54 AM

## 2019-01-31 LAB — COMPREHENSIVE METABOLIC PANEL
ALT: 21 U/L (ref 0–44)
AST: 21 U/L (ref 15–41)
Albumin: 2.5 g/dL — ABNORMAL LOW (ref 3.5–5.0)
Alkaline Phosphatase: 81 U/L (ref 38–126)
Anion gap: 9 (ref 5–15)
BUN: 14 mg/dL (ref 8–23)
CO2: 29 mmol/L (ref 22–32)
Calcium: 8.9 mg/dL (ref 8.9–10.3)
Chloride: 102 mmol/L (ref 98–111)
Creatinine, Ser: 0.96 mg/dL (ref 0.61–1.24)
GFR calc Af Amer: >60 mL/min (ref >60)
GFR calc non Af Amer: >60 mL/min (ref >60)
Glucose, Bld: 111 mg/dL — ABNORMAL HIGH (ref 70–99)
Potassium: 4 mmol/L (ref 3.5–5.1)
Sodium: 140 mmol/L (ref 135–145)
Total Bilirubin: 0.8 mg/dL (ref 0.3–1.2)
Total Protein: 5.1 g/dL — ABNORMAL LOW (ref 6.5–8.1)

## 2019-01-31 MED ORDER — CHLORDIAZEPOXIDE HCL 5 MG PO CAPS: 5.0000 mg | ORAL_CAPSULE | Freq: Every day | ORAL | Status: DC

## 2019-01-31 NOTE — Progress Notes (Signed)
Patient ID: Ryan Tyler, male   DOB: July 23, 1928, 85 y.o.   MRN: [id number]  PROGRESS NOTE    Ryan Tyler  WUJ:[id number] DOB: 01-03-1928 DOA: 01/23/2019 PCP: Ryan Huddle, MD   Brief Narrative:  85 year old male with history of hypertension, hyperlipidemia, GERD, stage III CKD, hard of hearing but refuses hearing aids, BPH, bladder cancer status post TURBT and intravesical chemotherapy in 2015, laparoscopic cholecystectomy in 2016, alcohol use presented on 01/23/2019 with abdominal pain, nausea and poor oral intake.  He was found to have acute pancreatitis on CT of the abdomen and pelvis.  He developed fever, dyspnea and wheezing with chest x-ray demonstrating right lower lobe infiltrate.  COVID testing was negative.  He also has been having delirium which is being treated medically.  Assessment & Plan:   Principal Problem:   Acute pancreatitis Active Problems:   Hypertension   Metabolic acidemia   CKD (chronic kidney disease), stage III (HCC)   Leukocytosis   SIRS (systemic inflammatory response syndrome) (HCC)   Lobar pneumonia (HCC)   Sepsis (HCC)  Acute alcoholic pancreatitis -Clinically improving.  Tolerating diet.  Abdominal exam is benign  Alcohol dependence and withdrawal/acute metabolic encephalopathy -Still has intermittent delirium.  Soft restraints as needed.  Continue Librium taper; will decrease to 5 mg daily today and stop tomorrow.  Patient was agitated early this morning and had received Haldol.  Will monitor for 1 more day.  Continue CIWA protocol with multivitamin, thiamine and folate. -Fall precautions.  -PT recommends home health PT. -Diet as per SLP recommendations.  Right lower lobe pneumonia -Respiratory status stable.  Cultures negative so far -Initially was treated with Rocephin and Zithromax, which was switched to Unasyn and subsequently to Augmentin.  Antibiotics were discontinued on 01/29/2019. -No temperatures overnight. -COVID-19 testing was negative  and hence ruled out.  Sepsis: Present on admission -Resolved.  Hemodynamically stable  Hypertension--monitor blood pressure.  Continue metoprolol and lisinopril.  Thrombocytopenia--resolved  Chronic kidney disease stage III -Stable  Renal stone and kidney cyst -CT scan incidentaly showed nonobstructing 1 cm left renal stone. Hyperdense cyst lower pole left kidney. Indistinct 2 cm region in the upper pole of the left kidney, similar to the study of 11 months ago, indeterminate. This could possibly represent a renal mass lesion.  - Needs to f/u with [facility] urology in a month as recommended post discharge.  Generalized deconditioning -Follow PT/OT recommendation.  Palliative care evaluation appreciated.   DVT prophylaxis: Lovenox Code Status: DNR  family Communication: None at bedside.  Spoke to son/Ryan Tyler on phone on 01/30/2019 and CODE STATUS was changed to DNR subsequently. Disposition Plan:  Probable discharge home in the next 1 to 2 days if mental status remains stable.  Consultants: No  Procedures: None  Antimicrobials:   Ceftriaxone, azithromycin 4/3 - 4/6  Unasyn 4/7-4/9  1 dose of Augmentin  on 01/29/2019.  Subjective: Patient seen and examined at bedside.  He is awake, confused.  Very poor historian.  No overnight fever or diarrhea reported.  Nursing staff reports that patient was intermittently agitated overnight and had required Haldol.  Objective: Vitals:   01/30/19 2105 01/30/19 2215 01/31/19 0441 01/31/19 0507  BP: (!) 178/91 (!) 156/72  (!) 188/80  Pulse: 74 73  71  Resp: 20 16  16   Temp: 98.3 F (36.8 C) 98.3 F (36.8 C)  97.9 F (36.6 C)  TempSrc: Oral Oral  Oral  SpO2: 95% 94%  94%  Weight:   78.1 kg   Height:  Intake/Output Summary (Last 24 hours) at 01/31/2019 1030 Last data filed at 01/31/2019 0258 Gross per 24 hour  Intake -  Output 450 ml  Net -450 ml   Filed Weights   01/23/19 1819 01/24/19 0650 01/31/19 0441  Weight: 83 kg 80.2 kg  78.1 kg    Examination:  General exam: Looks chronically ill.  No distress. elderly male lying in bed, awake but hardly answers questions.  Looks confused. Respiratory system: Bilateral decreased breath sounds at bases, some scattered crackles.   Cardiovascular system: Rate controlled, S1-S2 heard Gastrointestinal system: Abdomen is nondistended, soft and nontender. Normal bowel sounds heard. Extremities: No cyanosis; trace edema   Data Reviewed: I have personally reviewed following labs and imaging studies  CBC: Recent Labs  Lab 01/25/19 0857 01/26/19 0715 01/27/19 0143 01/28/19 0218 01/30/19 0213  WBC 12.6* 12.0* 12.8* 13.0* 13.9*  NEUTROABS  --   --   --   --  10.3*  HGB 15.3 14.5 14.9 15.3 15.6  HCT 47.0 46.8 45.8 46.6 47.9  MCV 96.3 97.7 95.6 94.9 95.8  PLT 113* 129* 152 163 237   Basic Metabolic Panel: Recent Labs  Lab 01/25/19 0857 01/28/19 0218 01/29/19 0236 01/30/19 0213 01/31/19 0317  NA 137 134* 140 138 140  K 4.0 4.0 4.2 4.2 4.0  CL 107 105 103 104 102  CO2 24 22 30 24 29   GLUCOSE 111* 103* 118* 152* 111*  BUN 16 16 15 20 14   CREATININE 1.04 0.95 0.95 0.97 0.96  CALCIUM 8.1* 8.5* 8.9 8.8* 8.9  MG  --  1.7  --  1.7  --    GFR: Estimated Creatinine Clearance: 57.3 mL/min (by C-G formula based on SCr of 0.96 mg/dL). Liver Function Tests: Recent Labs  Lab 01/25/19 0857 01/28/19 0218 01/29/19 0236 01/30/19 0213 01/31/19 0317  AST 17 37 32 28 21  ALT QUANTITY NOT SUFFICIENT, UNABLE TO PERFORM TEST 17 20 22 21   ALKPHOS 89 92 84 87 81  BILITOT QUANTITY NOT SUFFICIENT, UNABLE TO PERFORM TEST 1.0 0.8 0.6 0.8  PROT 4.8* 5.4* 5.2* 5.6* 5.1*  ALBUMIN 2.6* 2.6* 2.5* 2.6* 2.5*   Recent Labs  Lab 01/25/19 0857  LIPASE 44   No results for input(s): AMMONIA in the last 168 hours. Coagulation Profile: No results for input(s): INR, PROTIME in the last 168 hours. Cardiac Enzymes: No results for input(s): CKTOTAL, CKMB, CKMBINDEX, TROPONINI in the last  168 hours. BNP (last 3 results) No results for input(s): PROBNP in the last 8760 hours. HbA1C: No results for input(s): HGBA1C in the last 72 hours. CBG: No results for input(s): GLUCAP in the last 168 hours. Lipid Profile: No results for input(s): CHOL, HDL, LDLCALC, TRIG, CHOLHDL, LDLDIRECT in the last 72 hours. Thyroid Function Tests: No results for input(s): TSH, T4TOTAL, FREET4, T3FREE, THYROIDAB in the last 72 hours. Anemia Panel: No results for input(s): VITAMINB12, FOLATE, FERRITIN, TIBC, IRON, RETICCTPCT in the last 72 hours. Sepsis Labs: No results for input(s): PROCALCITON, LATICACIDVEN in the last 168 hours.  Recent Results (from the past 240 hour(s))  Culture, blood (x 2)     Status: None   Collection Time: 01/23/19  9:10 PM  Result Value Ref Range Status   Specimen Description BLOOD RIGHT ANTECUBITAL  Final   Special Requests   Final    BOTTLES DRAWN AEROBIC AND ANAEROBIC Blood Culture results may not be optimal due to an inadequate volume of blood received in culture bottles   Culture   Final  NO GROWTH 5 DAYS Performed at [facility], [place] [address] [place], [place] [address]    Report Status 01/28/2019 FINAL  Final  Culture, blood (x 2)     Status: None   Collection Time: 01/23/19  9:26 PM  Result Value Ref Range Status   Specimen Description BLOOD BLOOD RIGHT FOREARM  Final   Special Requests   Final    BOTTLES DRAWN AEROBIC AND ANAEROBIC Blood Culture results may not be optimal due to an inadequate volume of blood received in culture bottles   Culture   Final    NO GROWTH 5 DAYS Performed at [facility], [place] [address], [place], [place] [address]    Report Status 01/28/2019 FINAL  Final  Novel Coronavirus, NAA (hospital order; send-out to ref lab)     Status: None   Collection Time: 01/23/19 10:55 PM  Result Value Ref Range Status   SARS-CoV-2, NAA NOT DETECTED NOT DETECTED Final    Comment: Performed at [place]  Final    Comment: Performed at [facility], [place] [address] [place], [place] [address]  MRSA PCR Screening     Status: None   Collection Time: 01/24/19  6:57 AM  Result Value Ref Range Status   MRSA by PCR NEGATIVE NEGATIVE Final    Comment:        The GeneXpert MRSA Assay (FDA approved for NASAL specimens only), is one component of a comprehensive MRSA colonization surveillance program. It is not intended to diagnose MRSA infection nor to guide or monitor treatment for MRSA infections. Performed at [facility], [place] [address] [place],  [address]          Radiology Studies: Dg Swallowing Func-speech Pathology  Result Date: 01/30/2019 Objective Swallowing Evaluation: Type of Study: MBS-Modified Barium Swallow Study  Patient Details Name: RUBE SANCHEZ MRN: [id number] Date of Birth: 03-14-1928 Today's Date: 01/30/2019 Time: SLP Start Time (ACUTE ONLY): 1440 -SLP Stop Time (ACUTE ONLY): 1500 SLP Time Calculation (min) (ACUTE ONLY): 20 min Past Medical History: Past Medical History: Diagnosis Date . Asymptomatic cholelithiasis  . Bladder tumor  . BPH (benign prostatic hypertrophy)  . Cancer ([place])   bladder . GERD (gastroesophageal reflux disease)  . Hearing loss  . HOH (hard of hearing)   no hearing aids . Hyperlipidemia  . Hypertension  . Kidney stones  . Mild left inguinal hernia  . Nocturia  . Urgency of urination  Past Surgical History: Past Surgical History: Procedure Laterality Date . CATARACT EXTRACTION W/ INTRAOCULAR LENS IMPLANT Left nov  2014 . CHOLECYSTECTOMY N/A 08/16/2015  Procedure: LAPAROSCOPIC CHOLECYSTECTOMY;  Surgeon: Rolm Bookbinder, MD;  Location: [facility];  Service: General;  Laterality: N/A; . TONSILLECTOMY   . TRANSURETHRAL RESECTION OF BLADDER TUMOR WITH GYRUS (TURBT-GYRUS) N/A 04/05/2014  Procedure: TRANSURETHRAL RESECTION OF BLADDER TUMOR WITH GYRUS  AND INTRAVESICO CHEMO;  Surgeon: Claybon Jabs, MD;  Location: [facility];  Service: Urology;  Laterality: N/A; . TRANSURETHRAL RESECTION OF PROSTATE  722 HPI: 85 year old male with history of hypertension, hyperlipidemia, GERD, stage III CKD, hard of hearing but refuses hearing aids, BPH, bladder cancer status post TURBT and intravesical chemotherapy in 2015, laparoscopic cholecystectomy in 2016, alcohol use presented on 01/23/2019 with abdominal pain, nausea and poor oral intake.  He was found to have acute pancreatitis on CT of the abdomen and pelvis.  He developed fever, dyspnea and wheezing with chest  x-ray demonstrating right lower lobe infiltrate.  [place] testing was negative.  He also has been having delirium which is being treated medically.  COVID testing sent and returned negative  Subjective: Pt alert, confused Assessment / Plan / Recommendation CHL IP CLINICAL IMPRESSIONS 01/30/2019 Clinical Impression MBS completed with max encouragement needed for patient participation given grumpiness, confusion and poor hearing ability. WIth minimal trials of solids mastication was prolonged and inefficient with one minute needed for one bite of cracker (at bedside pt was more impulsive with incomplete mastication and rapid oral packing with frequent coughing). Pt observed to have trace silent penetration during consecutive swallows of thin liquids and trace silent aspiration of oral residuals spilling to pyriforms post swallow. Pt would not attempt a chin tuck despite cueing. A throat clear was successful in clearing aspirate, but pt needed excessive cueing to understand SLP instruction. Did not test nectar or complete esophageal sweep given pts limited participation. Overall quantity of aspiration is minimal and moderate deficits and risk can be mitigated with precautions and careful assist with meals without significant diet modification. For now will change solids texture to fine chopped, but keep liquids thin with encouragement for a throat clear intermittently and  discuss with pts son.  SLP Visit Diagnosis Dysphagia, oropharyngeal phase (R13.12) Attention and concentration deficit following -- Frontal lobe and executive function deficit following -- Impact on safety and function Moderate aspiration risk   CHL IP TREATMENT RECOMMENDATION 01/30/2019 Treatment Recommendations Therapy as outlined in treatment plan below   Prognosis 01/30/2019 Prognosis for Safe Diet Advancement Fair Barriers to Reach Goals Cognitive deficits Barriers/Prognosis Comment -- CHL IP DIET RECOMMENDATION 01/30/2019 SLP Diet Recommendations Dysphagia 2 (Fine chop) solids;Thin liquid Liquid Administration via Cup;Straw Medication Administration Whole meds with puree Compensations Minimize environmental distractions;Slow rate;Small sips/bites;Clear throat intermittently Postural Changes Seated upright at 90 degrees   No flowsheet data found.  CHL IP FOLLOW UP RECOMMENDATIONS 01/30/2019 Follow up Recommendations 24 hour supervision/assistance   CHL IP FREQUENCY AND DURATION 01/30/2019 Speech Therapy Frequency (ACUTE ONLY) min 2x/week Treatment Duration 2 weeks      CHL IP ORAL PHASE 01/30/2019 Oral Phase Impaired Oral - Pudding Teaspoon -- Oral - Pudding Cup -- Oral - Honey Teaspoon -- Oral - Honey Cup -- Oral - Nectar Teaspoon -- Oral - Nectar Cup -- Oral - Nectar Straw -- Oral - Thin Teaspoon -- Oral - Thin Cup WFL Oral - Thin Straw WFL Oral - Puree WFL Oral - Mech Soft -- Oral - Regular Delayed oral transit;Decreased bolus cohesion;Impaired mastication Oral - Multi-Consistency -- Oral - Pill -- Oral Phase - Comment --  CHL IP PHARYNGEAL PHASE 01/30/2019 Pharyngeal Phase Impaired Pharyngeal- Pudding Teaspoon -- Pharyngeal -- Pharyngeal- Pudding Cup -- Pharyngeal -- Pharyngeal- Honey Teaspoon -- Pharyngeal -- Pharyngeal- Honey Cup -- Pharyngeal -- Pharyngeal- Nectar Teaspoon -- Pharyngeal -- Pharyngeal- Nectar Cup -- Pharyngeal -- Pharyngeal- Nectar Straw -- Pharyngeal -- Pharyngeal- Thin Teaspoon -- Pharyngeal  -- Pharyngeal- Thin Cup Penetration/Aspiration during swallow;Reduced airway/laryngeal closure;Trace aspiration;Penetration/Apiration after swallow Pharyngeal Material enters airway, remains ABOVE vocal cords and not ejected out;Material enters airway, passes BELOW cords without attempt by patient to eject out (silent aspiration) Pharyngeal- Thin Straw Penetration/Aspiration during swallow;Reduced airway/laryngeal closure;Trace aspiration Pharyngeal Material enters airway, CONTACTS cords and not ejected out Pharyngeal- Puree WFL Pharyngeal -- Pharyngeal- Mechanical Soft -- Pharyngeal -- Pharyngeal- Regular WFL Pharyngeal -- Pharyngeal- Multi-consistency -- Pharyngeal -- Pharyngeal- Pill -- Pharyngeal -- Pharyngeal Comment --  No flowsheet data found. Herbie Baltimore, MA CCC-SLP  Acute Rehabilitation Services Pager [phone] Office [phone] Lynann Beaver 01/30/2019, 3:22 PM                   Scheduled Meds: . chlordiazePOXIDE  5 mg Oral BID  . cholecalciferol  1,000 Units Oral Once per day on Mon Thu  . enoxaparin (LOVENOX) injection  40 mg Subcutaneous Q24H  . famotidine  20 mg Oral BID  . folic acid  1 mg Oral Daily  . lisinopril  10 mg Oral Daily  . metoprolol tartrate  50 mg Oral BID  . multivitamin with minerals  1 tablet Oral Daily  . thiamine  100 mg Oral Daily   Or  . thiamine  100 mg Intravenous Daily   Continuous Infusions:    LOS: 7 days        Aline August, MD [facility] 01/31/2019, 10:30 AM

## 2019-02-01 LAB — CBC WITH DIFFERENTIAL/PLATELET
Abs Immature Granulocytes: 0.14 10*3/uL — ABNORMAL HIGH (ref 0.00–0.07)
Basophils Absolute: 0.1 10*3/uL (ref 0.0–0.1)
Basophils Relative: 1 %
Eosinophils Absolute: 0.3 10*3/uL (ref 0.0–0.5)
Eosinophils Relative: 3 %
HCT: 46.9 % (ref 39.0–52.0)
Hemoglobin: 14.5 g/dL (ref 13.0–17.0)
Immature Granulocytes: 1 %
Lymphocytes Relative: 8 %
Lymphs Abs: 0.9 10*3/uL (ref 0.7–4.0)
MCH: 30 pg (ref 26.0–34.0)
MCHC: 30.9 g/dL (ref 30.0–36.0)
MCV: 97.1 fL (ref 80.0–100.0)
Monocytes Absolute: 1.2 10*3/uL — ABNORMAL HIGH (ref 0.1–1.0)
Monocytes Relative: 11 %
Neutro Abs: 8.5 10*3/uL — ABNORMAL HIGH (ref 1.7–7.7)
Neutrophils Relative %: 76 %
Platelets: 229 10*3/uL (ref 150–400)
RBC: 4.83 MIL/uL (ref 4.22–5.81)
RDW: 13 % (ref 11.5–15.5)
WBC: 11.1 10*3/uL — ABNORMAL HIGH (ref 4.0–10.5)
nRBC: 0 % (ref 0.0–0.2)

## 2019-02-01 LAB — BASIC METABOLIC PANEL
Anion gap: 11 (ref 5–15)
BUN: 17 mg/dL (ref 8–23)
CO2: 28 mmol/L (ref 22–32)
Calcium: 9.3 mg/dL (ref 8.9–10.3)
Chloride: 100 mmol/L (ref 98–111)
Creatinine, Ser: 0.93 mg/dL (ref 0.61–1.24)
GFR calc Af Amer: >60 mL/min (ref >60)
GFR calc non Af Amer: >60 mL/min (ref >60)
Glucose, Bld: 120 mg/dL — ABNORMAL HIGH (ref 70–99)
Potassium: 4.2 mmol/L (ref 3.5–5.1)
Sodium: 139 mmol/L (ref 135–145)

## 2019-02-01 LAB — MAGNESIUM: Magnesium: 1.8 mg/dL (ref 1.7–2.4)

## 2019-02-01 MED ORDER — ONDANSETRON HCL 4 MG PO TABS: 4.0000 mg | ORAL_TABLET | Freq: Three times a day (TID) | ORAL | 0 refills | Status: AC | PRN

## 2019-02-01 MED ORDER — METOPROLOL TARTRATE 50 MG PO TABS: 50.0000 mg | ORAL_TABLET | Freq: Two times a day (BID) | ORAL | 0 refills | Status: DC

## 2019-02-01 MED ORDER — ADULT MULTIVITAMIN W/MINERALS CH: 1.0000 | ORAL_TABLET | Freq: Every day | ORAL | Status: DC

## 2019-02-01 MED ORDER — FOLIC ACID 1 MG PO TABS: 1.0000 mg | ORAL_TABLET | Freq: Every day | ORAL | 0 refills | Status: DC

## 2019-02-01 MED ORDER — THIAMINE HCL 100 MG PO TABS: 100.0000 mg | ORAL_TABLET | Freq: Every day | ORAL | 0 refills | Status: DC

## 2019-02-01 MED ORDER — LISINOPRIL 20 MG PO TABS: 20.0000 mg | ORAL_TABLET | Freq: Every day | ORAL | 0 refills | Status: DC

## 2019-02-01 NOTE — Progress Notes (Signed)
Nsg Discharge Note  Admit Date:  01/23/2019 Discharge date: 02/01/2019   Ryan Tyler to be D/C'd Home per MD order.    Discharge Assessment: Vitals:   01/31/19 2108 02/01/19 0540  BP: (!) 183/92 (!) 174/77  Pulse: 74 66  Resp:    Temp: 97.7 F (36.5 C) 98.1 F (36.7 C)  SpO2: 97% 94%   Skin clean, dry and intact without evidence of skin break down, no evidence of skin tears noted. IV catheter discontinued intact. Site without signs and symptoms of complications - no redness or edema noted at insertion site, patient denies c/o pain - only slight tenderness at site.  Dressing with slight pressure applied.  D/c Instructions-Education: Discharge instructions given to patient/family with verbalized understanding. D/c education completed with patient/family including follow up instructions, medication list, d/c activities limitations if indicated, with other d/c instructions as indicated by MD - patient able to verbalize understanding, all questions fully answered. Patient instructed to return to ED, call 911, or call MD for any changes in condition.  Patient escorted via [place], and D/C home via private auto.  Hiram Comber, RN 02/01/2019 9:00 AM

## 2019-02-01 NOTE — Discharge Summary (Signed)
Physician Discharge Summary  Ryan Tyler DGL:[id number] DOB: 25-Oct-1927 DOA: 01/23/2019  PCP: Josetta Huddle, MD  Admit date: 01/23/2019 Discharge date: 02/01/2019  Admitted From: Home Disposition: Home  Recommendations for Outpatient Follow-up:  1. Follow up with PCP in 1 week with repeat CBC/BMP 2. Outpatient follow-up with palliative care  3. Follow up in ED if symptoms worsen or new appear   Home Health: Home health RN/OT/PT Equipment/Devices: None  Discharge Condition: Guarded CODE STATUS: DNR Diet recommendation: Heart healthy/as per SLP recommendations  Brief/Interim Summary: 85 year old male with history of hypertension, hyperlipidemia, GERD, stage III CKD, hard of hearing but refuses hearing aids, BPH, bladder cancer status post TURBT and intravesical chemotherapy in 2015, laparoscopic cholecystectomy in 2016, alcohol use presented on 01/23/2019 with abdominal pain, nausea and poor oral intake.  He was found to have acute pancreatitis on CT of the abdomen and pelvis.  He developed fever, dyspnea and wheezing with chest x-ray demonstrating right lower lobe infiltrate.  COVID testing was negative.  He also has been having delirium which is being treated medically.  He has completed antibiotic course.  He has had intermittent delirium but has not required any Ativan over the last 24 hours.  He will be discharged home with home home health.  Discharge Diagnoses:  Principal Problem:   Acute pancreatitis Active Problems:   Hypertension   Metabolic acidemia   CKD (chronic kidney disease), stage III (HCC)   Leukocytosis   SIRS (systemic inflammatory response syndrome) (HCC)   Lobar pneumonia (HCC)   Sepsis (HCC)  Acute alcoholic pancreatitis -Clinically improving.  Tolerating diet.  Abdominal exam is benign  Alcohol dependence and withdrawal/acute metabolic encephalopathy -Patient had intermittent delirium and required intermittent soft restraints.  Also was treated with Librium  taper.  Librium has been discontinued from today.  Patient has not required Haldol for more than 24 hours.  Continue multivitamin, thiamine and folate. -PT recommends home health PT. -Diet as per SLP recommendations.  Right lower lobe pneumonia -Respiratory status stable.  Cultures negative so far -Initially was treated with Rocephin and Zithromax, which was switched to Unasyn and subsequently to Augmentin.  Antibiotics were discontinued on 01/29/2019. -No temperatures overnight. -COVID-19 testing was negative and hence ruled out.  Sepsis: Present on admission -Resolved.  Hemodynamically stable  Hypertension--monitor blood pressure.  Continue metoprolol and lisinopril.  Outpatient follow-up  Thrombocytopenia--resolved  Chronic kidney disease stage III -Stable  Renal stone and kidney cyst -CT scan incidentaly showed nonobstructing 1 cm left renal stone. Hyperdense cyst lower pole left kidney. Indistinct 2 cm region in the upper pole of the left kidney, similar to the study of 11 months ago, indeterminate. This could possibly represent a renal mass lesion. -Needs to f/u with PCPandurology in a month as recommended post discharge.  Generalized deconditioning -Patient will need home PT/OT. Palliative care evaluation appreciated.  Patient will need outpatient palliative care follow-up  Discharge Instructions  Discharge Instructions    Diet - low sodium heart healthy   Complete by:  As directed    Increase activity slowly   Complete by:  As directed      Allergies as of 02/01/2019   No Known Allergies     Medication List    STOP taking these medications   lisinopril-hydrochlorothiazide 20-25 MG tablet Commonly known as:  PRINZIDE,ZESTORETIC     TAKE these medications   acetaminophen 325 MG tablet Commonly known as:  TYLENOL Take 2 tablets (650 mg total) by mouth every 6 (six) hours as  needed for mild pain (or Fever >/= 101).   aspirin EC 81 MG tablet Take 81 mg  by mouth as needed (for discomfort or headaches).   folic acid 1 MG tablet Commonly known as:  FOLVITE Take 1 tablet (1 mg total) by mouth daily.   lisinopril 20 MG tablet Commonly known as:  PRINIVIL,ZESTRIL Take 1 tablet (20 mg total) by mouth daily for 30 days.   metoprolol tartrate 50 MG tablet Commonly known as:  LOPRESSOR Take 1 tablet (50 mg total) by mouth 2 (two) times daily.   multivitamin with minerals Tabs tablet Take 1 tablet by mouth daily.   ondansetron 4 MG tablet Commonly known as:  Zofran Take 1 tablet (4 mg total) by mouth every 8 (eight) hours as needed for nausea or vomiting.   thiamine 100 MG tablet Take 1 tablet (100 mg total) by mouth daily.   VITAMIN D3 PO Take 1 capsule by mouth 2 (two) times a week.      Follow-up Information    Josetta Huddle, MD. Schedule an appointment as soon as possible for a visit in 1 week(s).   Specialty:  Internal Medicine Contact information: [address] [place] [address] [phone]          No Known Allergies  Consultations:  Palliative care   Procedures/Studies: Ct Abdomen Pelvis W Contrast  Result Date: 01/23/2019 CLINICAL DATA:  Abdominal pain over the last several days. Intoxicated. EXAM: CT ABDOMEN AND PELVIS WITH CONTRAST TECHNIQUE: Multidetector CT imaging of the abdomen and pelvis was performed using the standard protocol following bolus administration of intravenous contrast. CONTRAST:  175mL OMNIPAQUE IOHEXOL 300 MG/ML  SOLN COMPARISON:  03/09/2018.  Multiple prior CTs.  Liver MRI 05/29/2011. FINDINGS: Lower chest: Atelectasis or patchy infiltrate in the right lower lobe. Hepatobiliary: Fatty liver. 2.7 cm low-density area in the upper left lobe, unchanged for many years and felt consistent with a hemangioma on previous studies. Previous cholecystectomy. Pancreas: Mild retroperitoneal edema surrounding the pancreas, probably indicating early pancreatitis. Spleen: Normal  Adrenals/Urinary Tract: Adrenal glands are normal. Kidneys show a 2.5 cm cyst at the lower pole on the left and an exophytic left lower pole lesion measuring a cm which has been present for a number of years but has become more dense, most consistent with a hyperdense cyst. Nonobstructing 1 cm stone in the midportion of the left kidney. 2 cm region in the upper pole of left kidney as seen previously, which is indeterminate for a complicated cyst versus a renal mass. This has not visibly enlarged since the previous study. MRI would be suggested for characterization. The bladder appears normal. Stomach/Bowel: Possible duodenitis. Edema in that region may simply be secondary to the adjacent pancreatitis. No other bowel finding of note. Diverticulosis without evidence of diverticulitis. Vascular/Lymphatic: Aortic atherosclerosis. Maximal diameter of the infrarenal aorta 2.5 cm. IVC negative. No adenopathy. Reproductive: Enlarged prostate. Other: Large left inguinal hernia containing only fat. Small amount of ascites secondary to the pancreatitis. Musculoskeletal: Chronic degenerative changes affect the spine. 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 IMPRESSION: Acute pancreatitis.  No evidence of pseudocyst or abscess. Right lower lobe atelectasis and or pneumonia. Fatty liver.  Chronic hemangioma left lobe. Nonobstructing 1 cm left renal stone. Hyperdense cyst lower pole left kidney. Indistinct 2 cm region in the upper pole of the left kidney, similar to the study of 11 months ago, indeterminate. This could possibly represent a renal mass lesion. Renal MRI would be suggested electively.  Aortic atherosclerosis. Left inguinal hernia containing only fat. 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 Enlarged prostate. Electronically Signed   By: Nelson Chimes M.D.   On: 01/23/2019 19:37   Dg Chest Portable 1 View  Result Date: 01/23/2019 CLINICAL DATA:  Tachypnea.  Abnormal right lower lobe at CT. EXAM: PORTABLE CHEST 1  VIEW COMPARISON:  CT earlier same day.  Radiography 02/14/2019 FINDINGS: Heart size is normal. Chronic aortic atherosclerosis. The upper lungs are clear. Mild density at the right lung base could be atelectasis or mild pneumonia. No visible effusion. Old healed rib fractures on the right. IMPRESSION: Abnormal density seen at chest CT at the right lung base is redemonstrated and could be due to volume loss or mild right base pneumonia. Electronically Signed   By: Nelson Chimes M.D.   On: 01/23/2019 20:23   Dg Swallowing Func-speech Pathology  Result Date: 01/30/2019 Objective Swallowing Evaluation: Type of Study: MBS-Modified Barium Swallow Study  Patient Details Name: BRAXDEN LOVERING MRN: [id number] Date of Birth: 1928-08-23 Today's Date: 01/30/2019 Time: SLP Start Time (ACUTE ONLY): 1440 -SLP Stop Time (ACUTE ONLY): 1500 SLP Time Calculation (min) (ACUTE ONLY): 20 min Past Medical History: Past Medical History: Diagnosis Date . Asymptomatic cholelithiasis  . Bladder tumor  . BPH (benign prostatic hypertrophy)  . Cancer ([place])   bladder . GERD (gastroesophageal reflux disease)  . Hearing loss  . HOH (hard of hearing)   no hearing aids . Hyperlipidemia  . Hypertension  . Kidney stones  . Mild left inguinal hernia  . Nocturia  . Urgency of urination  Past Surgical History: Past Surgical History: Procedure Laterality Date . CATARACT EXTRACTION W/ INTRAOCULAR LENS IMPLANT Left nov  2014 . CHOLECYSTECTOMY N/A 08/16/2015  Procedure: LAPAROSCOPIC CHOLECYSTECTOMY;  Surgeon: Rolm Bookbinder, MD;  Location: [facility];  Service: General;  Laterality: N/A; . TONSILLECTOMY   . TRANSURETHRAL RESECTION OF BLADDER TUMOR WITH GYRUS (TURBT-GYRUS) N/A 04/05/2014  Procedure: TRANSURETHRAL RESECTION OF BLADDER TUMOR WITH GYRUS  AND INTRAVESICO CHEMO;  Surgeon: Claybon Jabs, MD;  [facility];  Service: Urology;  Laterality: N/A; . TRANSURETHRAL RESECTION OF PROSTATE  [id number] HPI: 85 year old male with history of  hypertension, hyperlipidemia, GERD, stage III CKD, hard of hearing but refuses hearing aids, BPH, bladder cancer status post TURBT and intravesical chemotherapy in 2015, laparoscopic cholecystectomy in 2016, alcohol use presented on 01/23/2019 with abdominal pain, nausea and poor oral intake.  He was found to have acute pancreatitis on CT of the abdomen and pelvis.  He developed fever, dyspnea and wheezing with chest x-ray demonstrating right lower lobe infiltrate.  [place] testing was negative.  He also has been having delirium which is being treated medically.  COVID testing sent and returned negative  Subjective: Pt alert, confused Assessment / Plan / Recommendation CHL IP CLINICAL IMPRESSIONS 01/30/2019 Clinical Impression MBS completed with max encouragement needed for patient participation given grumpiness, confusion and poor hearing ability. WIth minimal trials of solids mastication was prolonged and inefficient with one minute needed for one bite of cracker (at bedside pt was more impulsive with incomplete mastication and rapid oral packing with frequent coughing). Pt observed to have trace silent penetration during consecutive swallows of thin liquids and trace silent aspiration of oral residuals spilling to pyriforms post swallow. Pt would not attempt a chin tuck despite cueing. A throat clear was successful in clearing aspirate, but pt needed excessive cueing to understand SLP instruction. Did not test nectar or complete esophageal sweep given pts  limited participation. Overall quantity of aspiration is minimal and moderate deficits and risk can be mitigated with precautions and careful assist with meals without significant diet modification. For now will change solids texture to fine chopped, but keep liquids thin with encouragement for a throat clear intermittently and discuss with pts son.  SLP Visit Diagnosis Dysphagia, oropharyngeal phase (R13.12) Attention and concentration deficit following -- Frontal  lobe and executive function deficit following -- Impact on safety and function Moderate aspiration risk   CHL IP TREATMENT RECOMMENDATION 01/30/2019 Treatment Recommendations Therapy as outlined in treatment plan below   Prognosis 01/30/2019 Prognosis for Safe Diet Advancement Fair Barriers to Reach Goals Cognitive deficits Barriers/Prognosis Comment -- CHL IP DIET RECOMMENDATION 01/30/2019 SLP Diet Recommendations Dysphagia 2 (Fine chop) solids;Thin liquid Liquid Administration via Cup;Straw Medication Administration Whole meds with puree Compensations Minimize environmental distractions;Slow rate;Small sips/bites;Clear throat intermittently Postural Changes Seated upright at 90 degrees   No flowsheet data found.  CHL IP FOLLOW UP RECOMMENDATIONS 01/30/2019 Follow up Recommendations 24 hour supervision/assistance   CHL IP FREQUENCY AND DURATION 01/30/2019 Speech Therapy Frequency (ACUTE ONLY) min 2x/week Treatment Duration 2 weeks      CHL IP ORAL PHASE 01/30/2019 Oral Phase Impaired Oral - Pudding Teaspoon -- Oral - Pudding Cup -- Oral - Honey Teaspoon -- Oral - Honey Cup -- Oral - Nectar Teaspoon -- Oral - Nectar Cup -- Oral - Nectar Straw -- Oral - Thin Teaspoon -- Oral - Thin Cup WFL Oral - Thin Straw WFL Oral - Puree WFL Oral - Mech Soft -- Oral - Regular Delayed oral transit;Decreased bolus cohesion;Impaired mastication Oral - Multi-Consistency -- Oral - Pill -- Oral Phase - Comment --  CHL IP PHARYNGEAL PHASE 01/30/2019 Pharyngeal Phase Impaired Pharyngeal- Pudding Teaspoon -- Pharyngeal -- Pharyngeal- Pudding Cup -- Pharyngeal -- Pharyngeal- Honey Teaspoon -- Pharyngeal -- Pharyngeal- Honey Cup -- Pharyngeal -- Pharyngeal- Nectar Teaspoon -- Pharyngeal -- Pharyngeal- Nectar Cup -- Pharyngeal -- Pharyngeal- Nectar Straw -- Pharyngeal -- Pharyngeal- Thin Teaspoon -- Pharyngeal -- Pharyngeal- Thin Cup Penetration/Aspiration during swallow;Reduced airway/laryngeal closure;Trace aspiration;Penetration/Apiration after  swallow Pharyngeal Material enters airway, remains ABOVE vocal cords and not ejected out;Material enters airway, passes BELOW cords without attempt by patient to eject out (silent aspiration) Pharyngeal- Thin Straw Penetration/Aspiration during swallow;Reduced airway/laryngeal closure;Trace aspiration Pharyngeal Material enters airway, CONTACTS cords and not ejected out Pharyngeal- Puree WFL Pharyngeal -- Pharyngeal- Mechanical Soft -- Pharyngeal -- Pharyngeal- Regular WFL Pharyngeal -- Pharyngeal- Multi-consistency -- Pharyngeal -- Pharyngeal- Pill -- Pharyngeal -- Pharyngeal Comment --  No flowsheet data found. Herbie Baltimore, MA CCC-SLP Acute Rehabilitation Services Pager [phone] Office [phone] Lynann Beaver 01/30/2019, 3:22 PM                  Subjective: Patient seen and examined at bedside.  He is sleepy, wakes up very slightly on calling his name.  Hardly answers any questions.  No overnight fever, vomiting or agitation reported by nursing staff.  He apparently was able to walk in the hallway yesterday as per nursing staff.  Discharge Exam: Vitals:   01/31/19 2108 02/01/19 0540  BP: (!) 183/92 (!) 174/77  Pulse: 74 66  Resp:    Temp: 97.7 F (36.5 C) 98.1 F (36.7 C)  SpO2: 97% 94%    General exam: Looks chronically ill.  No distress. elderly male lying in bed, looks "in his name.  Hardly answers any questions.  Hard of hearing Respiratory system: Bilateral decreased breath sounds at bases, some scattered crackles.  No wheezing Cardiovascular system: Rate controlled, S1-S2 heard Gastrointestinal system: Abdomen is nondistended, soft and nontender. Normal bowel sounds heard. Extremities: No cyanosis; trace edema   The results of significant diagnostics from this hospitalization (including imaging, microbiology, ancillary and laboratory) are listed below for reference.     Microbiology: Recent Results (from the past 240 hour(s))  Culture, blood (x 2)      Status: None   Collection Time: 01/23/19  9:10 PM  Result Value Ref Range Status   Specimen Description BLOOD RIGHT ANTECUBITAL  Final   Special Requests   Final    BOTTLES DRAWN AEROBIC AND ANAEROBIC Blood Culture results may not be optimal due to an inadequate volume of blood received in culture bottles   Culture   Final    NO GROWTH 5 DAYS Performed at [facility], [place] [address], [place], [place] [address]    Report Status 01/28/2019 FINAL  Final  Culture, blood (x 2)     Status: None   Collection Time: 01/23/19  9:26 PM  Result Value Ref Range Status   Specimen Description BLOOD BLOOD RIGHT FOREARM  Final   Special Requests   Final    BOTTLES DRAWN AEROBIC AND ANAEROBIC Blood Culture results may not be optimal due to an inadequate volume of blood received in culture bottles   Culture   Final    NO GROWTH 5 DAYS Performed at [facility], [place] [address] [place], [place] [address]    Report Status 01/28/2019 FINAL  Final  Novel Coronavirus, NAA (hospital order; send-out to ref lab)     Status: None   Collection Time: 01/23/19 10:55 PM  Result Value Ref Range Status   SARS-CoV-2, NAA NOT DETECTED NOT DETECTED Final    Comment: Performed at [facility]  Final    Comment: Performed at [facility], [place] [address], [place], [place] [address]  MRSA PCR Screening     Status: None   Collection Time: 01/24/19  6:57 AM  Result Value Ref Range Status   MRSA by PCR NEGATIVE NEGATIVE Final    Comment:        The GeneXpert MRSA Assay (FDA approved for NASAL specimens only), is one component of a comprehensive MRSA colonization surveillance program. It is not intended to diagnose MRSA infection nor to guide or monitor treatment for MRSA infections. Performed at [facility], [place] [address], [place],  [address]      Labs: BNP (last 3 results) No results for input(s): BNP in the last 8760 hours. Basic  Metabolic Panel: Recent Labs  Lab 01/28/19 0218 01/29/19 0236 01/30/19 0213 01/31/19 0317 02/01/19 0232  NA 134* 140 138 140 139  K 4.0 4.2 4.2 4.0 4.2  CL 105 103 104 102 100  CO2 22 30 24 29 28   GLUCOSE 103* 118* 152* 111* 120*  BUN 16 15 20 14 17   CREATININE 0.95 0.95 0.97 0.96 0.93  CALCIUM 8.5* 8.9 8.8* 8.9 9.3  MG 1.7  --  1.7  --  1.8   Liver Function Tests: Recent Labs  Lab 01/25/19 0857 01/28/19 0218 01/29/19 0236 01/30/19 0213 01/31/19 0317  AST 17 37 32 28 21  ALT QUANTITY NOT SUFFICIENT, UNABLE TO PERFORM TEST 17 20 22 21   ALKPHOS 89 92 84 87 81  BILITOT QUANTITY NOT SUFFICIENT, UNABLE TO PERFORM TEST 1.0 0.8 0.6 0.8  PROT 4.8* 5.4* 5.2* 5.6* 5.1*  ALBUMIN 2.6* 2.6* 2.5* 2.6* 2.5*   Recent Labs  Lab 01/25/19 0857  LIPASE 44   No results for input(s): AMMONIA in the last 168 hours. CBC: Recent Labs  Lab 01/26/19 0715 01/27/19 0143 01/28/19 0218 01/30/19 0213 02/01/19 0232  WBC 12.0* 12.8* 13.0* 13.9* 11.1*  NEUTROABS  --   --   --  10.3* 8.5*  HGB 14.5 14.9 15.3 15.6 14.5  HCT 46.8 45.8 46.6 47.9 46.9  MCV 97.7 95.6 94.9 95.8 97.1  PLT 129* 152 163 205 229   Cardiac Enzymes: No results for input(s): CKTOTAL, CKMB, CKMBINDEX, TROPONINI in the last 168 hours. BNP: Invalid input(s): POCBNP CBG: No results for input(s): GLUCAP in the last 168 hours. D-Dimer No results for input(s): DDIMER in the last 72 hours. Hgb A1c No results for input(s): HGBA1C in the last 72 hours. Lipid Profile No results for input(s): CHOL, HDL, LDLCALC, TRIG, CHOLHDL, LDLDIRECT in the last 72 hours. Thyroid function studies No results for input(s): TSH, T4TOTAL, T3FREE, THYROIDAB in the last 72 hours.  Invalid input(s): FREET3 Anemia work up No results for input(s): VITAMINB12, FOLATE, FERRITIN, TIBC, IRON, RETICCTPCT in the last 72 hours. Urinalysis    Component Value Date/Time   COLORURINE STRAW (A) 01/23/2019 2012   APPEARANCEUR CLEAR 01/23/2019 2012    LABSPEC >1.046 (H) 01/23/2019 2012   PHURINE 5.0 01/23/2019 2012   GLUCOSEU NEGATIVE 01/23/2019 2012   HGBUR SMALL (A) 01/23/2019 2012   BILIRUBINUR NEGATIVE 01/23/2019 2012   KETONESUR 5 (A) 01/23/2019 2012   PROTEINUR NEGATIVE 01/23/2019 2012   UROBILINOGEN 1.0 10/20/2012 0016   NITRITE NEGATIVE 01/23/2019 2012   LEUKOCYTESUR NEGATIVE 01/23/2019 2012   Sepsis Labs Invalid input(s): PROCALCITONIN,  WBC,  LACTICIDVEN Microbiology Recent Results (from the past 240 hour(s))  Culture, blood (x 2)     Status: None   Collection Time: 01/23/19  9:10 PM  Result Value Ref Range Status   Specimen Description BLOOD RIGHT ANTECUBITAL  Final   Special Requests   Final    BOTTLES DRAWN AEROBIC AND ANAEROBIC Blood Culture results may not be optimal due to an inadequate volume of blood received in culture bottles   Culture   Final    NO GROWTH 5 DAYS Performed at [facility], [place] [address], [place], [place] [address]    Report Status 01/28/2019 FINAL  Final  Culture, blood (x 2)     Status: None   Collection Time: 01/23/19  9:26 PM  Result Value Ref Range Status   Specimen Description BLOOD BLOOD RIGHT FOREARM  Final   Special Requests   Final    BOTTLES DRAWN AEROBIC AND ANAEROBIC Blood Culture results may not be optimal due to an inadequate volume of blood received in culture bottles   Culture   Final    NO GROWTH 5 DAYS Performed at [facility], [place] [address] [place], [place] [address]    Report Status 01/28/2019 FINAL  Final  Novel Coronavirus, NAA (hospital order; send-out to ref lab)     Status: None   Collection Time: 01/23/19 10:55 PM  Result Value Ref Range Status   SARS-CoV-2, NAA NOT DETECTED NOT DETECTED Final    Comment: Performed at [facility]  Final    Comment: Performed at [facility], [place] [address] [place], [place] [address]  MRSA PCR Screening     Status: None   Collection Time: 01/24/19  6:57 AM  Result Value Ref Range Status   MRSA by PCR NEGATIVE NEGATIVE Final    Comment:        The GeneXpert MRSA Assay (FDA approved for NASAL specimens only), is one component of a comprehensive MRSA colonization surveillance program. It is not intended to diagnose MRSA infection nor to guide or monitor treatment for MRSA infections. Performed at [facility], [place] [address], [place], [place] [address]      Time coordinating discharge: 35 minutes  SIGNED:   Aline August, MD  [facility] 02/01/2019, 8:36 AM

## 2019-02-03 DIAGNOSIS — D696 Thrombocytopenia, unspecified: Secondary | ICD-10-CM | POA: Diagnosis not present

## 2019-02-03 DIAGNOSIS — R413 Other amnesia: Secondary | ICD-10-CM | POA: Diagnosis not present

## 2019-02-03 DIAGNOSIS — G9341 Metabolic encephalopathy: Secondary | ICD-10-CM | POA: Diagnosis not present

## 2019-02-03 DIAGNOSIS — J189 Pneumonia, unspecified organism: Secondary | ICD-10-CM | POA: Diagnosis not present

## 2019-03-04 DIAGNOSIS — D696 Thrombocytopenia, unspecified: Secondary | ICD-10-CM | POA: Diagnosis not present

## 2019-03-04 DIAGNOSIS — R413 Other amnesia: Secondary | ICD-10-CM | POA: Diagnosis not present

## 2019-03-04 DIAGNOSIS — G9341 Metabolic encephalopathy: Secondary | ICD-10-CM | POA: Diagnosis not present

## 2019-03-04 DIAGNOSIS — J189 Pneumonia, unspecified organism: Secondary | ICD-10-CM | POA: Diagnosis not present

## 2019-04-06 DIAGNOSIS — R413 Other amnesia: Secondary | ICD-10-CM | POA: Diagnosis not present

## 2019-04-06 DIAGNOSIS — D696 Thrombocytopenia, unspecified: Secondary | ICD-10-CM | POA: Diagnosis not present

## 2019-04-06 DIAGNOSIS — G9341 Metabolic encephalopathy: Secondary | ICD-10-CM | POA: Diagnosis not present

## 2019-04-06 DIAGNOSIS — J189 Pneumonia, unspecified organism: Secondary | ICD-10-CM | POA: Diagnosis not present

## 2019-11-03 DIAGNOSIS — D696 Thrombocytopenia, unspecified: Secondary | ICD-10-CM | POA: Diagnosis not present

## 2019-11-03 DIAGNOSIS — R413 Other amnesia: Secondary | ICD-10-CM | POA: Diagnosis not present

## 2019-11-03 DIAGNOSIS — G9341 Metabolic encephalopathy: Secondary | ICD-10-CM | POA: Diagnosis not present

## 2019-11-03 DIAGNOSIS — I10 Essential (primary) hypertension: Secondary | ICD-10-CM | POA: Diagnosis not present

## 2020-05-24 ENCOUNTER — Emergency Department (HOSPITAL_COMMUNITY): Payer: Medicare Other

## 2020-05-24 ENCOUNTER — Other Ambulatory Visit: Payer: Self-pay

## 2020-05-24 ENCOUNTER — Inpatient Hospital Stay (HOSPITAL_COMMUNITY): Payer: Medicare Other

## 2020-05-24 ENCOUNTER — Encounter (HOSPITAL_COMMUNITY): Payer: Self-pay | Admitting: Emergency Medicine

## 2020-05-24 ENCOUNTER — Inpatient Hospital Stay (HOSPITAL_COMMUNITY)
Admission: EM | Admit: 2020-05-24 | Discharge: 2020-05-27 | DRG: 439 | Disposition: A | Discharge status: Home Health | Payer: Medicare Other | Attending: Internal Medicine | Admitting: Internal Medicine

## 2020-05-24 DIAGNOSIS — D72829 Elevated white blood cell count, unspecified: Secondary | ICD-10-CM | POA: Diagnosis not present

## 2020-05-24 DIAGNOSIS — R54 Age-related physical debility: Secondary | ICD-10-CM | POA: Diagnosis present

## 2020-05-24 DIAGNOSIS — Z79899 Other long term (current) drug therapy: Secondary | ICD-10-CM

## 2020-05-24 DIAGNOSIS — F0390 Unspecified dementia without behavioral disturbance: Secondary | ICD-10-CM | POA: Diagnosis present

## 2020-05-24 DIAGNOSIS — N179 Acute kidney failure, unspecified: Secondary | ICD-10-CM | POA: Diagnosis present

## 2020-05-24 DIAGNOSIS — H9190 Unspecified hearing loss, unspecified ear: Secondary | ICD-10-CM | POA: Diagnosis present

## 2020-05-24 DIAGNOSIS — K852 Alcohol induced acute pancreatitis without necrosis or infection: Secondary | ICD-10-CM | POA: Diagnosis not present

## 2020-05-24 DIAGNOSIS — J40 Bronchitis, not specified as acute or chronic: Secondary | ICD-10-CM | POA: Diagnosis not present

## 2020-05-24 DIAGNOSIS — J449 Chronic obstructive pulmonary disease, unspecified: Secondary | ICD-10-CM | POA: Diagnosis present

## 2020-05-24 DIAGNOSIS — N400 Enlarged prostate without lower urinary tract symptoms: Secondary | ICD-10-CM | POA: Diagnosis present

## 2020-05-24 DIAGNOSIS — E785 Hyperlipidemia, unspecified: Secondary | ICD-10-CM | POA: Diagnosis present

## 2020-05-24 DIAGNOSIS — Z20822 Contact with and (suspected) exposure to covid-19: Secondary | ICD-10-CM | POA: Diagnosis present

## 2020-05-24 DIAGNOSIS — Z9049 Acquired absence of other specified parts of digestive tract: Secondary | ICD-10-CM | POA: Diagnosis not present

## 2020-05-24 DIAGNOSIS — K4090 Unilateral inguinal hernia, without obstruction or gangrene, not specified as recurrent: Secondary | ICD-10-CM | POA: Diagnosis present

## 2020-05-24 DIAGNOSIS — R739 Hyperglycemia, unspecified: Secondary | ICD-10-CM | POA: Diagnosis present

## 2020-05-24 DIAGNOSIS — F1010 Alcohol abuse, uncomplicated: Secondary | ICD-10-CM | POA: Diagnosis present

## 2020-05-24 DIAGNOSIS — I10 Essential (primary) hypertension: Secondary | ICD-10-CM | POA: Diagnosis present

## 2020-05-24 DIAGNOSIS — K859 Acute pancreatitis without necrosis or infection, unspecified: Secondary | ICD-10-CM

## 2020-05-24 DIAGNOSIS — K920 Hematemesis: Secondary | ICD-10-CM | POA: Diagnosis present

## 2020-05-24 DIAGNOSIS — K769 Liver disease, unspecified: Secondary | ICD-10-CM | POA: Diagnosis present

## 2020-05-24 DIAGNOSIS — K219 Gastro-esophageal reflux disease without esophagitis: Secondary | ICD-10-CM | POA: Diagnosis present

## 2020-05-24 DIAGNOSIS — Z9221 Personal history of antineoplastic chemotherapy: Secondary | ICD-10-CM

## 2020-05-24 DIAGNOSIS — N200 Calculus of kidney: Secondary | ICD-10-CM | POA: Diagnosis present

## 2020-05-24 DIAGNOSIS — R519 Headache, unspecified: Secondary | ICD-10-CM | POA: Diagnosis present

## 2020-05-24 DIAGNOSIS — E875 Hyperkalemia: Secondary | ICD-10-CM | POA: Diagnosis present

## 2020-05-24 DIAGNOSIS — Z9079 Acquired absence of other genital organ(s): Secondary | ICD-10-CM

## 2020-05-24 DIAGNOSIS — Z806 Family history of leukemia: Secondary | ICD-10-CM

## 2020-05-24 DIAGNOSIS — D6959 Other secondary thrombocytopenia: Secondary | ICD-10-CM | POA: Diagnosis present

## 2020-05-24 DIAGNOSIS — N281 Cyst of kidney, acquired: Secondary | ICD-10-CM | POA: Diagnosis present

## 2020-05-24 DIAGNOSIS — F1020 Alcohol dependence, uncomplicated: Secondary | ICD-10-CM | POA: Diagnosis present

## 2020-05-24 DIAGNOSIS — Z8551 Personal history of malignant neoplasm of bladder: Secondary | ICD-10-CM

## 2020-05-24 DIAGNOSIS — R0602 Shortness of breath: Secondary | ICD-10-CM

## 2020-05-24 DIAGNOSIS — Z7982 Long term (current) use of aspirin: Secondary | ICD-10-CM | POA: Diagnosis not present

## 2020-05-24 LAB — COMPREHENSIVE METABOLIC PANEL
ALT: 49 U/L — ABNORMAL HIGH (ref 0–44)
AST: 89 U/L — ABNORMAL HIGH (ref 15–41)
Albumin: 3.9 g/dL (ref 3.5–5.0)
Alkaline Phosphatase: 175 U/L — ABNORMAL HIGH (ref 38–126)
Anion gap: 17 — ABNORMAL HIGH (ref 5–15)
BUN: 28 mg/dL — ABNORMAL HIGH (ref 8–23)
CO2: 25 mmol/L (ref 22–32)
Calcium: 9.9 mg/dL (ref 8.9–10.3)
Chloride: 93 mmol/L — ABNORMAL LOW (ref 98–111)
Creatinine, Ser: 2 mg/dL — ABNORMAL HIGH (ref 0.61–1.24)
GFR calc Af Amer: 33 mL/min — ABNORMAL LOW (ref >60)
GFR calc non Af Amer: 29 mL/min — ABNORMAL LOW (ref >60)
Glucose, Bld: 225 mg/dL — ABNORMAL HIGH (ref 70–99)
Potassium: 4.6 mmol/L (ref 3.5–5.1)
Sodium: 135 mmol/L (ref 135–145)
Total Bilirubin: 3 mg/dL — ABNORMAL HIGH (ref 0.3–1.2)
Total Protein: 6.7 g/dL (ref 6.5–8.1)

## 2020-05-24 LAB — LIPID PANEL
Cholesterol: 150 mg/dL (ref 0–200)
HDL: 48 mg/dL (ref >40)
LDL Cholesterol: 83 mg/dL (ref 0–99)
Total CHOL/HDL Ratio: 3.1 RATIO
Triglycerides: 94 mg/dL (ref <150)
VLDL: 19 mg/dL (ref 0–40)

## 2020-05-24 LAB — CBC WITH DIFFERENTIAL/PLATELET
Abs Immature Granulocytes: 0.1 10*3/uL — ABNORMAL HIGH (ref 0.00–0.07)
Basophils Absolute: 0 10*3/uL (ref 0.0–0.1)
Basophils Relative: 0 %
Eosinophils Absolute: 0 10*3/uL (ref 0.0–0.5)
Eosinophils Relative: 0 %
HCT: 50.2 % (ref 39.0–52.0)
Hemoglobin: 16.6 g/dL (ref 13.0–17.0)
Immature Granulocytes: 1 %
Lymphocytes Relative: 3 %
Lymphs Abs: 0.4 10*3/uL — ABNORMAL LOW (ref 0.7–4.0)
MCH: 32.2 pg (ref 26.0–34.0)
MCHC: 33.1 g/dL (ref 30.0–36.0)
MCV: 97.3 fL (ref 80.0–100.0)
Monocytes Absolute: 0.9 10*3/uL (ref 0.1–1.0)
Monocytes Relative: 7 %
Neutro Abs: 12.5 10*3/uL — ABNORMAL HIGH (ref 1.7–7.7)
Neutrophils Relative %: 89 %
Platelets: 209 10*3/uL (ref 150–400)
RBC: 5.16 MIL/uL (ref 4.22–5.81)
RDW: 12.9 % (ref 11.5–15.5)
WBC: 14 10*3/uL — ABNORMAL HIGH (ref 4.0–10.5)
nRBC: 0 % (ref 0.0–0.2)

## 2020-05-24 LAB — SAMPLE TO BLOOD BANK

## 2020-05-24 LAB — HEMOGLOBIN AND HEMATOCRIT, BLOOD
HCT: 48.1 % (ref 39.0–52.0)
Hemoglobin: 15.7 g/dL (ref 13.0–17.0)

## 2020-05-24 LAB — TYPE AND SCREEN
ABO/RH(D): A POS
Antibody Screen: NEGATIVE

## 2020-05-24 LAB — LIPASE, BLOOD: Lipase: 1244 U/L — ABNORMAL HIGH (ref 11–51)

## 2020-05-24 LAB — SARS CORONAVIRUS 2 BY RT PCR (HOSPITAL ORDER, PERFORMED IN ~~LOC~~ HOSPITAL LAB): SARS Coronavirus 2: NEGATIVE

## 2020-05-24 LAB — CBG MONITORING, ED
Glucose-Capillary: 188 mg/dL — ABNORMAL HIGH (ref 70–99)
Glucose-Capillary: 274 mg/dL — ABNORMAL HIGH (ref 70–99)

## 2020-05-24 LAB — PROTIME-INR
INR: 1.1 (ref 0.8–1.2)
Prothrombin Time: 13.6 seconds (ref 11.4–15.2)

## 2020-05-24 LAB — ABO/RH: ABO/RH(D): A POS

## 2020-05-24 LAB — HEMOGLOBIN A1C
Hgb A1c MFr Bld: 5.9 % — ABNORMAL HIGH (ref 4.8–5.6)
Mean Plasma Glucose: 122.63 mg/dL

## 2020-05-24 MED ORDER — THIAMINE HCL 100 MG/ML IJ SOLN
100.0000 mg | Freq: Every day | INTRAMUSCULAR | Status: DC
  Administered 2020-05-24: 100 mg via INTRAVENOUS
  Filled 2020-05-24 (×2): qty 2

## 2020-05-24 MED ORDER — PREDNISONE 20 MG PO TABS
40.0000 mg | ORAL_TABLET | Freq: Every day | ORAL | Status: DC
  Administered 2020-05-25 – 2020-05-27 (×3): 40 mg via ORAL
  Filled 2020-05-24 (×3): qty 2

## 2020-05-24 MED ORDER — IPRATROPIUM-ALBUTEROL 0.5-2.5 (3) MG/3ML IN SOLN
3.0000 mL | Freq: Four times a day (QID) | RESPIRATORY_TRACT | Status: DC
  Administered 2020-05-24 (×2): 3 mL via RESPIRATORY_TRACT
  Filled 2020-05-24 (×2): qty 3

## 2020-05-24 MED ORDER — INSULIN ASPART 100 UNIT/ML ~~LOC~~ SOLN
0.0000 [IU] | Freq: Every day | SUBCUTANEOUS | Status: DC
  Administered 2020-05-24: 3 [IU] via SUBCUTANEOUS

## 2020-05-24 MED ORDER — METHYLPREDNISOLONE SODIUM SUCC 125 MG IJ SOLR
125.0000 mg | Freq: Once | INTRAMUSCULAR | Status: AC
  Administered 2020-05-24: 125 mg via INTRAVENOUS
  Filled 2020-05-24: qty 2

## 2020-05-24 MED ORDER — ACETAMINOPHEN 650 MG RE SUPP: 650.0000 mg | Freq: Four times a day (QID) | RECTAL | Status: DC | PRN

## 2020-05-24 MED ORDER — ADULT MULTIVITAMIN W/MINERALS CH
1.0000 | ORAL_TABLET | Freq: Every day | ORAL | Status: DC
  Administered 2020-05-24 – 2020-05-27 (×4): 1 via ORAL
  Filled 2020-05-24 (×4): qty 1

## 2020-05-24 MED ORDER — INSULIN ASPART 100 UNIT/ML ~~LOC~~ SOLN
0.0000 [IU] | Freq: Three times a day (TID) | SUBCUTANEOUS | Status: DC
  Administered 2020-05-24 – 2020-05-26 (×7): 1 [IU] via SUBCUTANEOUS
  Administered 2020-05-27: 3 [IU] via SUBCUTANEOUS

## 2020-05-24 MED ORDER — ACETAMINOPHEN 325 MG PO TABS: 650.0000 mg | ORAL_TABLET | Freq: Four times a day (QID) | ORAL | Status: DC | PRN

## 2020-05-24 MED ORDER — THIAMINE HCL 100 MG PO TABS
100.0000 mg | ORAL_TABLET | Freq: Every day | ORAL | Status: DC
  Administered 2020-05-25 – 2020-05-27 (×3): 100 mg via ORAL
  Filled 2020-05-24 (×3): qty 1

## 2020-05-24 MED ORDER — LACTATED RINGERS IV SOLN
INTRAVENOUS | Status: DC
  Administered 2020-05-26: 100 mL via INTRAVENOUS

## 2020-05-24 MED ORDER — SODIUM CHLORIDE 0.9 % IV BOLUS
1000.0000 mL | Freq: Once | INTRAVENOUS | Status: AC
  Administered 2020-05-24: 1000 mL via INTRAVENOUS

## 2020-05-24 MED ORDER — MORPHINE SULFATE (PF) 2 MG/ML IV SOLN: 1.0000 mg | INTRAVENOUS | Status: DC | PRN

## 2020-05-24 MED ORDER — SODIUM CHLORIDE 0.9% FLUSH
3.0000 mL | Freq: Two times a day (BID) | INTRAVENOUS | Status: DC
  Administered 2020-05-24 – 2020-05-27 (×7): 3 mL via INTRAVENOUS

## 2020-05-24 MED ORDER — PANTOPRAZOLE SODIUM 40 MG IV SOLR
40.0000 mg | Freq: Two times a day (BID) | INTRAVENOUS | Status: DC
  Administered 2020-05-24 – 2020-05-26 (×5): 40 mg via INTRAVENOUS
  Filled 2020-05-24 (×5): qty 40

## 2020-05-24 MED ORDER — LORAZEPAM 2 MG/ML IJ SOLN
0.0000 mg | Freq: Four times a day (QID) | INTRAMUSCULAR | Status: AC
  Administered 2020-05-24: 1 mg via INTRAVENOUS
  Administered 2020-05-25 (×2): 2 mg via INTRAVENOUS
  Filled 2020-05-24 (×3): qty 1

## 2020-05-24 MED ORDER — IPRATROPIUM-ALBUTEROL 0.5-2.5 (3) MG/3ML IN SOLN
3.0000 mL | RESPIRATORY_TRACT | Status: AC
  Administered 2020-05-24: 3 mL via RESPIRATORY_TRACT
  Filled 2020-05-24: qty 3

## 2020-05-24 MED ORDER — ONDANSETRON HCL 4 MG PO TABS: 4.0000 mg | ORAL_TABLET | Freq: Four times a day (QID) | ORAL | Status: DC | PRN

## 2020-05-24 MED ORDER — LORAZEPAM 2 MG/ML IJ SOLN
0.0000 mg | Freq: Two times a day (BID) | INTRAMUSCULAR | Status: DC
  Administered 2020-05-27: 1 mg via INTRAVENOUS
  Filled 2020-05-24: qty 1

## 2020-05-24 MED ORDER — FOLIC ACID 1 MG PO TABS
1.0000 mg | ORAL_TABLET | Freq: Every day | ORAL | Status: DC
  Administered 2020-05-24 – 2020-05-27 (×4): 1 mg via ORAL
  Filled 2020-05-24 (×4): qty 1

## 2020-05-24 MED ORDER — ONDANSETRON HCL 4 MG/2ML IJ SOLN: 4.0000 mg | Freq: Four times a day (QID) | INTRAMUSCULAR | Status: DC | PRN

## 2020-05-24 NOTE — ED Notes (Signed)
This rn attempted iv access twice without success, another RN to try

## 2020-05-24 NOTE — ED Triage Notes (Addendum)
Patient arrived with EMS from home reports dark brown  emesis x2 this morning , no diarrhea , denies abdominal pain or fever . He is not taking anticoagulant medication .

## 2020-05-24 NOTE — ED Notes (Signed)
Johntavious Francom [phone] wants an update on the pt

## 2020-05-24 NOTE — ED Provider Notes (Signed)
[name] Provider Note   CSN: [id number] Arrival date & time: 05/24/20  0631     History Chief Complaint  Patient presents with  . Hematemesis    Ryan Tyler is a 86 y.o. male w/ hx of pancreatitis, cholecystectomy, presenting to ED with hematemesis.  The patient has some dementia and is hard of hearing.  He tells me he's had abdominal pain for 3 days in his epigastrum, that he cannot describe further.  He does feel it is similar to his pancreatitis that he was hospitalized for last year.  He reports he drinks "one beer per day," his last beer was yesterday, and denies hx of etoh withdrawal.    His son provides additional history and reports the patient had a very large amount of hematemesis this morning, "Dark coffee ground emesis everywhere."  The patient is NOT on AC or blood thinners.  The patient has not had covid vaccines.  Record review shows last CT scan on 01/23/19 noting cholecystectomy, possible duodenitis, diverticulosis, and acute pancreatitis without abscess or pseudocyst.  HPI     Past Medical History:  Diagnosis Date  . Asymptomatic cholelithiasis   . Bladder tumor   . BPH (benign prostatic hypertrophy)   . Cancer ([place])    bladder  . GERD (gastroesophageal reflux disease)   . Hearing loss   . HOH (hard of hearing)    no hearing aids  . Hyperlipidemia   . Hypertension   . Kidney stones   . Mild left inguinal hernia   . Nocturia   . Urgency of urination     Patient Active Problem List   Diagnosis Date Noted  . Metabolic acidemia 33/82/5053  . CKD (chronic kidney disease), stage III 01/23/2019  . Leukocytosis 01/23/2019  . SIRS (systemic inflammatory response syndrome) ([place]) 01/23/2019  . Acute pancreatitis 01/23/2019  . Lobar pneumonia ([place]) 01/23/2019  . Sepsis ([place]) 01/23/2019  . Hypertension   . Ambulatory dysfunction 03/09/2018  . Renal mass 03/09/2018  . Closed fracture of iliac crest, right, initial  encounter ([place]) 03/09/2018  . Fall at home, initial encounter 03/09/2018  . Right hip pain 03/09/2018  . Fall 03/09/2018  . S/P laparoscopic cholecystectomy 08/16/2015    Past Surgical History:  Procedure Laterality Date  . CATARACT EXTRACTION W/ INTRAOCULAR LENS IMPLANT Left nov  2014  . CHOLECYSTECTOMY N/A 08/16/2015   Procedure: LAPAROSCOPIC CHOLECYSTECTOMY;  Surgeon: Rolm Bookbinder, MD;  Location: [facility] [place];  Service: General;  Laterality: N/A;  . TONSILLECTOMY    . TRANSURETHRAL RESECTION OF BLADDER TUMOR WITH GYRUS (TURBT-GYRUS) N/A 04/05/2014   Procedure: TRANSURETHRAL RESECTION OF BLADDER TUMOR WITH GYRUS  AND INTRAVESICO CHEMO;  Surgeon: Claybon Jabs, MD;  Location: [facility];  Service: Urology;  Laterality: N/A;  . TRANSURETHRAL RESECTION OF PROSTATE  1995       Family History  Problem Relation Age of Onset  . Cancer Mother   . Leukemia Other     Social History   Tobacco Use  . Smoking status: Never Smoker  . Smokeless tobacco: Former Systems developer    Types: Chew  Substance Use Topics  . Alcohol use: Yes    Comment: 1 pint liquor most days.    . Drug use: No    Home Medications Prior to Admission medications   Medication Sig Start Date End Date Taking? Authorizing Provider  acetaminophen (TYLENOL) 325 MG tablet Take 2 tablets (650 mg total) by mouth every 6 (six) hours  as needed for mild pain (or Fever >/= 101). 03/11/18   Geradine Girt, DO  aspirin EC 81 MG tablet Take 81 mg by mouth as needed (for discomfort or headaches).     [provider]  Cholecalciferol (VITAMIN D3 PO) Take 1 capsule by mouth 2 (two) times a week.     [provider]  folic acid (FOLVITE) 1 MG tablet Take 1 tablet (1 mg total) by mouth daily. 02/01/19   Aline August, MD  lisinopril (PRINIVIL,ZESTRIL) 20 MG tablet Take 1 tablet (20 mg total) by mouth daily for 30 days. 02/01/19 03/03/19  Aline August, MD  lisinopril-hydrochlorothiazide (ZESTORETIC) 20-25 MG  tablet Take 1 tablet by mouth daily. 02/09/20   [provider]  metoprolol tartrate (LOPRESSOR) 50 MG tablet Take 1 tablet (50 mg total) by mouth 2 (two) times daily. 02/01/19   Aline August, MD  Multiple Vitamin (MULTIVITAMIN WITH MINERALS) TABS tablet Take 1 tablet by mouth daily. 02/01/19   Aline August, MD  thiamine 100 MG tablet Take 1 tablet (100 mg total) by mouth daily. 02/01/19   Aline August, MD    Allergies    Patient has no known allergies.  Review of Systems   Review of Systems  Constitutional: Negative for chills and fever.  HENT: Negative for ear pain and sore throat.   Eyes: Negative for photophobia and visual disturbance.  Respiratory: Negative for cough and shortness of breath.   Cardiovascular: Negative for chest pain and palpitations.  Gastrointestinal: Positive for abdominal pain, nausea and vomiting. Negative for blood in stool, constipation and diarrhea.  Genitourinary: Negative for dysuria and hematuria.  Musculoskeletal: Negative for arthralgias and back pain.  Skin: Negative for rash and wound.  Neurological: Negative for syncope, light-headedness and headaches.  Psychiatric/Behavioral: Negative for agitation and confusion.  All other systems reviewed and are negative.   Physical Exam Updated Vital Signs BP (!) 161/97   Pulse 94   Temp 98 F (36.7 C) (Oral)   Resp 19   Ht 6' (1.829 m)   Wt 77.1 kg   SpO2 96%   BMI 23.06 kg/m   Physical Exam Vitals and nursing note reviewed.  Constitutional:      Appearance: He is well-developed.     Comments: Hard of hearing  HENT:     Head: Normocephalic and atraumatic.  Eyes:     Conjunctiva/sclera: Conjunctivae normal.  Cardiovascular:     Rate and Rhythm: Normal rate and regular rhythm.     Pulses: Normal pulses.  Pulmonary:     Effort: Pulmonary effort is normal. No respiratory distress.     Breath sounds: Normal breath sounds. No wheezing.  Abdominal:     Tenderness: There is abdominal  tenderness in the epigastric area. There is no guarding. Negative signs include Murphy's sign and McBurney's sign.  Musculoskeletal:     Cervical back: Neck supple.  Skin:    General: Skin is warm and dry.  Neurological:     General: No focal deficit present.     Mental Status: He is alert. Mental status is at baseline.     ED Results / Procedures / Treatments   Labs (all labs ordered are listed, but only abnormal results are displayed) Labs Reviewed  CBC WITH DIFFERENTIAL/PLATELET - Abnormal; Notable for the following components:      Result Value   WBC 14.0 (*)    Neutro Abs 12.5 (*)    Lymphs Abs 0.4 (*)    Abs Immature Granulocytes  0.10 (*)    All other components within normal limits  COMPREHENSIVE METABOLIC PANEL - Abnormal; Notable for the following components:   Chloride 93 (*)    Glucose, Bld 225 (*)    BUN 28 (*)    Creatinine, Ser 2.00 (*)    AST 89 (*)    ALT 49 (*)    Alkaline Phosphatase 175 (*)    Total Bilirubin 3.0 (*)    GFR calc non Af Amer 29 (*)    GFR calc Af Amer 33 (*)    Anion gap 17 (*)    All other components within normal limits  LIPASE, BLOOD - Abnormal; Notable for the following components:   Lipase 1,244 (*)    All other components within normal limits  SARS CORONAVIRUS 2 BY RT PCR (HOSPITAL ORDER, [place])  PROTIME-INR  LIPID PANEL  HEMOGLOBIN A1C  SAMPLE TO BLOOD BANK    EKG None  Radiology CT ABDOMEN PELVIS WO CONTRAST  Result Date: 05/24/2020 CLINICAL DATA:  Abdominal pain EXAM: CT ABDOMEN AND PELVIS WITHOUT CONTRAST TECHNIQUE: Multidetector CT imaging of the abdomen and pelvis was performed following the standard protocol without IV contrast. COMPARISON:  01/23/2019 FINDINGS: Lower chest: Mild right basilar atelectasis is noted. Hepatobiliary: No focal liver abnormality is seen. Status post cholecystectomy. No biliary dilatation. Pancreas: Pancreas is well visualized and demonstrates some very mild  peripancreatic inflammatory change similar to that seen on the prior exam. No pseudocyst is noted. Some compensatory thickening of the duodenum is noted as well. Spleen: Normal in size without focal abnormality. Adrenals/Urinary Tract: Adrenal glands are within normal limits. Kidneys demonstrate nonobstructing left renal stone measuring 9 mm. This is roughly stable from the prior study. Renal cystic changes noted on the left stable from the prior exam. One of these is hyperdense likely related to hemorrhage but stable. The bladder is well distended. Stomach/Bowel: No obstructive or inflammatory changes of the colon are seen. The appendix is within normal limits. No small bowel or gastric abnormality is seen. Vascular/Lymphatic: Atherosclerotic calcifications of the aorta are noted. No aneurysmal dilatation is seen. No lymphadenopathy is noted. Reproductive: Prostate is prominent indenting upon the inferior aspect of the bladder but stable from the prior exam. Other: Large fat containing left inguinal hernia is noted. No significant ascites is seen. Musculoskeletal: Degenerative changes of the lumbar spine are noted. Changes of prior right iliac fracture are seen. Changes consistent with Paget's disease are noted as well. IMPRESSION: Changes consistent with acute pancreatitis. No pseudocyst formation is noted. Nonobstructing left renal stone. Stable left renal cysts. Large fat containing inguinal hernia. Chronic changes as described above Electronically Signed   By: Inez Catalina M.D.   On: 05/24/2020 12:27   DG CHEST PORT 1 VIEW  Result Date: 05/24/2020 CLINICAL DATA:  Shortness of breath, vomiting EXAM: PORTABLE CHEST 1 VIEW COMPARISON:  01/23/2019 FINDINGS: Stable cardiomediastinal contours. Atherosclerotic calcification of the aortic knob. Low lung volumes. Elevation of the right hemidiaphragm. Mild streaky bibasilar opacities, favor atelectasis. No pleural effusion. No pneumothorax. IMPRESSION: Low lung  volumes with streaky bibasilar opacities, favor atelectasis. Electronically Signed   By: Davina Poke D.O.   On: 05/24/2020 16:29    Procedures Procedures (including critical care time)  Medications Ordered in ED Medications  lactated ringers infusion ( Intravenous New Bag/Given 05/24/20 1450)  pantoprazole (PROTONIX) injection 40 mg (40 mg Intravenous Given 05/24/20 1443)  sodium chloride flush (NS) 0.9 % injection 3 mL (3 mLs Intravenous  Given 05/24/20 1444)  acetaminophen (TYLENOL) tablet 650 mg (has no administration in time range)    Or  acetaminophen (TYLENOL) suppository 650 mg (has no administration in time range)  ondansetron (ZOFRAN) tablet 4 mg (has no administration in time range)    Or  ondansetron (ZOFRAN) injection 4 mg (has no administration in time range)  thiamine tablet 100 mg ( Oral See Alternative 05/24/20 1443)    Or  thiamine (B-1) injection 100 mg (100 mg Intravenous Given 12/24/68 1779)  folic acid (FOLVITE) tablet 1 mg (1 mg Oral Given 05/24/20 1604)  multivitamin with minerals tablet 1 tablet (1 tablet Oral Given 05/24/20 1604)  LORazepam (ATIVAN) injection 0-4 mg (1 mg Intravenous Given 05/24/20 1443)    Followed by  LORazepam (ATIVAN) injection 0-4 mg (has no administration in time range)  methylPREDNISolone sodium succinate (SOLU-MEDROL) 125 mg/2 mL injection 125 mg (has no administration in time range)  insulin aspart (novoLOG) injection 0-6 Units (has no administration in time range)  insulin aspart (novoLOG) injection 0-5 Units (has no administration in time range)  sodium chloride 0.9 % bolus 1,000 mL (0 mLs Intravenous Stopped 05/24/20 1605)  ipratropium-albuterol (DUONEB) 0.5-2.5 (3) MG/3ML nebulizer solution 3 mL (3 mLs Nebulization Given 05/24/20 1626)    ED Course  I have reviewed the triage vital signs and the nursing notes.  Pertinent labs & imaging results that were available during my care of the patient were reviewed by me and considered in my medical  decision making (see chart for details).  86 yo male w/ hx of pancreatitis presenting to ED with epigastric pain for about 3 days and a reported episode of hematemesis this morning, witnessed by his son.  On arrival in the ED the patient's vitals are stable and he is clinically well appearing.  He did have some focal epigastric ttp on exam.    Ddx includes duodenitis vs gastric ulceration vs pancreatitis vs reflux vs GI perforation vs other  We initiated IV fluids on arrival, and I ordered a stat CT scan of the abdomen to evaluate for abdominal surgical emergency.  After discussion with radiologist, he felt a CT without contrast (to spare the patient's poor kidney function) would be sufficient to evaluate for GI perforation, duodenitis and pancreatitis, which were my primary concerns.  The CT scan subsequently demonstrated pancreatitis without evidence of abscess.  Patient has noted cholecystectomy.  It is unclear what the pancreatitis trigger is but was felt to be attributed to alcohol consumption in the past, and he continues to drink beer at home.  Labs subsequently showed lipase significantly elevated at 1244, glucose 225, BUN 28, Cr 2.0 (last known Cr 1 year ago at 0.93), AST 89, ALT 49, Alk Phos 173, TBili 3.0, Anion gap 17.  WBC 14.0.  Hemoglobin 16.6.  Suspect some level of hemoconcentration with BUN: Cr ratio elevated.    Overall the patient was clinically well appearing with no clear bacterial source suggestive of sepsis or cholangitis.  LR infusion was ordered for pancreatitis.  Pain medications were offered but deferred by the patient and his son.  The patient denied significant pain or distress.  He had no signs of acute alcohol withdrawal in the ED.  I suspect his home hematemesis may have been a mallory-weiss tear from vomiting, or else a bleeding peptic or duodenal ulcer, but did not feel he required emergent GI endoscopy or intervention in the ED.  I deferred this consult and  evaluation to the hospital team.  I felt he was stable for a medical admission to the hospitalist and was admitted to Dr Tamala Julian around 1340.  I personally reviewed his prior medical history and obtained additional history from his son at bedside, as noted above.   Clinical Course as of May 25 1639  Tue May 24, 2020  1209 Lipase(!): 1,244 [MT]  1240 IMPRESSION: Changes consistent with acute pancreatitis. No pseudocyst formation is noted.  Nonobstructing left renal stone.  Stable left renal cysts.  Large fat containing inguinal hernia.  Chronic changes as described above   [MT]  1305 Pt still feeling okay and vitals remain stable without evidence of hypotension, tachycardia, or volume loss from large internal bleeding.  Advised hospitalizaiton for IVF for pancreatitis and to trend lipase level.     [MT]  1340 Signed out to hospitalist Dr Tamala Julian   [MT]    Clinical Course User Index [MT] Langston Masker Carola Rhine, MD   Final Clinical Impression(s) / ED Diagnoses Final diagnoses:  Acute pancreatitis without infection or necrosis, unspecified pancreatitis type  Hematemesis without nausea    Rx / DC Orders ED Discharge Orders    None       Wyvonnia Dusky, MD 05/24/20 1640

## 2020-05-24 NOTE — H&P (Addendum)
History and Physical    Ryan Tyler ZWC:[id number] DOB: 1927/11/08 DOA: 05/24/2020  Referring MD/NP/PA: Octaviano Glow, MD PCP: Josetta Huddle, MD  Patient coming from: Home via EMS  Chief Complaint: Vomiting blood  I have personally briefly reviewed patient's old medical records in [place]   HPI: Ryan Tyler is a 86 y.o. male with medical history significant of HTN, HLD, bladder cancer s/p of TURBT-GYRS, BPH, s/p cholecystectomy, nephrolithiasis, and GERD presents after reportedly vomiting up coffee-ground emesis.  History is obtained from review of records with the patient is not a great historian.  He reports that he has had epigastric abdominal pain, but is unable to tell me if it radiated or was constant.  He admits to drinking beer on a daily basis, but does not seem to be able to quantify.  He reports that he lives at home with his son.  Patient does not take any blood thinners.  Last admitted into the hospital in April for pancreatitis thought secondary to alcohol use.  ED Course: Upon admission to the emergency department patient was seen to be afebrile, respirations 29-34, blood pressure 159/83-170 2/89, and O2 saturations maintained on room air.  Labs significant for WBC 14, BUN 28, creatinine 2, glucose 225, AST 89,  ALT 49, and lipase 1244.  Patient was given 1 L of IV fluids.  TRH called to admit.     Review of Systems  Unable to perform ROS: Dementia  Constitutional: Negative for fever.  Respiratory: Positive for wheezing.   Cardiovascular: Negative for chest pain.  Gastrointestinal: Positive for abdominal pain, nausea and vomiting.    Past Medical History:  Diagnosis Date  . Asymptomatic cholelithiasis   . Bladder tumor   . BPH (benign prostatic hypertrophy)   . Cancer ([place])    bladder  . GERD (gastroesophageal reflux disease)   . Hearing loss   . HOH (hard of hearing)    no hearing aids  . Hyperlipidemia   . Hypertension   . Kidney stones   . Mild  left inguinal hernia   . Nocturia   . Urgency of urination     Past Surgical History:  Procedure Laterality Date  . CATARACT EXTRACTION W/ INTRAOCULAR LENS IMPLANT Left nov  2014  . CHOLECYSTECTOMY N/A 08/16/2015   Procedure: LAPAROSCOPIC CHOLECYSTECTOMY;  Surgeon: Rolm Bookbinder, MD;  Location: [facility];  Service: General;  Laterality: N/A;  . TONSILLECTOMY    . TRANSURETHRAL RESECTION OF BLADDER TUMOR WITH GYRUS (TURBT-GYRUS) N/A 04/05/2014   Procedure: TRANSURETHRAL RESECTION OF BLADDER TUMOR WITH GYRUS  AND INTRAVESICO CHEMO;  Surgeon: Claybon Jabs, MD;  Location[facility];  Service: Urology;  Laterality: N/A;  . [place]     reports that he has never smoked. He quit smokeless tobacco use about 11 years ago.  His smokeless tobacco use included chew. He reports current alcohol use. He reports that he does not use drugs.  No Known Allergies  Family History  Problem Relation Age of Onset  . Cancer Mother   . Leukemia Other     Prior to Admission medications   Medication Sig Start Date End Date Taking? Authorizing Provider  acetaminophen (TYLENOL) 325 MG tablet Take 2 tablets (650 mg total) by mouth every 6 (six) hours as needed for mild pain (or Fever >/= 101). 03/11/18   Geradine Girt, DO  aspirin EC 81 MG tablet Take 81 mg by mouth as needed (for discomfort  or headaches).     [provider]  Cholecalciferol (VITAMIN D3 PO) Take 1 capsule by mouth 2 (two) times a week.     [provider]  folic acid (FOLVITE) 1 MG tablet Take 1 tablet (1 mg total) by mouth daily. 02/01/19   Aline August, MD  lisinopril (PRINIVIL,ZESTRIL) 20 MG tablet Take 1 tablet (20 mg total) by mouth daily for 30 days. 02/01/19 03/03/19  Aline August, MD  metoprolol tartrate (LOPRESSOR) 50 MG tablet Take 1 tablet (50 mg total) by mouth 2 (two) times daily. 02/01/19   Aline August, MD  Multiple Vitamin (MULTIVITAMIN WITH MINERALS) TABS  tablet Take 1 tablet by mouth daily. 02/01/19   Aline August, MD  thiamine 100 MG tablet Take 1 tablet (100 mg total) by mouth daily. 02/01/19   Aline August, MD    Physical Exam:  Constitutional: Elderly male who appears to be in discomfort Vitals:   05/24/20 0639 05/24/20 0643 05/24/20 0913 05/24/20 0943  BP:  (!) 157/96 (!) 159/83 (!) 172/89  Pulse:  94 86 84  Resp:  17 (!) 29 (!) 34  Temp:  98 F (36.7 C)    TempSrc:  Oral    SpO2:  100% 95% 95%  Weight: 85 kg 77.1 kg    Height: 6' (1.829 m) 6' (1.829 m)     Eyes: PERRL, lids and conjunctivae normal ENMT: Mucous membranes are dry. Posterior pharynx clear of any exudate or lesions.  Neck: normal, supple, no masses, no thyromegaly.  No JVD. Respiratory: Expiratory wheezes heard in both lung fields.  Patient with poor inspiratory effort currently on 2 L nasal cannula oxygen. Cardiovascular: Regular rate and rhythm, no murmurs / rubs / gallops. No extremity edema. 2+ pedal pulses. No carotid bruits.  Abdomen: Mild epigastric tenderness to palpation appreciated.  Bowel sounds present. Musculoskeletal: no clubbing / cyanosis. No joint deformity upper and lower extremities. Good ROM, no contractures. Normal muscle tone.  Skin: no rashes, lesions, ulcers. No induration Neurologic: CN 2-12 grossly intact. Sensation intact, DTR normal. Strength 5/5 in all 4.  Psychiatric: Alert, but appears confused when answering questions.   Labs on Admission: I have personally reviewed following labs and imaging studies  CBC: Recent Labs  Lab 05/24/20 0654  WBC 14.0*  NEUTROABS 12.5*  HGB 16.6  HCT 50.2  MCV 97.3  PLT 008   Basic Metabolic Panel: Recent Labs  Lab 05/24/20 0654  NA 135  K 4.6  CL 93*  CO2 25  GLUCOSE 225*  BUN 28*  CREATININE 2.00*  CALCIUM 9.9   GFR: Estimated Creatinine Clearance: 26.8 mL/min (A) (by C-G formula based on SCr of 2 mg/dL (H)). Liver Function Tests: Recent Labs  Lab 05/24/20 0654  AST 89*   ALT 49*  ALKPHOS 175*  BILITOT 3.0*  PROT 6.7  ALBUMIN 3.9   Recent Labs  Lab 05/24/20 0654  LIPASE 1,244*   No results for input(s): AMMONIA in the last 168 hours. Coagulation Profile: Recent Labs  Lab 05/24/20 0654  INR 1.1   Cardiac Enzymes: No results for input(s): CKTOTAL, CKMB, CKMBINDEX, TROPONINI in the last 168 hours. BNP (last 3 results) No results for input(s): PROBNP in the last 8760 hours. HbA1C: No results for input(s): HGBA1C in the last 72 hours. CBG: No results for input(s): GLUCAP in the last 168 hours. Lipid Profile: No results for input(s): CHOL, HDL, LDLCALC, TRIG, CHOLHDL, LDLDIRECT in the last 72 hours. Thyroid Function Tests: No results for input(s): TSH,  T4TOTAL, FREET4, T3FREE, THYROIDAB in the last 72 hours. Anemia Panel: No results for input(s): VITAMINB12, FOLATE, FERRITIN, TIBC, IRON, RETICCTPCT in the last 72 hours. Urine analysis:    Component Value Date/Time   COLORURINE STRAW (A) 01/23/2019 2012   APPEARANCEUR CLEAR 01/23/2019 2012   LABSPEC >1.046 (H) 01/23/2019 2012   PHURINE 5.0 01/23/2019 2012   GLUCOSEU NEGATIVE 01/23/2019 2012   HGBUR SMALL (A) 01/23/2019 2012   BILIRUBINUR NEGATIVE 01/23/2019 2012   KETONESUR 5 (A) 01/23/2019 2012   PROTEINUR NEGATIVE 01/23/2019 2012   UROBILINOGEN 1.0 10/20/2012 0016   NITRITE NEGATIVE 01/23/2019 2012   LEUKOCYTESUR NEGATIVE 01/23/2019 2012   Sepsis Labs: Recent Results (from the past 240 hour(s))  SARS Coronavirus 2 by RT PCR (hospital order, performed in [facility] hospital lab) Nasopharyngeal Nasopharyngeal Swab     Status: None   Collection Time: 05/24/20 11:27 AM   Specimen: Nasopharyngeal Swab  Result Value Ref Range Status   SARS Coronavirus 2 NEGATIVE NEGATIVE Final    Comment: (NOTE) SARS-CoV-2 target nucleic acids are NOT DETECTED.  The SARS-CoV-2 RNA is generally detectable in upper and lower respiratory specimens during the acute phase of infection. The  lowest concentration of SARS-CoV-2 viral copies this assay can detect is 250 copies / mL. A negative result does not preclude SARS-CoV-2 infection and should not be used as the sole basis for treatment or other patient management decisions.  A negative result may occur with improper specimen collection / handling, submission of specimen other than nasopharyngeal swab, presence of viral mutation(s) within the areas targeted by this assay, and inadequate number of viral copies (<250 copies / mL). A negative result must be combined with clinical observations, patient history, and epidemiological information.  Fact Sheet for Patients:   StrictlyIdeas.no  Fact Sheet for Healthcare Providers: BankingDealers.co.za  This test is not yet approved or  cleared by the [place] FDA and has been authorized for detection and/or diagnosis of SARS-CoV-2 by FDA under an Emergency Use Authorization (EUA).  This EUA will remain in effect (meaning this test can be used) for the duration of the COVID-19 declaration under Section 564(b)(1) of the Act, 21 U.S.C. section 360bbb-3(b)(1), unless the authorization is terminated or revoked sooner.  Performed at [facility], [place] [address] [place], [place] [address]      Radiological Exams on Admission: CT ABDOMEN PELVIS WO CONTRAST  Result Date: 05/24/2020 CLINICAL DATA:  Abdominal pain EXAM: CT ABDOMEN AND PELVIS WITHOUT CONTRAST TECHNIQUE: Multidetector CT imaging of the abdomen and pelvis was performed following the standard protocol without IV contrast. COMPARISON:  01/23/2019 FINDINGS: Lower chest: Mild right basilar atelectasis is noted. Hepatobiliary: No focal liver abnormality is seen. Status post cholecystectomy. No biliary dilatation. Pancreas: Pancreas is well visualized and demonstrates some very mild peripancreatic inflammatory change similar to that seen on the prior exam. No pseudocyst is  noted. Some compensatory thickening of the duodenum is noted as well. Spleen: Normal in size without focal abnormality. Adrenals/Urinary Tract: Adrenal glands are within normal limits. Kidneys demonstrate nonobstructing left renal stone measuring 9 mm. This is roughly stable from the prior study. Renal cystic changes noted on the left stable from the prior exam. One of these is hyperdense likely related to hemorrhage but stable. The bladder is well distended. Stomach/Bowel: No obstructive or inflammatory changes of the colon are seen. The appendix is within normal limits. No small bowel or gastric abnormality is seen. Vascular/Lymphatic: Atherosclerotic calcifications of the aorta are noted. No  aneurysmal dilatation is seen. No lymphadenopathy is noted. Reproductive: Prostate is prominent indenting upon the inferior aspect of the bladder but stable from the prior exam. Other: Large fat containing left inguinal hernia is noted. No significant ascites is seen. Musculoskeletal: Degenerative changes of the lumbar spine are noted. Changes of prior right iliac fracture are seen. Changes consistent with Paget's disease are noted as well. IMPRESSION: Changes consistent with acute pancreatitis. No pseudocyst formation is noted. Nonobstructing left renal stone. Stable left renal cysts. Large fat containing inguinal hernia. Chronic changes as described above Electronically Signed   By: Inez Catalina M.D.   On: 05/24/2020 12:27    Chest x-ray: Independently reviewed.  Low lung volumes with bibasilar atelectasis  Assessment/Plan  Acute alcohol induced pancreatitis acute.  Patient presents with complaints of abdominal pain. Found to have elevated lipase of 1224.  CT scanning of the abdomen and pelvis significant for acute pancreatitis without signs of pseudocyst and stable left renal cyst.  Admits to still drinking daily basis.  Suspecting alcohol induced pancreatitis. -Admit to a medical telemetry -Check lipid  panel -N.p.o. and advance diet as tolerated unless noted to have subsequent episodes of hematemesis -Incentive spirometry -Continue lactated Ringer's at 100 mL/h  Hematemesis: Acute.  Patient was reported to have had a large episode of dark coffee-ground emesis at home.  Vital signs otherwise noted to be stable.  Hemoglobin initially noted to be 16.6.  Since being hospitalized patient appears stable and has had no recurrence of symptoms.  -Type and screen -Recheck H&H  -Protonix IV twice daily -Antiemetics as needed -Consider need to formally consult GI for further evaluation if symptoms recur  Leukocytosis: WBC elevated up to 14.  Suspect this is secondary to above. -Recheck CBC in a.m.  Acute kidney injury: Patient baseline creatinine previously noted to be 0.93, but presents elevated at 2 with BUN 28.  Suspect symptoms prerenal in nature given above. -Continue IV fluids as tolerated -Recheck kidney function in a.m.  Bronchitis: Patient was found to be wheezing on physical exam.  Chest x-ray showed low lung volumes. -Continuous pulse oximetry with nasal cannula oxygen maintaining O2 saturations. -Solu-Medrol 125 mg IV x1 dose, then prednisone 40 mg daily -DuoNebs 4 times daily  Alcohol abuse: Patient reports continued to drink beer on a daily basis, but patient is not able to quantify amounts.  No prior history reported of withdrawals and his last drink was yesterday. -CIWA protocols  Hyperglycemia: Initial blood glucose elevated up to 225 on admission.  No reported history of diabetes. -Hypoglycemic protocols -Check hemoglobin A1c -CBGs q. before meals and at bedtime with very sensitive SSI -Adjust insulin regimen as needed  Left renal stone and left renal cyst: As seen on CT noted to be stable without signs of obstruction.  Previously instructed to follow-up with urology in outpatient setting.  Inguinal hernia: As seen on CT inguinal hernia containing on the left renal cyst  appeared to be stable.  No signs of incarceration noted at this time. -May benefit from surgery in the outpatient setting  Suspected dementia: Patient appears to be alert, but otherwise confused.  Is not totally clear if some of this is secondary from him being hard of hearing.  No focal deficits appreciated. -Neurochecks  Elevated liver enzymes hyperbilirubinemia: Acute.  Alkaline phosphatase 175, AST 89, ALT 49, and total bilirubin 3.  Patient previously has had a cholecystectomy. -Recheck liver enzymes in a.m.  DVT prophylaxis: SCD   Code Status: full Family Communication: Unable  to reach son by phone after 2 attempts and message was left Disposition Plan: To be determined Consults called: None Admission status: Inpatient  Norval Morton MD [facility] Pager [phone]   If 7PM-7AM, please contact night-coverage [email or web address] Password [email or web address]  05/24/2020, 1:43 PM

## 2020-05-25 LAB — MAGNESIUM: Magnesium: 1.9 mg/dL (ref 1.7–2.4)

## 2020-05-25 LAB — GLUCOSE, CAPILLARY
Glucose-Capillary: 177 mg/dL — ABNORMAL HIGH (ref 70–99)
Glucose-Capillary: 200 mg/dL — ABNORMAL HIGH (ref 70–99)
Glucose-Capillary: 165 mg/dL — ABNORMAL HIGH (ref 70–99)
Glucose-Capillary: 183 mg/dL — ABNORMAL HIGH (ref 70–99)

## 2020-05-25 LAB — CBG MONITORING, ED: Glucose-Capillary: 178 mg/dL — ABNORMAL HIGH (ref 70–99)

## 2020-05-25 LAB — CBC
HCT: 48.3 % (ref 39.0–52.0)
Hemoglobin: 15.7 g/dL (ref 13.0–17.0)
MCH: 32 pg (ref 26.0–34.0)
MCHC: 32.5 g/dL (ref 30.0–36.0)
MCV: 98.4 fL (ref 80.0–100.0)
Platelets: 149 10*3/uL — ABNORMAL LOW (ref 150–400)
RBC: 4.91 MIL/uL (ref 4.22–5.81)
RDW: 13.1 % (ref 11.5–15.5)
WBC: 14.6 10*3/uL — ABNORMAL HIGH (ref 4.0–10.5)
nRBC: 0 % (ref 0.0–0.2)

## 2020-05-25 LAB — COMPREHENSIVE METABOLIC PANEL
ALT: 21 U/L (ref 0–44)
AST: 63 U/L — ABNORMAL HIGH (ref 15–41)
Albumin: 3 g/dL — ABNORMAL LOW (ref 3.5–5.0)
Alkaline Phosphatase: 111 U/L (ref 38–126)
Anion gap: 9 (ref 5–15)
BUN: 31 mg/dL — ABNORMAL HIGH (ref 8–23)
CO2: 27 mmol/L (ref 22–32)
Calcium: 8.6 mg/dL — ABNORMAL LOW (ref 8.9–10.3)
Chloride: 98 mmol/L (ref 98–111)
Creatinine, Ser: 1.2 mg/dL (ref 0.61–1.24)
GFR calc Af Amer: >60 mL/min (ref >60)
GFR calc non Af Amer: 53 mL/min — ABNORMAL LOW (ref >60)
Glucose, Bld: 175 mg/dL — ABNORMAL HIGH (ref 70–99)
Potassium: 5.9 mmol/L — ABNORMAL HIGH (ref 3.5–5.1)
Sodium: 134 mmol/L — ABNORMAL LOW (ref 135–145)
Total Bilirubin: 3 mg/dL — ABNORMAL HIGH (ref 0.3–1.2)
Total Protein: 5.1 g/dL — ABNORMAL LOW (ref 6.5–8.1)

## 2020-05-25 LAB — PHOSPHORUS: Phosphorus: 2.4 mg/dL — ABNORMAL LOW (ref 2.5–4.6)

## 2020-05-25 MED ORDER — IPRATROPIUM-ALBUTEROL 0.5-2.5 (3) MG/3ML IN SOLN
3.0000 mL | RESPIRATORY_TRACT | Status: DC | PRN
  Administered 2020-05-25: 3 mL via RESPIRATORY_TRACT

## 2020-05-25 MED ORDER — K PHOS MONO-SOD PHOS DI & MONO 155-852-130 MG PO TABS: 250.0000 mg | ORAL_TABLET | Freq: Two times a day (BID) | ORAL | Status: DC

## 2020-05-25 MED ORDER — SODIUM ZIRCONIUM CYCLOSILICATE 10 G PO PACK
10.0000 g | PACK | Freq: Once | ORAL | Status: DC
  Filled 2020-05-25: qty 1

## 2020-05-25 NOTE — ED Notes (Signed)
Visually checked on Pt. Normal breathing pattern and Vitals WDL

## 2020-05-25 NOTE — ED Notes (Signed)
Breakfast Ordered 

## 2020-05-25 NOTE — Progress Notes (Addendum)
PROGRESS NOTE    Ryan Tyler  IHK:[id number] DOB: [birth year] DOA: 05/24/2020 PCP: Josetta Huddle, MD   Brief Narrative:  Patient is a 86 year old male with history of hypertension, hyperlipidemia, bladder cancer status post TUR BT, BPH, status post cholecystectomy, nephrolithiasis, GERD who presented with abdomen pain, vomiting which was coffee-ground in color.  Patient was a very poor historian and was confused on presentation.  He was brought from home.  Patient has chronic alcohol abuse and drinks on daily basis, mainly beer.  He lives with his son.  He has been admitted here in April with alcoholic pancreatitis.  On presentation he was mildly hypertensive, has mild leukocytosis, AKI with creatinine of 2.  Lipase was elevated.  Patient was admitted for the management of alcoholic pancreatitis.  Started on IV fluids.  Hospital course remarkable for agitation.  Assessment & Plan:   Principal Problem:   Acute pancreatitis Active Problems:   Leukocytosis   Bronchitis   Hematemesis   AKI (acute kidney injury) ([place])   Alcohol abuse   Hyperglycemia   Acute alcoholic pancreatitis: Had been admitted in the past with similar complaint.  Presents with abdominal pain.  Elevated levels of 1224.  CT abdomen/pelvis showed acute pancreatitis without signs of pseudocyst.  Patient drinks daily, mainly beer.  Keep n.p.o.  Started on IV fluids which we will continue. Patient has mild elevated liver enzymes.  He has history of cholecystectomy.  Hematemesis: Patient had abdominal pain, nausea and vomiting.  There was report of large episode of dark brown emesis at home.  This could be associated with Mallory-Weiss tear with vomiting or could be from alcoholic gastritis.  His hemoglobin is stable.  Continue Protonix for now.  Deferring GI consultation at this moment.  AKI: Kidney function improved with IV fluids.  Continue IV fluids.  Monitor BMP Patient also has hyperkalemia which is most likely from  hemolysis.  Given a dose of Lokelma.  Leukocytosis: Most likely reactive.  No indication for antibiotic therapy.  Wheezing: Probably has history of COPD.  He was wheezing on presentation.  Started on steroids.  Continue bronchodilators.  Wheezing has much improved during my evaluation this morning.  Chronic alcohol abuse: Drinks beer on daily basis.  Monitor for withdrawal.  Last drink was day before admission.  Monitor on CIWA protocol.  He has thrombocytopenia most likely associated with chronic liver disease.  Hyperglycemia: Continue sliding scale insulin.  Monitor blood sugars.  Hemoglobin A1c of 5.9  Left renal stone/left renal cyst: Stable without signs of obstruction.  Follow-up with urology as an outpatient.  Patient also has left inguinal hernia as seen on the CT.  Stable.  Dementia/deconditioning/debility: Confusion on presentation.  Could be from alcohol withdrawal or he could have underlying dementia.  No focal logical deficits.  Continue supportive care.  Delirium precautions.  Haldol for severe agitation.               DVT prophylaxis:ScD Code Status: Full Family Communication: Son on phone on 05/25/20 Status is: Inpatient  Remains inpatient appropriate because:Unsafe d/c plan   Dispo: The patient is from: Home              Anticipated d/c is to: Home              Anticipated d/c date is: 2 days              Patient currently is not medically stable to d/c.   Consultants: None  Procedures:None Antimicrobials:  Anti-infectives (From admission, onward)   None      Subjective: Patient seen and examined the bedside in the emergency department.  Hemodynamically stable during my evaluation but he was sleeping very deeply.  Probably was sedated after he was given lorazepam as per CIWA protocol.  He was not in any current distress.  Objective: Vitals:   05/25/20 0612 05/25/20 0645 05/25/20 0947 05/25/20 1138  BP:  132/78  (!) 149/81  Pulse: 84  74 79  Resp:     16  Temp:    97.9 F (36.6 C)  TempSrc:    Oral  SpO2: 95%  96% 100%  Weight:      Height:        Intake/Output Summary (Last 24 hours) at 05/25/2020 1409 Last data filed at 05/24/2020 1957 Gross per 24 hour  Intake 0 ml  Output --  Net 0 ml   Filed Weights   05/24/20 0639 05/24/20 0643  Weight: 85 kg 77.1 kg    Examination:  General exam: Elderly male, in deep sleep Respiratory system: Bilateral equal air entry, normal vesicular breath sounds, no wheezes or crackles  Cardiovascular system: S1 & S2 heard, RRR. No JVD, murmurs, rubs, gallops or clicks. No pedal edema. Gastrointestinal system: Abdomen is mildly distended, soft and nontender. No organomegaly or masses felt. Normal bowel sounds heard. Central nervous system: Not alert and oriented Extremities: No edema, no clubbing ,no cyanosis Skin: No rashes, lesions or ulcers,no icterus ,no pallor   Data Reviewed: I have personally reviewed following labs and imaging studies  CBC: Recent Labs  Lab 05/24/20 0654 05/24/20 1728 05/25/20 1157  WBC 14.0*  --  14.6*  NEUTROABS 12.5*  --   --   HGB 16.6 15.7 15.7  HCT 50.2 48.1 48.3  MCV 97.3  --  98.4  PLT 209  --  295*   Basic Metabolic Panel: Recent Labs  Lab 05/24/20 0654 05/25/20 1157  NA 135 134*  K 4.6 5.9*  CL 93* 98  CO2 25 27  GLUCOSE 225* 175*  BUN 28* 31*  CREATININE 2.00* 1.20  CALCIUM 9.9 8.6*  MG  --  1.9  PHOS  --  2.4*   GFR: Estimated Creatinine Clearance: 44.6 mL/min (by C-G formula based on SCr of 1.2 mg/dL). Liver Function Tests: Recent Labs  Lab 05/24/20 0654 05/25/20 1157  AST 89* 63*  ALT 49* 21  ALKPHOS 175* 111  BILITOT 3.0* 3.0*  PROT 6.7 5.1*  ALBUMIN 3.9 3.0*   Recent Labs  Lab 05/24/20 0654  LIPASE 1,244*   No results for input(s): AMMONIA in the last 168 hours. Coagulation Profile: Recent Labs  Lab 05/24/20 0654  INR 1.1   Cardiac Enzymes: No results for input(s): CKTOTAL, CKMB, CKMBINDEX, TROPONINI in the  last 168 hours. BNP (last 3 results) No results for input(s): PROBNP in the last 8760 hours. HbA1C: Recent Labs    05/24/20 1723  HGBA1C 5.9*   CBG: Recent Labs  Lab 05/24/20 1845 05/24/20 2257 05/25/20 0754 05/25/20 1142 05/25/20 1204  GLUCAP 188* 274* 178* 200* 177*   Lipid Profile: Recent Labs    05/24/20 1723  CHOL 150  HDL 48  LDLCALC 83  TRIG 94  CHOLHDL 3.1   Thyroid Function Tests: No results for input(s): TSH, T4TOTAL, FREET4, T3FREE, THYROIDAB in the last 72 hours. Anemia Panel: No results for input(s): VITAMINB12, FOLATE, FERRITIN, TIBC, IRON, RETICCTPCT in the last 72 hours. Sepsis Labs: No results for input(s): PROCALCITON, LATICACIDVEN  in the last 168 hours.  Recent Results (from the past 240 hour(s))  SARS Coronavirus 2 by RT PCR (hospital order, performed in [facility] hospital lab) Nasopharyngeal Nasopharyngeal Swab     Status: None   Collection Time: 05/24/20 11:27 AM   Specimen: Nasopharyngeal Swab  Result Value Ref Range Status   SARS Coronavirus 2 NEGATIVE NEGATIVE Final    Comment: (NOTE) SARS-CoV-2 target nucleic acids are NOT DETECTED.  The SARS-CoV-2 RNA is generally detectable in upper and lower respiratory specimens during the acute phase of infection. The lowest concentration of SARS-CoV-2 viral copies this assay can detect is 250 copies / mL. A negative result does not preclude SARS-CoV-2 infection and should not be used as the sole basis for treatment or other patient management decisions.  A negative result may occur with improper specimen collection / handling, submission of specimen other than nasopharyngeal swab, presence of viral mutation(s) within the areas targeted by this assay, and inadequate number of viral copies (<250 copies / mL). A negative result must be combined with clinical observations, patient history, and epidemiological information.  Fact Sheet for Patients:    StrictlyIdeas.no  Fact Sheet for Healthcare Providers: BankingDealers.co.za  This test is not yet approved or  cleared by the [place] FDA and has been authorized for detection and/or diagnosis of SARS-CoV-2 by FDA under an Emergency Use Authorization (EUA).  This EUA will remain in effect (meaning this test can be used) for the duration of the COVID-19 declaration under Section 564(b)(1) of the Act, 21 U.S.C. section 360bbb-3(b)(1), unless the authorization is terminated or revoked sooner.  Performed at [facility], [place] [address], [place], [place] [address]          Radiology Studies: CT ABDOMEN PELVIS WO CONTRAST  Result Date: 05/24/2020 CLINICAL DATA:  Abdominal pain EXAM: CT ABDOMEN AND PELVIS WITHOUT CONTRAST TECHNIQUE: Multidetector CT imaging of the abdomen and pelvis was performed following the standard protocol without IV contrast. COMPARISON:  01/23/2019 FINDINGS: Lower chest: Mild right basilar atelectasis is noted. Hepatobiliary: No focal liver abnormality is seen. Status post cholecystectomy. No biliary dilatation. Pancreas: Pancreas is well visualized and demonstrates some very mild peripancreatic inflammatory change similar to that seen on the prior exam. No pseudocyst is noted. Some compensatory thickening of the duodenum is noted as well. Spleen: Normal in size without focal abnormality. Adrenals/Urinary Tract: Adrenal glands are within normal limits. Kidneys demonstrate nonobstructing left renal stone measuring 9 mm. This is roughly stable from the prior study. Renal cystic changes noted on the left stable from the prior exam. One of these is hyperdense likely related to hemorrhage but stable. The bladder is well distended. Stomach/Bowel: No obstructive or inflammatory changes of the colon are seen. The appendix is within normal limits. No small bowel or gastric abnormality is seen. Vascular/Lymphatic:  Atherosclerotic calcifications of the aorta are noted. No aneurysmal dilatation is seen. No lymphadenopathy is noted. Reproductive: Prostate is prominent indenting upon the inferior aspect of the bladder but stable from the prior exam. Other: Large fat containing left inguinal hernia is noted. No significant ascites is seen. Musculoskeletal: Degenerative changes of the lumbar spine are noted. Changes of prior right iliac fracture are seen. Changes consistent with Paget's disease are noted as well. IMPRESSION: Changes consistent with acute pancreatitis. No pseudocyst formation is noted. Nonobstructing left renal stone. Stable left renal cysts. Large fat containing inguinal hernia. Chronic changes as described above Electronically Signed   By: Linus Mako.D.  On: 05/24/2020 12:27   DG CHEST PORT 1 VIEW  Result Date: 05/24/2020 CLINICAL DATA:  Shortness of breath, vomiting EXAM: PORTABLE CHEST 1 VIEW COMPARISON:  01/23/2019 FINDINGS: Stable cardiomediastinal contours. Atherosclerotic calcification of the aortic knob. Low lung volumes. Elevation of the right hemidiaphragm. Mild streaky bibasilar opacities, favor atelectasis. No pleural effusion. No pneumothorax. IMPRESSION: Low lung volumes with streaky bibasilar opacities, favor atelectasis. Electronically Signed   By: Davina Poke D.O.   On: 05/24/2020 16:29        Scheduled Meds: . folic acid  1 mg Oral Daily  . insulin aspart  0-5 Units Subcutaneous QHS  . insulin aspart  0-6 Units Subcutaneous TID WC  . LORazepam  0-4 mg Intravenous Q6H   Followed by  . [START ON 05/26/2020] LORazepam  0-4 mg Intravenous Q12H  . multivitamin with minerals  1 tablet Oral Daily  . pantoprazole (PROTONIX) IV  40 mg Intravenous Q12H  . predniSONE  40 mg Oral Q breakfast  . sodium chloride flush  3 mL Intravenous Q12H  . sodium zirconium cyclosilicate  10 g Oral Once  . thiamine  100 mg Oral Daily   Or  . thiamine  100 mg Intravenous Daily   Continuous  Infusions: . lactated ringers 100 mL/hr at 05/25/20 1314     LOS: 1 day    Time spent: More than 50% of that time was spent in counseling and/or coordination of care.      Shelly Coss, MD [facility] P8/01/2020, 2:09 PM

## 2020-05-25 NOTE — Progress Notes (Signed)
Patient arrived to [phone]. Alert and oriented to self and year. Denies any abdominal pain. Stating: I want to get out of here" and "Where are my shorts?" Pt in bed at lowest position. Bed alarm on.
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
## 2020-05-25 NOTE — ED Notes (Addendum)
Pt began screaming, this EMT went into room and saw pt climbing over side rail (both rails were up, call bell was within reach and bed was in lowest position). Pt fell on ground before EMT could get to that side of bed to assist patient to ground. Pt did not hit head, pt is only complaining of ache on right flank, redness noted at that spot. Pt stood up with assistance from this EMT. Pt has been very confused throughout the night (hand off report from night shift) and continued to be confused this morning, directable though. Pt back in bed, call bell within reach, bed in lowest position. RN aware and came in after the event and assessed pts flank area. RN will message doctor.

## 2020-05-25 NOTE — ED Notes (Signed)
Patient is refusing to wear nasal canula, BP cuff, or cardiac monitor at this time. Sp02 sensor is in place on left foot. Patient's Sp02 is approx. 84-92% on room air, Pulse 71. RN aware.
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
## 2020-05-26 LAB — CBC WITH DIFFERENTIAL/PLATELET
Abs Immature Granulocytes: 0.05 10*3/uL (ref 0.00–0.07)
Basophils Absolute: 0 10*3/uL (ref 0.0–0.1)
Basophils Relative: 0 %
Eosinophils Absolute: 0 10*3/uL (ref 0.0–0.5)
Eosinophils Relative: 0 %
HCT: 40.9 % (ref 39.0–52.0)
Hemoglobin: 13.4 g/dL (ref 13.0–17.0)
Immature Granulocytes: 0 %
Lymphocytes Relative: 3 %
Lymphs Abs: 0.4 10*3/uL — ABNORMAL LOW (ref 0.7–4.0)
MCH: 32.1 pg (ref 26.0–34.0)
MCHC: 32.8 g/dL (ref 30.0–36.0)
MCV: 98.1 fL (ref 80.0–100.0)
Monocytes Absolute: 0.9 10*3/uL (ref 0.1–1.0)
Monocytes Relative: 7 %
Neutro Abs: 11.8 10*3/uL — ABNORMAL HIGH (ref 1.7–7.7)
Neutrophils Relative %: 90 %
Platelets: 149 10*3/uL — ABNORMAL LOW (ref 150–400)
RBC: 4.17 MIL/uL — ABNORMAL LOW (ref 4.22–5.81)
RDW: 13.1 % (ref 11.5–15.5)
WBC: 13.1 10*3/uL — ABNORMAL HIGH (ref 4.0–10.5)
nRBC: 0 % (ref 0.0–0.2)

## 2020-05-26 LAB — BASIC METABOLIC PANEL
Anion gap: 7 (ref 5–15)
BUN: 30 mg/dL — ABNORMAL HIGH (ref 8–23)
CO2: 28 mmol/L (ref 22–32)
Calcium: 8.2 mg/dL — ABNORMAL LOW (ref 8.9–10.3)
Chloride: 100 mmol/L (ref 98–111)
Creatinine, Ser: 1.05 mg/dL (ref 0.61–1.24)
GFR calc Af Amer: >60 mL/min (ref >60)
GFR calc non Af Amer: >60 mL/min (ref >60)
Glucose, Bld: 166 mg/dL — ABNORMAL HIGH (ref 70–99)
Potassium: 4.6 mmol/L (ref 3.5–5.1)
Sodium: 135 mmol/L (ref 135–145)

## 2020-05-26 LAB — GLUCOSE, CAPILLARY
Glucose-Capillary: 157 mg/dL — ABNORMAL HIGH (ref 70–99)
Glucose-Capillary: 162 mg/dL — ABNORMAL HIGH (ref 70–99)
Glucose-Capillary: 180 mg/dL — ABNORMAL HIGH (ref 70–99)
Glucose-Capillary: 190 mg/dL — ABNORMAL HIGH (ref 70–99)
Glucose-Capillary: 157 mg/dL — ABNORMAL HIGH (ref 70–99)

## 2020-05-26 LAB — PHOSPHORUS: Phosphorus: 3 mg/dL (ref 2.5–4.6)

## 2020-05-26 MED ORDER — LOPERAMIDE HCL 1 MG/7.5ML PO SUSP
2.0000 mg | Freq: Four times a day (QID) | ORAL | Status: DC | PRN
  Filled 2020-05-26: qty 15

## 2020-05-26 MED ORDER — PANTOPRAZOLE SODIUM 40 MG PO TBEC
40.0000 mg | DELAYED_RELEASE_TABLET | Freq: Every day | ORAL | Status: DC
  Administered 2020-05-27: 40 mg via ORAL
  Filled 2020-05-26 (×2): qty 1

## 2020-05-26 NOTE — Progress Notes (Signed)
PROGRESS NOTE    Ryan Tyler  DHR:[id number] DOB: 1928-03-29 DOA: 05/24/2020 PCP: Josetta Huddle, MD   Brief Narrative:  Patient is a 86 year old male with history of hypertension, hyperlipidemia, bladder cancer status post TUR BT, BPH, status post cholecystectomy, nephrolithiasis, GERD who presented with abdomen pain, vomiting which was coffee-ground in color.  Patient was a very poor historian and was confused on presentation.  He was brought from home.  Patient has chronic alcohol abuse and drinks on daily basis, mainly beer.  He lives with his son.  He has been admitted here in April with alcoholic pancreatitis.  On presentation he was mildly hypertensive, has mild leukocytosis, AKI with creatinine of 2.  Lipase was elevated.  Patient was admitted for the management of alcoholic pancreatitis.  Started on IV fluids.  Hospital course remarkable for agitation.  PT/OT recommended skilled nursing facility but he wants to go home. Given his advanced age, poor living condition, noncompliance and regular alcohol abuse ,we are planning to monitor him 1 more night with full liquid, if he continues to remain stable, advance the diet and plan for discharge tomorrow with home health.  Assessment & Plan:   Principal Problem:   Acute pancreatitis Active Problems:   Leukocytosis   Bronchitis   Hematemesis   AKI (acute kidney injury) ([place])   Alcohol abuse   Hyperglycemia   Acute alcoholic pancreatitis: Had been admitted in the past with similar complaint.  Presents with abdominal pain.  Elevated levels of 1224.  CT abdomen/pelvis showed acute pancreatitis without signs of pseudocyst.  Patient drinks daily, mainly beer. Started on IV fluids which we will continue. Patient has mild elevated liver enzymes.  He has history of cholecystectomy. His abdominal pain has improved.  No nausea or vomiting.  Diet advanced to full liquid.  Advance the diet tomorrow to regular before discharging home if he remains  stable.  Hematemesis: Patient had abdominal pain, nausea and vomiting.  There was report of large episode of dark brown emesis at home.  This could be associated with Mallory-Weiss tear with vomiting or could be from alcoholic gastritis.  His hemoglobin is stable.  Continue Protonix for now.  Deferring GI consultation at this moment.  No further episodes of hematemesis after admission.  AKI: Resolved with IV fluids.  Patient also has hyperkalemia which is most likely from hemolysis.  Given a dose of Lokelma.  Leukocytosis: Most likely reactive.  No indication for antibiotic therapy.  Wheezing: Probably has history of COPD.  He was wheezing on presentation.  Started on steroids.  Continue bronchodilators.  Wheezing has much improved during my evaluation this morning.  Chronic alcohol abuse: Drinks beer on daily basis.  Monitor for withdrawal.  Last drink was day before admission.  Monitor on CIWA protocol.  He has thrombocytopenia most likely associated with chronic liver disease. He has very poor social living condition.  I talked to the son yesterday and they said that all his family  enjoy drinking everyday.  Hyperglycemia: Continue sliding scale insulin.  Monitor blood sugars.  Hemoglobin A1c of 5.9  Left renal stone/left renal cyst: Stable without signs of obstruction.  Follow-up with urology as an outpatient.  Patient also has left inguinal hernia as seen on the CT.  Stable.  Dementia/deconditioning/debility: Confused  on presentation.  Could be from alcohol withdrawal or he could have underlying dementia.  No focal neurological deficits.  Continue supportive care.  Delirium precautions.  Haldol for severe agitation. This morning, he was alert,  oriented and communicative.               DVT prophylaxis:ScD Code Status: Full Family Communication: Son on phone on 05/25/20 Try to call the son again today, could not be reached, normal left. Status is: Inpatient  Remains inpatient  appropriate because:Unsafe d/c plan   Dispo: The patient is from: Home              Anticipated d/c is to: Home              Anticipated d/c date is: Tomorrow              Patient currently is not medically stable to d/c.  If he remains stable, advance the diet tomorrow and send him home with home health  Consultants: None  Procedures:None Antimicrobials:  Anti-infectives (From admission, onward)   None      Subjective: Patient seen and examined the bedside today.  Comfortable.  Sitter at the bedside which has been discontinued now.  Comfortable.  Eager to go home.  Denies any abdomen pain, nausea or vomiting.  Abdomen was soft and nontender.  Objective: Vitals:   05/25/20 2327 05/26/20 0323 05/26/20 0700 05/26/20 1102  BP: 139/75 133/70 (!) 151/79 132/72  Pulse: 64 69 64 83  Resp: 18 18 16 18   Temp: 98 F (36.7 C) 98.1 F (36.7 C) 98 F (36.7 C) 98 F (36.7 C)  TempSrc: Axillary Oral Axillary Oral  SpO2: 96% 95% 97% 95%  Weight:      Height:        Intake/Output Summary (Last 24 hours) at 05/26/2020 1245 Last data filed at 05/26/2020 1143 Gross per 24 hour  Intake 720 ml  Output 1700 ml  Net -980 ml   Filed Weights   05/24/20 0639 05/24/20 0643  Weight: 85 kg 77.1 kg    Examination:   General exam: Elderly deconditioned but pleasant male HEENT:PERRL,Oral mucosa moist, Ear/Nose normal on gross exam Respiratory system: Bilateral equal air entry, normal vesicular breath sounds, no wheezes or crackles  Cardiovascular system: S1 & S2 heard, RRR. No JVD, murmurs, rubs, gallops or clicks. Gastrointestinal system: Abdomen is nondistended, soft and nontender. No organomegaly or masses felt. Normal bowel sounds heard. Central nervous system: Alert and oriented. No focal neurological deficits. Extremities: No edema, no clubbing ,no cyanosis, distal peripheral pulses palpable. Skin: No rashes, lesions or ulcers,no icterus ,no pallor    Data Reviewed: I have personally  reviewed following labs and imaging studies  CBC: Recent Labs  Lab 05/24/20 0654 05/24/20 1728 05/25/20 1157 05/26/20 0241  WBC 14.0*  --  14.6* 13.1*  NEUTROABS 12.5*  --   --  11.8*  HGB 16.6 15.7 15.7 13.4  HCT 50.2 48.1 48.3 40.9  MCV 97.3  --  98.4 98.1  PLT 209  --  149* 124*   Basic Metabolic Panel: Recent Labs  Lab 05/24/20 0654 05/25/20 1157 05/26/20 0241  NA 135 134* 135  K 4.6 5.9* 4.6  CL 93* 98 100  CO2 25 27 28   GLUCOSE 225* 175* 166*  BUN 28* 31* 30*  CREATININE 2.00* 1.20 1.05  CALCIUM 9.9 8.6* 8.2*  MG  --  1.9  --   PHOS  --  2.4* 3.0   GFR: Estimated Creatinine Clearance: 51 mL/min (by C-G formula based on SCr of 1.05 mg/dL). Liver Function Tests: Recent Labs  Lab 05/24/20 0654 05/25/20 1157  AST 89* 63*  ALT 49* 21  ALKPHOS 175* 111  BILITOT 3.0* 3.0*  PROT 6.7 5.1*  ALBUMIN 3.9 3.0*   Recent Labs  Lab 05/24/20 0654  LIPASE 1,244*   No results for input(s): AMMONIA in the last 168 hours. Coagulation Profile: Recent Labs  Lab 05/24/20 0654  INR 1.1   Cardiac Enzymes: No results for input(s): CKTOTAL, CKMB, CKMBINDEX, TROPONINI in the last 168 hours. BNP (last 3 results) No results for input(s): PROBNP in the last 8760 hours. HbA1C: Recent Labs    05/24/20 1723  HGBA1C 5.9*   CBG: Recent Labs  Lab 05/25/20 1624 05/25/20 2058 05/26/20 0605 05/26/20 0732 05/26/20 1138  GLUCAP 165* 183* 157* 162* 180*   Lipid Profile: Recent Labs    05/24/20 1723  CHOL 150  HDL 48  LDLCALC 83  TRIG 94  CHOLHDL 3.1   Thyroid Function Tests: No results for input(s): TSH, T4TOTAL, FREET4, T3FREE, THYROIDAB in the last 72 hours. Anemia Panel: No results for input(s): VITAMINB12, FOLATE, FERRITIN, TIBC, IRON, RETICCTPCT in the last 72 hours. Sepsis Labs: No results for input(s): PROCALCITON, LATICACIDVEN in the last 168 hours.  Recent Results (from the past 240 hour(s))  SARS Coronavirus 2 by RT PCR (hospital order, performed  in [facility] hospital lab) Nasopharyngeal Nasopharyngeal Swab     Status: None   Collection Time: 05/24/20 11:27 AM   Specimen: Nasopharyngeal Swab  Result Value Ref Range Status   SARS Coronavirus 2 NEGATIVE NEGATIVE Final    Comment: (NOTE) SARS-CoV-2 target nucleic acids are NOT DETECTED.  The SARS-CoV-2 RNA is generally detectable in upper and lower respiratory specimens during the acute phase of infection. The lowest concentration of SARS-CoV-2 viral copies this assay can detect is 250 copies / mL. A negative result does not preclude SARS-CoV-2 infection and should not be used as the sole basis for treatment or other patient management decisions.  A negative result may occur with improper specimen collection / handling, submission of specimen other than nasopharyngeal swab, presence of viral mutation(s) within the areas targeted by this assay, and inadequate number of viral copies (<250 copies / mL). A negative result must be combined with clinical observations, patient history, and epidemiological information.  Fact Sheet for Patients:   StrictlyIdeas.no  Fact Sheet for Healthcare Providers: BankingDealers.co.za  This test is not yet approved or  cleared by the [place] FDA and has been authorized for detection and/or diagnosis of SARS-CoV-2 by FDA under an Emergency Use Authorization (EUA).  This EUA will remain in effect (meaning this test can be used) for the duration of the COVID-19 declaration under Section 564(b)(1) of the Act, 21 U.S.C. section 360bbb-3(b)(1), unless the authorization is terminated or revoked sooner.  Performed at [facility], [place] [address], [place], [place] [address]          Radiology Studies: DG CHEST PORT 1 VIEW  Result Date: 05/24/2020 CLINICAL DATA:  Shortness of breath, vomiting EXAM: PORTABLE CHEST 1 VIEW COMPARISON:  01/23/2019 FINDINGS: Stable cardiomediastinal contours.  Atherosclerotic calcification of the aortic knob. Low lung volumes. Elevation of the right hemidiaphragm. Mild streaky bibasilar opacities, favor atelectasis. No pleural effusion. No pneumothorax. IMPRESSION: Low lung volumes with streaky bibasilar opacities, favor atelectasis. Electronically Signed   By: Davina Poke D.O.   On: 05/24/2020 16:29        Scheduled Meds: . folic acid  1 mg Oral Daily  . insulin aspart  0-5 Units Subcutaneous QHS  . insulin aspart  0-6 Units Subcutaneous TID WC  . LORazepam  0-4  mg Intravenous Q6H   Followed by  . LORazepam  0-4 mg Intravenous Q12H  . multivitamin with minerals  1 tablet Oral Daily  . pantoprazole  40 mg Oral Daily  . predniSONE  40 mg Oral Q breakfast  . sodium chloride flush  3 mL Intravenous Q12H  . thiamine  100 mg Oral Daily   Or  . thiamine  100 mg Intravenous Daily   Continuous Infusions: . lactated ringers 75 mL/hr at 05/26/20 1110     LOS: 2 days    Time spent:25 mins.More than 50% of that time was spent in counseling and/or coordination of care.      Shelly Coss, MD [facility] P8/02/2020, 12:45 PM

## 2020-05-26 NOTE — Plan of Care (Signed)
  Problem: Education: Goal: Knowledge of General Education information will improve Description Including pain rating scale, medication(s)/side effects and non-pharmacologic comfort measures Outcome: Progressing   

## 2020-05-26 NOTE — Evaluation (Signed)
Occupational Therapy Evaluation Patient Details Name: Ryan Tyler MRN: [id number] DOB: 07/05/1928 Today's Date: 05/26/2020    History of Present Illness Pt is 86 yo male with PMH of HTN, HLD, bladder CA , BPH, GERD, nephrolithiasis, cholecystectomy.  Pt presented with abdomen pain and vomiting coffee ground emesis.  Pt with hx of chronic alcohol abuse. Admitted with acute alcoholic pancreatitis, hematemesis, and AKI. While in ED pt was confused, combatitive and had a fall.   Clinical Impression   Pt limited historian and HOH and unable to recall PLOF. Per chart review, pt was independent but son lives with him. Pt reports using RW for mobility at home but unsure due to pt cognition.  Pt requiring max verbal cues for sequencing and memory. Pt forgetting multistep commands and needing max verbal cues for safety with keeping walker on the ground and wanting to walk without walker even though previously admitting he needs it for mobility. Requires Min A with multimodal cues for ADLs, transfers, and mobility due to decreased balance, weakness, and decreased cognition. Recommending SNF vs home with 24 hr care and HHPT pending progress and input from family on level on assist available.     Follow Up Recommendations  Supervision/Assistance - 24 hour;SNF;Home health OT    Equipment Recommendations  Other (comment) (TBD)       Precautions / Restrictions Precautions Precautions: Fall Precaution Comments: Pt has sitter due to confused/combatitve/fall in ED Restrictions Weight Bearing Restrictions: No      Mobility Bed Mobility Overal bed mobility: Needs Assistance Bed Mobility: Supine to Sit     Supine to sit: Min assist     General bed mobility comments: min A with LOB backwards and cues for sequencing sitting EOB with feet on floor  Transfers Overall transfer level: Needs assistance Equipment used: Rolling walker (2 wheeled) Transfers: Sit to/from Stand Sit to Stand: Min assist          General transfer comment: min A to power up and cues for safe hand placement    Balance Overall balance assessment: Needs assistance Sitting-balance support: Feet supported;No upper extremity supported Sitting balance-Leahy Scale: Fair Sitting balance - Comments: min A while shifting weight Postural control: Posterior lean Standing balance support: No upper extremity supported;During functional activity Standing balance-Leahy Scale: Poor Standing balance comment: required use of AD; tends to lean posteriorly                           ADL either performed or assessed with clinical judgement   ADL Overall ADL's : Needs assistance/impaired     Grooming: Wash/dry hands;Minimal assistance;Standing;Cueing for sequencing Grooming Details (indicate cue type and reason): min a with verbal cues for sequencing Upper Body Bathing: Sitting;Minimal assistance;Cueing for sequencing;Cueing for safety   Lower Body Bathing: Sitting/lateral leans;Sit to/from stand;Minimal assistance;Cueing for safety;Cueing for sequencing   Upper Body Dressing : Minimal assistance;Cueing for sequencing;Cueing for safety;Sitting   Lower Body Dressing: Sitting/lateral leans;Sit to/from stand;Cueing for sequencing;Cueing for safety;Minimal assistance Lower Body Dressing Details (indicate cue type and reason): min A for balance Toilet Transfer: Minimal assistance;RW;Ambulation;Cueing for safety;Cueing for sequencing Toilet Transfer Details (indicate cue type and reason): Min A - simulated to recliner Toileting- Clothing Manipulation and Hygiene: Sitting/lateral lean;Sit to/from stand;Cueing for safety;Cueing for sequencing;Minimal assistance Toileting - Clothing Manipulation Details (indicate cue type and reason): Min A for balance   Tub/Shower Transfer Details (indicate cue type and reason): deferred due to pt safety Functional mobility during ADLs:  Minimal assistance;Rolling walker;Cueing for  safety;Cueing for sequencing (RW) General ADL Comments: Pt with decreased balance, weakness, and decreased cognition.     Vision Baseline Vision/History: Wears glasses Patient Visual Report:  (pt unable to state due to limitied historian)              Pertinent Vitals/Pain Pain Assessment: No/denies pain     Hand Dominance Right   Extremity/Trunk Assessment Upper Extremity Assessment Upper Extremity Assessment: Generalized weakness;RUE deficits/detail;LUE deficits/detail;Difficult to assess due to impaired cognition RUE Deficits / Details: decreased coordination with fine motor movements Full ROM with 3/5 MMT RUE Coordination: decreased fine motor LUE Deficits / Details: decreased coordination with fine motor movements full ROM with 3/5 MMT LUE Coordination: decreased fine motor   Lower Extremity Assessment Lower Extremity Assessment: Defer to PT evaluation   Cervical / Trunk Assessment Cervical / Trunk Assessment: Normal   Communication Communication Communication: HOH   Cognition Arousal/Alertness: Awake/alert Behavior During Therapy: WFL for tasks assessed/performed Overall Cognitive Status: No family/caregiver present to determine baseline cognitive functioning Area of Impairment: Attention;Following commands;Safety/judgement;Awareness;Problem solving;Memory                   Current Attention Level: Focused Memory: Decreased short-term memory;Decreased recall of precautions Following Commands: Follows one step commands consistently;Follows multi-step commands inconsistently Safety/Judgement: Decreased awareness of safety;Decreased awareness of deficits Awareness: Intellectual Problem Solving: Slow processing;Difficulty sequencing;Requires verbal cues;Requires tactile cues General Comments: Pt requiring max verbal cues for sequencing and memory. Pt forgetting multistep commands and needing max verbal cues for safety with keeping walker on the ground and  wanting to walk without walker even though previously admitting he needs it for mobility.   General Comments  2LPM O2 - VSS and no wheezing with exertion             Home Living Family/patient expects to be discharged to:: Private residence Living Arrangements: Children Available Help at Discharge: Family Type of Home: House Home Access: Stairs to enter Technical brewer of Steps: 5 Entrance Stairs-Rails: Right;Left Home Layout: One level     Bathroom Shower/Tub: Teacher, early years/pre: Standard     Home Equipment: Environmental consultant - 2 wheels   Additional Comments: Pt unable to provide PLOF or home situation and family not present.  Information taken from prior admission. Pt reporting he uses RW at home      Prior Functioning/Environment Level of Independence: Needs assistance  Gait / Transfers Assistance Needed: Pt utilizes RW for mobility     Comments: Per prior admission pt was independent but son lives with him.  Pt confused and HOH - unable to provide accurate PLOF        OT Problem List: Decreased strength;Decreased activity tolerance;Impaired balance (sitting and/or standing);Decreased coordination;Decreased cognition;Decreased safety awareness;Decreased knowledge of use of DME or AE;Decreased knowledge of precautions      OT Treatment/Interventions: Self-care/ADL training;Therapeutic exercise;Energy conservation;DME and/or AE instruction;Therapeutic activities;Cognitive remediation/compensation;Patient/family education;Balance training    OT Goals(Current goals can be found in the care plan section) Acute Rehab OT Goals Patient Stated Goal: unable OT Goal Formulation: With patient Time For Goal Achievement: 06/09/20 Potential to Achieve Goals: Good  OT Frequency: Min 2X/week    AM-PAC OT "6 Clicks" Daily Activity     Outcome Measure Help from another person eating meals?: None Help from another person taking care of personal grooming?: A  Little Help from another person toileting, which includes using toliet, bedpan, or urinal?: A Little Help from another person bathing (including  washing, rinsing, drying)?: A Little Help from another person to put on and taking off regular upper body clothing?: A Little Help from another person to put on and taking off regular lower body clothing?: A Little 6 Click Score: 19   End of Session Equipment Utilized During Treatment: Gait belt;Rolling walker Nurse Communication: Mobility status  Activity Tolerance: Patient tolerated treatment well Patient left: in chair;with call bell/phone within reach;with chair alarm set;with nursing/sitter in room  OT Visit Diagnosis: Unsteadiness on feet (R26.81);Muscle weakness (generalized) (M62.81);History of falling (Z91.81);Other symptoms and signs involving cognitive function                Time: 9485-4627 OT Time Calculation (min): 17 min Charges:  OT General Charges $OT Visit: 1 Visit OT Evaluation $OT Eval Moderate Complexity: 1 Mod  Maday Guarino/OTS  Corwin Kuiken 05/26/2020, 11:35 AM

## 2020-05-26 NOTE — TOC Initial Note (Signed)
Transition of Care [facility]) - Initial/Assessment Note    Patient Details  Name: Ryan Tyler MRN: [id number] Date of Birth: 01/22/1928  [facility]) CM/SW Contact:    Joanne Chars, LCSW Phone Number: 05/26/2020, 11:50 AM  Clinical Narrative:        CSW met with pt to discuss SNF recommendation.  Pt reports that he would like to return home and does not want to go to rehab facility.  Pt has not worked with [facility] before but stated he would be willing to do so.  Choice provided, pt reports he is willing to work with anyone available.  Pt does have PCP, Dr Inda Merlin.  Discussed equipment and pt reports he has walker at home, denies any other needs.  CSW then spoke with pt about alcohol use.  Pt reports he "only drinks every 2 or 3 days" and reports he drinks a pint of Shearon Stalls.  Pt reports he can "take it or leave it" and was not interested in any help with his drinking or any referrals to treatment providers.  Pt reports he was vaccinated, one shot only because he was quite sick after and did not want his second dose.             Expected Discharge Plan: [place] Barriers to Discharge: Continued Medical Work up   Patient Goals and CMS Choice Patient states their goals for this hospitalization and ongoing recovery are:: get back home CMS Medicare.gov Compare Post Acute Care list provided to:: Patient Choice offered to / list presented to : Patient  Expected Discharge Plan and Services Expected Discharge Plan: [place] Choice: [place] arrangements for the past 2 months: Single Family Home                                      Prior Living Arrangements/Services Living arrangements for the past 2 months: Single Family Home Lives with:: Adult Children Patient language and need for interpreter reviewed:: No Do you feel safe going back to the place where you live?: Yes      Need for Family  Participation in Patient Care: No (Comment) Care giver support system in place?: Yes (comment)   Criminal Activity/Legal Involvement Pertinent to Current Situation/Hospitalization: No - Comment as needed  Activities of Daily Living      Permission Sought/Granted Permission sought to share information with : Facility Sport and exercise psychologist, Family Supports Permission granted to share information with : Yes, Verbal Permission Granted  Share Information with NAME: Shaquill III  Permission granted to share info w AGENCY: HH  Permission granted to share info w Relationship: son     Emotional Assessment Appearance:: Appears stated age Attitude/Demeanor/Rapport: Engaged Affect (typically observed): Pleasant Orientation: : Oriented to Self, Oriented to  Time Alcohol / Substance Use: Alcohol Use Psych Involvement: No (comment)  Admission diagnosis:  Acute pancreatitis [K85.90] SOB (shortness of breath) [R06.02] Hematemesis without nausea [K92.0] Acute pancreatitis without infection or necrosis, unspecified pancreatitis type [K85.90] Patient Active Problem List   Diagnosis Date Noted  . Bronchitis 05/24/2020  . Hematemesis 05/24/2020  . AKI (acute kidney injury) ([place]) 05/24/2020  . Alcohol abuse 05/24/2020  . Hyperglycemia 05/24/2020  . Metabolic acidemia 09/40/7680  . CKD (chronic kidney disease), stage III 01/23/2019  . Leukocytosis 01/23/2019  . SIRS (systemic inflammatory response  syndrome) ([place]) 01/23/2019  . Acute pancreatitis 01/23/2019  . Lobar pneumonia ([place]) 01/23/2019  . Sepsis ([place]) 01/23/2019  . Hypertension   . Ambulatory dysfunction 03/09/2018  . Renal mass 03/09/2018  . Closed fracture of iliac crest, right, initial encounter ([place]) 03/09/2018  . Fall at home, initial encounter 03/09/2018  . Right hip pain 03/09/2018  . Fall 03/09/2018  . S/P laparoscopic cholecystectomy 08/16/2015   PCP:  Josetta Huddle, MD Pharmacy:   [facility] [place],  [place] [address] Phone: [phone] Fax: [phone]  [id number] - [place], [place] - [address] [place] [address] Phone: [phone] Fax: [phone]     Social Determinants of Health (SDOH) Interventions    Readmission Risk Interventions No flowsheet data found.

## 2020-05-27 LAB — BASIC METABOLIC PANEL
Anion gap: 8 (ref 5–15)
BUN: 24 mg/dL — ABNORMAL HIGH (ref 8–23)
CO2: 27 mmol/L (ref 22–32)
Calcium: 8.3 mg/dL — ABNORMAL LOW (ref 8.9–10.3)
Chloride: 99 mmol/L (ref 98–111)
Creatinine, Ser: 0.97 mg/dL (ref 0.61–1.24)
GFR calc Af Amer: >60 mL/min (ref >60)
GFR calc non Af Amer: >60 mL/min (ref >60)
Glucose, Bld: 146 mg/dL — ABNORMAL HIGH (ref 70–99)
Potassium: 4.1 mmol/L (ref 3.5–5.1)
Sodium: 134 mmol/L — ABNORMAL LOW (ref 135–145)

## 2020-05-27 LAB — CBC WITH DIFFERENTIAL/PLATELET
Abs Immature Granulocytes: 0.08 10*3/uL — ABNORMAL HIGH (ref 0.00–0.07)
Basophils Absolute: 0 10*3/uL (ref 0.0–0.1)
Basophils Relative: 0 %
Eosinophils Absolute: 0 10*3/uL (ref 0.0–0.5)
Eosinophils Relative: 0 %
HCT: 39.9 % (ref 39.0–52.0)
Hemoglobin: 12.9 g/dL — ABNORMAL LOW (ref 13.0–17.0)
Immature Granulocytes: 1 %
Lymphocytes Relative: 7 %
Lymphs Abs: 0.8 10*3/uL (ref 0.7–4.0)
MCH: 31.7 pg (ref 26.0–34.0)
MCHC: 32.3 g/dL (ref 30.0–36.0)
MCV: 98 fL (ref 80.0–100.0)
Monocytes Absolute: 1.1 10*3/uL — ABNORMAL HIGH (ref 0.1–1.0)
Monocytes Relative: 10 %
Neutro Abs: 9.4 10*3/uL — ABNORMAL HIGH (ref 1.7–7.7)
Neutrophils Relative %: 82 %
Platelets: 162 10*3/uL (ref 150–400)
RBC: 4.07 MIL/uL — ABNORMAL LOW (ref 4.22–5.81)
RDW: 12.8 % (ref 11.5–15.5)
WBC: 11.4 10*3/uL — ABNORMAL HIGH (ref 4.0–10.5)
nRBC: 0 % (ref 0.0–0.2)

## 2020-05-27 LAB — GLUCOSE, CAPILLARY
Glucose-Capillary: 136 mg/dL — ABNORMAL HIGH (ref 70–99)
Glucose-Capillary: 268 mg/dL — ABNORMAL HIGH (ref 70–99)

## 2020-05-27 MED ORDER — FLUTICASONE-SALMETEROL 100-50 MCG/DOSE IN AEPB: 1.0000 | INHALATION_SPRAY | Freq: Two times a day (BID) | RESPIRATORY_TRACT | 2 refills | Status: AC

## 2020-05-27 MED ORDER — SPIRIVA HANDIHALER 18 MCG IN CAPS
18.0000 ug | ORAL_CAPSULE | Freq: Every day | RESPIRATORY_TRACT | 2 refills | Status: DC
Start: 2020-05-27 — End: 2020-12-05

## 2020-05-27 MED ORDER — THIAMINE HCL 100 MG PO TABS: 100.0000 mg | ORAL_TABLET | Freq: Every day | ORAL | 0 refills | Status: DC

## 2020-05-27 MED ORDER — PANTOPRAZOLE SODIUM 40 MG PO TBEC: 40.0000 mg | DELAYED_RELEASE_TABLET | Freq: Every day | ORAL | 0 refills | Status: DC

## 2020-05-27 MED ORDER — FOLIC ACID 1 MG PO TABS: 1.0000 mg | ORAL_TABLET | Freq: Every day | ORAL | 0 refills | Status: DC

## 2020-05-27 NOTE — Progress Notes (Signed)
Patient educated on discharge instructions, belongings returned

## 2020-05-27 NOTE — TOC Transition Note (Signed)
Transition of Care [facility]) - CM/SW Discharge Note   Patient Details  Name: Ryan Tyler MRN: [id number] Date of Birth: 11-02-1927  [facility]) CM/SW Contact:  Joanne Chars, LCSW Phone Number: 05/27/2020, 12:28 PM   Clinical Narrative:   [place] arranged through [facility], will see this weekend.  Pt reports no equipment needs. TOC signing off.     Final next level of care: [place] Barriers to Discharge: Barriers Resolved   Patient Goals and CMS Choice Patient states their goals for this hospitalization and ongoing recovery are:: get back home CMS Medicare.gov Compare Post Acute Care list provided to:: Patient Choice offered to / list presented to : Patient  Discharge Placement                       Discharge Plan and Services     Post Acute Care Choice: Home Health                    HH Arranged: PT, OT, Nurse's Aide, Social Work, RN [place] Agency: [place] Date [facility] Agency Contacted: 05/27/20 Time [place]: 1228 Representative spoke with at [place]: [place]) Interventions     Readmission Risk Interventions No flowsheet data found.

## 2020-05-27 NOTE — Progress Notes (Signed)
Occupational Therapy Treatment Patient Details Name: Ryan Tyler MRN: [id number] DOB: [birth year] Today's Date: 05/27/2020    History of present illness Pt is 86 yo male with PMH of HTN, HLD, bladder CA , BPH, GERD, nephrolithiasis, cholecystectomy.  Pt presented with abdomen pain and vomiting coffee ground emesis.  Pt with hx of chronic alcohol abuse. Admitted with acute alcoholic pancreatitis, hematemesis, and AKI. While in ED pt was confused, combatitive and had a fall.   OT comments  Upon arrival pt up and in bed and agitated wanting to go home. Therapist reassured pt that there are step needed to be taken before go home safely. Pt agreed to skilled-OT session with prep activities to go home. Pt complete bed mobility with supervision and needing Min A boost for sit to stand. Pt needing min guard with sink level activity and needing min A for posterior lean with hair brushing. Pt needing min guard for ambulation with RW and returned to bed and given disposable scrubs for pt to change into upon dc. Per social CM note pt is denying SNF but accepting of [place]. If pt has 24/7 assist at home believe pt would be appropriate for [place].    Follow Up Recommendations  Home health OT;Supervision/Assistance - 24 hour    Equipment Recommendations  3 in 1 bedside commode       Precautions / Restrictions Precautions Precautions: Fall Precaution Comments: unsteady on feet and unbalanced Restrictions Weight Bearing Restrictions: No       Mobility Bed Mobility Overal bed mobility: Needs Assistance Bed Mobility: Supine to Sit     Supine to sit: Supervision     General bed mobility comments: Supervision for safety  Transfers Overall transfer level: Needs assistance Equipment used: Rolling walker (2 wheeled) Transfers: Sit to/from Stand Sit to Stand: Min assist         General transfer comment: min A to power up and cues for safe hand placement    Balance Overall balance assessment:  Needs assistance Sitting-balance support: Feet supported;No upper extremity supported Sitting balance-Leahy Scale: Good     Standing balance support: No upper extremity supported;During functional activity Standing balance-Leahy Scale: Poor Standing balance comment: required use of AD; tends to lean posteriorly                           ADL either performed or assessed with clinical judgement   ADL Overall ADL's : Needs assistance/impaired     Grooming: Wash/dry hands;Wash/dry face;Brushing hair;Standing;Minimal assistance Grooming Details (indicate cue type and reason): Pt needing min guard for sink level activities but had posterior lean with hair brushing that required min A for balance                             Functional mobility during ADLs: Minimal assistance;Rolling walker;Cueing for safety;Cueing for sequencing General ADL Comments: Pt with decreased balance, weakness, and decreased cognition.               Cognition Arousal/Alertness: Awake/alert Behavior During Therapy: WFL for tasks assessed/performed Overall Cognitive Status: No family/caregiver present to determine baseline cognitive functioning Area of Impairment: Attention;Following commands;Safety/judgement;Awareness;Problem solving;Memory                   Current Attention Level: Sustained Memory: Decreased short-term memory;Decreased recall of precautions Following Commands: Follows one step commands consistently;Follows multi-step commands inconsistently Safety/Judgement: Decreased awareness of safety;Decreased awareness of  deficits Awareness: Intellectual Problem Solving: Slow processing;Difficulty sequencing;Requires verbal cues;Requires tactile cues General Comments: Pt more aware today but still unsafe trying to get up by himself all morning. Pt agitated upon arrival but calming with therapist instruction and reassurance. Pt able to complete 1 step commands consistently  and able to sequence activities better together today.               General Comments Pt on room air - VSS. Agitated and wanting to go home    Pertinent Vitals/ Pain       Pain Assessment: No/denies pain         Frequency  Min 2X/week        Progress Toward Goals  OT Goals(current goals can now be found in the care plan section)     Acute Rehab OT Goals Patient Stated Goal: go home OT Goal Formulation: With patient  Plan Discharge plan needs to be updated       AM-PAC OT "6 Clicks" Daily Activity     Outcome Measure   Help from another person eating meals?: None Help from another person taking care of personal grooming?: A Little Help from another person toileting, which includes using toliet, bedpan, or urinal?: A Little Help from another person bathing (including washing, rinsing, drying)?: A Little Help from another person to put on and taking off regular upper body clothing?: A Little Help from another person to put on and taking off regular lower body clothing?: A Little 6 Click Score: 19    End of Session Equipment Utilized During Treatment: Gait belt;Rolling walker  OT Visit Diagnosis: Unsteadiness on feet (R26.81);Muscle weakness (generalized) (M62.81);History of falling (Z91.81);Other symptoms and signs involving cognitive function   Activity Tolerance Patient tolerated treatment well   Patient Left in bed;with call bell/phone within reach;with bed alarm set   Nurse Communication Mobility status        Time: 1583-0940 OT Time Calculation (min): 15 min  Charges:   OT General Charges $OT Visit: 1 Visit OT Treatments $Self Care/Home Management : 8-22 mins  Malijah Lietz/OTS   Kentrell Guettler 05/27/2020, 12:25 PM

## 2020-05-27 NOTE — Discharge Summary (Signed)
Physician Discharge Summary  Patient ID: Ryan Tyler MRN: [id number] DOB/AGE: 1928/08/11 86 y.o.  Admit date: 05/24/2020 Discharge date: 05/27/2020  Admission Diagnoses:  Discharge Diagnoses:  Principal Problem:   Acute pancreatitis Active Problems:   Leukocytosis   Bronchitis   Hematemesis   AKI (acute kidney injury) ([place])   Alcohol abuse   Hyperglycemia   Discharged Condition: Stable  Hospital Course: Patient is a 86 year old male with history of hypertension, hyperlipidemia, bladder cancer status post TUR BT, BPH, status post cholecystectomy, nephrolithiasis, GERD who presented with abdomen pain, vomiting which was coffee-ground in color.  Patient was a very poor historian and was confused on presentation.  He was brought from home.  Patient has chronic alcohol abuse history and drank on daily basis, mainly beer.  He lived with his son.  He had been admitted here in April with alcoholic pancreatitis.  On presentation, patient was mildly hypertensive, had mild leukocytosis, AKI with creatinine of 2.  Lipase was elevated.  Patient was admitted for the management of alcoholic pancreatitis.  Patient was managed supportively.  Patient has been optimized.   Acute alcoholic pancreatitis:  -Patient presented with abdominal pain and elevated lipase (1225) and mildly elevated liver enzymes.. -CT abdomen/pelvis showed acute pancreatitis without signs of pseudocyst.   -Patient has history of alcohol abuse. -Patient was admitted and managed supportively.    Hematemesis:  -Patient had abdominal pain, nausea and vomiting.   -There was report of large episode of dark brown emesis at home.  This could be associated with Mallory-Weiss tear with vomiting or could be from alcoholic gastritis.   -Hemoglobin remained stable.   -Protonix was continued.    Acute kidney injury:  -Likely prerenal  -kidney function improved with IV fluids.    Leukocytosis:  -Most likely reactive.    Wheezing:   -Probably has history of COPD.  He was wheezing on presentation.   -Started on steroids.   -Continue bronchodilators.    Chronic alcohol abuse:  -Drinks beer on daily basis.   -No withdrawal symptoms during the hospital stay.   -CIWA protocol was ordered on presentation.  -Thrombocytopenia most likely associated with chronic liver disease.  Hyperglycemia:  -Continue to monitor closely on discharge.   -Hemoglobin A1c of 5.9  Left renal stone/left renal cyst:  -Stable without signs of obstruction.   -Follow-up with urology as an outpatient.   -Patient also has left inguinal hernia as seen on the CT.  Stable.  Dementia/deconditioning/debility:  - Continue supportive care.    Consults: None  Significant Diagnostic Studies: Lipase on presentation was 1244.  Treatments: Patient was managed supportively.  Patient also had wheezing.  Patient was treated with oral prednisone and nebulizer treatment.  Patient will be discharged on Spiriva and Advair.  During the hospital stay, skilled nursing facility placement was recommended but patient refused.  Patient will be discharged back home with home health physical therapy, occupational therapy, skilled nursing, nurses aide and social work.  Discharge Exam: Blood pressure (!) 178/86, pulse 89, temperature 97.7 F (36.5 C), temperature source Oral, resp. rate 18, height 6' (1.829 m), weight 77.1 kg, SpO2 95 %.   Disposition: Discharge disposition: 06-Home-Health Care Svc  Discharge Instructions    Diet - low sodium heart healthy   Complete by: As directed    Increase activity slowly   Complete by: As directed      Allergies as of 05/27/2020   No Known Allergies     Medication List    STOP  taking these medications   acetaminophen 325 MG tablet Commonly known as: TYLENOL   metoprolol tartrate 50 MG tablet Commonly known as: LOPRESSOR   multivitamin with minerals Tabs tablet   Potassium 75 MG Tabs   Vitamin B Complex  Tabs     TAKE these medications   aspirin EC 81 MG tablet Take 81 mg by mouth as needed (for discomfort or headaches).   Centrum Silver 50+Men Tabs Take 1 tablet by mouth daily.   Fluticasone-Salmeterol 100-50 MCG/DOSE Aepb Commonly known as: Advair Diskus Inhale 1 puff into the lungs in the morning and at bedtime.   folic acid 1 MG tablet Commonly known as: FOLVITE Take 1 tablet (1 mg total) by mouth daily.   lisinopril-hydrochlorothiazide 20-25 MG tablet Commonly known as: ZESTORETIC Take 1 tablet by mouth every evening.   pantoprazole 40 MG tablet Commonly known as: PROTONIX Take 1 tablet (40 mg total) by mouth daily. Start taking on: May 28, 2020   Spiriva HandiHaler 18 MCG inhalation capsule Generic drug: tiotropium Place 1 capsule (18 mcg total) into inhaler and inhale daily.   thiamine 100 MG tablet Take 1 tablet (100 mg total) by mouth daily.   VITAMIN D PO Take 1 tablet by mouth daily.        SignedBonnell Public 05/27/2020, 10:47 AM

## 2020-11-20 ENCOUNTER — Inpatient Hospital Stay (HOSPITAL_COMMUNITY)
Admission: EM | Admit: 2020-11-20 | Discharge: 2020-12-05 | DRG: 177 | Disposition: A | Discharge status: Hospice | Payer: Medicare Other | Attending: Internal Medicine | Admitting: Internal Medicine

## 2020-11-20 ENCOUNTER — Encounter (HOSPITAL_COMMUNITY): Payer: Self-pay | Admitting: Emergency Medicine

## 2020-11-20 ENCOUNTER — Other Ambulatory Visit: Payer: Self-pay

## 2020-11-20 ENCOUNTER — Emergency Department (HOSPITAL_COMMUNITY): Payer: Medicare Other

## 2020-11-20 DIAGNOSIS — J690 Pneumonitis due to inhalation of food and vomit: Secondary | ICD-10-CM | POA: Diagnosis not present

## 2020-11-20 DIAGNOSIS — J1282 Pneumonia due to coronavirus disease 2019: Secondary | ICD-10-CM | POA: Diagnosis not present

## 2020-11-20 DIAGNOSIS — N183 Chronic kidney disease, stage 3 unspecified: Secondary | ICD-10-CM | POA: Diagnosis not present

## 2020-11-20 DIAGNOSIS — R0602 Shortness of breath: Secondary | ICD-10-CM

## 2020-11-20 DIAGNOSIS — F0281 Dementia in other diseases classified elsewhere with behavioral disturbance: Secondary | ICD-10-CM | POA: Diagnosis not present

## 2020-11-20 DIAGNOSIS — G9349 Other encephalopathy: Secondary | ICD-10-CM | POA: Diagnosis not present

## 2020-11-20 DIAGNOSIS — J159 Unspecified bacterial pneumonia: Secondary | ICD-10-CM | POA: Diagnosis present

## 2020-11-20 DIAGNOSIS — G309 Alzheimer's disease, unspecified: Secondary | ICD-10-CM | POA: Diagnosis not present

## 2020-11-20 DIAGNOSIS — R5381 Other malaise: Secondary | ICD-10-CM | POA: Diagnosis not present

## 2020-11-20 DIAGNOSIS — I10 Essential (primary) hypertension: Secondary | ICD-10-CM | POA: Diagnosis present

## 2020-11-20 DIAGNOSIS — F05 Delirium due to known physiological condition: Secondary | ICD-10-CM | POA: Diagnosis present

## 2020-11-20 DIAGNOSIS — Z8551 Personal history of malignant neoplasm of bladder: Secondary | ICD-10-CM | POA: Diagnosis not present

## 2020-11-20 DIAGNOSIS — I129 Hypertensive chronic kidney disease with stage 1 through stage 4 chronic kidney disease, or unspecified chronic kidney disease: Secondary | ICD-10-CM | POA: Diagnosis not present

## 2020-11-20 DIAGNOSIS — R509 Fever, unspecified: Secondary | ICD-10-CM | POA: Diagnosis not present

## 2020-11-20 DIAGNOSIS — K219 Gastro-esophageal reflux disease without esophagitis: Secondary | ICD-10-CM | POA: Diagnosis present

## 2020-11-20 DIAGNOSIS — F0391 Unspecified dementia with behavioral disturbance: Secondary | ICD-10-CM | POA: Diagnosis present

## 2020-11-20 DIAGNOSIS — N179 Acute kidney failure, unspecified: Secondary | ICD-10-CM | POA: Diagnosis not present

## 2020-11-20 DIAGNOSIS — G9341 Metabolic encephalopathy: Secondary | ICD-10-CM | POA: Diagnosis present

## 2020-11-20 DIAGNOSIS — U071 COVID-19: Secondary | ICD-10-CM | POA: Diagnosis not present

## 2020-11-20 DIAGNOSIS — N1831 Chronic kidney disease, stage 3a: Secondary | ICD-10-CM | POA: Diagnosis not present

## 2020-11-20 DIAGNOSIS — E785 Hyperlipidemia, unspecified: Secondary | ICD-10-CM | POA: Diagnosis present

## 2020-11-20 DIAGNOSIS — F10239 Alcohol dependence with withdrawal, unspecified: Secondary | ICD-10-CM | POA: Diagnosis present

## 2020-11-20 DIAGNOSIS — Z7189 Other specified counseling: Secondary | ICD-10-CM | POA: Diagnosis not present

## 2020-11-20 DIAGNOSIS — R627 Adult failure to thrive: Secondary | ICD-10-CM | POA: Diagnosis not present

## 2020-11-20 DIAGNOSIS — J9811 Atelectasis: Secondary | ICD-10-CM | POA: Diagnosis not present

## 2020-11-20 DIAGNOSIS — Z66 Do not resuscitate: Secondary | ICD-10-CM | POA: Diagnosis present

## 2020-11-20 DIAGNOSIS — Z87891 Personal history of nicotine dependence: Secondary | ICD-10-CM | POA: Diagnosis not present

## 2020-11-20 DIAGNOSIS — Z515 Encounter for palliative care: Secondary | ICD-10-CM | POA: Diagnosis not present

## 2020-11-20 DIAGNOSIS — R7989 Other specified abnormal findings of blood chemistry: Secondary | ICD-10-CM | POA: Diagnosis not present

## 2020-11-20 DIAGNOSIS — R69 Illness, unspecified: Secondary | ICD-10-CM | POA: Diagnosis not present

## 2020-11-20 DIAGNOSIS — F03918 Unspecified dementia, unspecified severity, with other behavioral disturbance: Secondary | ICD-10-CM | POA: Diagnosis present

## 2020-11-20 LAB — CBC
HCT: 52.4 % — ABNORMAL HIGH (ref 39.0–52.0)
Hemoglobin: 15.2 g/dL (ref 13.0–17.0)
MCH: 25.3 pg — ABNORMAL LOW (ref 26.0–34.0)
MCHC: 29 g/dL — ABNORMAL LOW (ref 30.0–36.0)
MCV: 87.2 fL (ref 80.0–100.0)
Platelets: 146 10*3/uL — ABNORMAL LOW (ref 150–400)
RBC: 6.01 MIL/uL — ABNORMAL HIGH (ref 4.22–5.81)
RDW: 15.9 % — ABNORMAL HIGH (ref 11.5–15.5)
WBC: 10.1 10*3/uL (ref 4.0–10.5)
nRBC: 0 % (ref 0.0–0.2)

## 2020-11-20 LAB — BASIC METABOLIC PANEL
Anion gap: 18 — ABNORMAL HIGH (ref 5–15)
BUN: 37 mg/dL — ABNORMAL HIGH (ref 8–23)
CO2: 22 mmol/L (ref 22–32)
Calcium: 8.9 mg/dL (ref 8.9–10.3)
Chloride: 99 mmol/L (ref 98–111)
Creatinine, Ser: 1.68 mg/dL — ABNORMAL HIGH (ref 0.61–1.24)
GFR, Estimated: 38 mL/min — ABNORMAL LOW (ref >60)
Glucose, Bld: 186 mg/dL — ABNORMAL HIGH (ref 70–99)
Potassium: 4.4 mmol/L (ref 3.5–5.1)
Sodium: 139 mmol/L (ref 135–145)

## 2020-11-20 LAB — LIPASE, BLOOD: Lipase: 33 U/L (ref 11–51)

## 2020-11-20 LAB — HEPATIC FUNCTION PANEL
ALT: 19 U/L (ref 0–44)
AST: 32 U/L (ref 15–41)
Albumin: 3.5 g/dL (ref 3.5–5.0)
Alkaline Phosphatase: 130 U/L — ABNORMAL HIGH (ref 38–126)
Bilirubin, Direct: 0.4 mg/dL — ABNORMAL HIGH (ref 0.0–0.2)
Indirect Bilirubin: 1.3 mg/dL — ABNORMAL HIGH (ref 0.3–0.9)
Total Bilirubin: 1.7 mg/dL — ABNORMAL HIGH (ref 0.3–1.2)
Total Protein: 6.4 g/dL — ABNORMAL LOW (ref 6.5–8.1)

## 2020-11-20 LAB — D-DIMER, QUANTITATIVE: D-Dimer, Quant: 0.92 ug/mL-FEU — ABNORMAL HIGH (ref 0.00–0.50)

## 2020-11-20 LAB — FIBRINOGEN: Fibrinogen: 417 mg/dL (ref 210–475)

## 2020-11-20 LAB — FERRITIN: Ferritin: 42 ng/mL (ref 24–336)

## 2020-11-20 LAB — C-REACTIVE PROTEIN: CRP: 5.9 mg/dL — ABNORMAL HIGH (ref <1.0)

## 2020-11-20 LAB — SARS CORONAVIRUS 2 BY RT PCR (HOSPITAL ORDER, PERFORMED IN ~~LOC~~ HOSPITAL LAB): SARS Coronavirus 2: POSITIVE — AB

## 2020-11-20 LAB — PROCALCITONIN: Procalcitonin: 4.68 ng/mL

## 2020-11-20 LAB — LACTATE DEHYDROGENASE: LDH: 138 U/L (ref 98–192)

## 2020-11-20 LAB — ETHANOL: Alcohol, Ethyl (B): <10 mg/dL (ref <10)

## 2020-11-20 MED ORDER — LACTATED RINGERS IV SOLN: INTRAVENOUS | Status: AC

## 2020-11-20 MED ORDER — ALBUTEROL SULFATE HFA 108 (90 BASE) MCG/ACT IN AERS: 2.0000 | INHALATION_SPRAY | RESPIRATORY_TRACT | Status: DC | PRN

## 2020-11-20 MED ORDER — THIAMINE HCL 100 MG PO TABS
100.0000 mg | ORAL_TABLET | Freq: Every day | ORAL | Status: DC
  Administered 2020-11-20 – 2020-11-22 (×3): 100 mg via ORAL
  Filled 2020-11-20 (×3): qty 1

## 2020-11-20 MED ORDER — SODIUM CHLORIDE 0.9% FLUSH
3.0000 mL | Freq: Two times a day (BID) | INTRAVENOUS | Status: DC
  Administered 2020-11-20 – 2020-12-05 (×28): 3 mL via INTRAVENOUS

## 2020-11-20 MED ORDER — OXYCODONE HCL 5 MG PO TABS: 5.0000 mg | ORAL_TABLET | ORAL | Status: DC | PRN

## 2020-11-20 MED ORDER — ACETAMINOPHEN 325 MG PO TABS: 650.0000 mg | ORAL_TABLET | Freq: Four times a day (QID) | ORAL | Status: DC | PRN

## 2020-11-20 MED ORDER — LORAZEPAM 1 MG PO TABS
1.0000 mg | ORAL_TABLET | ORAL | Status: DC | PRN
  Administered 2020-11-22: 1 mg via ORAL
  Filled 2020-11-20: qty 1

## 2020-11-20 MED ORDER — SODIUM CHLORIDE 0.9 % IV SOLN
100.0000 mg | Freq: Every day | INTRAVENOUS | Status: AC
  Administered 2020-11-21 – 2020-11-24 (×4): 100 mg via INTRAVENOUS
  Filled 2020-11-20 (×3): qty 20
  Filled 2020-11-20: qty 100

## 2020-11-20 MED ORDER — UMECLIDINIUM BROMIDE 62.5 MCG/INH IN AEPB
1.0000 | INHALATION_SPRAY | Freq: Every day | RESPIRATORY_TRACT | Status: DC
  Administered 2020-11-20 – 2020-11-30 (×8): 1 via RESPIRATORY_TRACT
  Filled 2020-11-20 (×2): qty 7

## 2020-11-20 MED ORDER — METHYLPREDNISOLONE SODIUM SUCC 40 MG IJ SOLR
40.0000 mg | Freq: Two times a day (BID) | INTRAMUSCULAR | Status: DC
  Administered 2020-11-20 – 2020-11-25 (×11): 40 mg via INTRAVENOUS
  Filled 2020-11-20 (×11): qty 1

## 2020-11-20 MED ORDER — PREDNISONE 20 MG PO TABS: 50.0000 mg | ORAL_TABLET | Freq: Every day | ORAL | Status: DC

## 2020-11-20 MED ORDER — BISACODYL 5 MG PO TBEC: 5.0000 mg | DELAYED_RELEASE_TABLET | Freq: Every day | ORAL | Status: DC | PRN

## 2020-11-20 MED ORDER — DOCUSATE SODIUM 100 MG PO CAPS
100.0000 mg | ORAL_CAPSULE | Freq: Two times a day (BID) | ORAL | Status: DC
  Administered 2020-11-20 – 2020-11-29 (×16): 100 mg via ORAL
  Filled 2020-11-20 (×18): qty 1

## 2020-11-20 MED ORDER — POLYETHYLENE GLYCOL 3350 17 G PO PACK: 17.0000 g | PACK | Freq: Every day | ORAL | Status: DC | PRN

## 2020-11-20 MED ORDER — HYDROCOD POLST-CPM POLST ER 10-8 MG/5ML PO SUER: 5.0000 mL | Freq: Two times a day (BID) | ORAL | Status: DC | PRN

## 2020-11-20 MED ORDER — ONDANSETRON HCL 4 MG PO TABS: 4.0000 mg | ORAL_TABLET | Freq: Four times a day (QID) | ORAL | Status: DC | PRN

## 2020-11-20 MED ORDER — SODIUM CHLORIDE 0.9 % IV SOLN
200.0000 mg | Freq: Once | INTRAVENOUS | Status: AC
  Administered 2020-11-20: 200 mg via INTRAVENOUS
  Filled 2020-11-20: qty 200

## 2020-11-20 MED ORDER — ZINC SULFATE 220 (50 ZN) MG PO CAPS
220.0000 mg | ORAL_CAPSULE | Freq: Every day | ORAL | Status: DC
  Administered 2020-11-20 – 2020-11-29 (×9): 220 mg via ORAL
  Filled 2020-11-20 (×9): qty 1

## 2020-11-20 MED ORDER — FLUTICASONE FUROATE-VILANTEROL 100-25 MCG/INH IN AEPB
1.0000 | INHALATION_SPRAY | Freq: Every day | RESPIRATORY_TRACT | Status: DC
  Administered 2020-11-20 – 2020-11-30 (×8): 1 via RESPIRATORY_TRACT
  Filled 2020-11-20 (×2): qty 28

## 2020-11-20 MED ORDER — ADULT MULTIVITAMIN W/MINERALS CH
1.0000 | ORAL_TABLET | Freq: Every day | ORAL | Status: DC
  Administered 2020-11-20 – 2020-11-29 (×9): 1 via ORAL
  Filled 2020-11-20 (×9): qty 1

## 2020-11-20 MED ORDER — ENOXAPARIN SODIUM 40 MG/0.4ML ~~LOC~~ SOLN
40.0000 mg | Freq: Every day | SUBCUTANEOUS | Status: DC
  Administered 2020-11-20 – 2020-11-29 (×10): 40 mg via SUBCUTANEOUS
  Filled 2020-11-20 (×10): qty 0.4

## 2020-11-20 MED ORDER — SODIUM CHLORIDE 0.9 % IV SOLN: 250.0000 mL | INTRAVENOUS | Status: DC | PRN

## 2020-11-20 MED ORDER — GUAIFENESIN-DM 100-10 MG/5ML PO SYRP: 10.0000 mL | ORAL_SOLUTION | ORAL | Status: DC | PRN

## 2020-11-20 MED ORDER — ASCORBIC ACID 500 MG PO TABS
500.0000 mg | ORAL_TABLET | Freq: Every day | ORAL | Status: DC
  Administered 2020-11-20 – 2020-11-29 (×9): 500 mg via ORAL
  Filled 2020-11-20 (×9): qty 1

## 2020-11-20 MED ORDER — FOLIC ACID 1 MG PO TABS
1.0000 mg | ORAL_TABLET | Freq: Every day | ORAL | Status: DC
  Administered 2020-11-20 – 2020-11-29 (×9): 1 mg via ORAL
  Filled 2020-11-20 (×9): qty 1

## 2020-11-20 MED ORDER — ONDANSETRON HCL 4 MG/2ML IJ SOLN: 4.0000 mg | Freq: Four times a day (QID) | INTRAMUSCULAR | Status: DC | PRN

## 2020-11-20 MED ORDER — SODIUM CHLORIDE 0.9% FLUSH: 3.0000 mL | INTRAVENOUS | Status: DC | PRN

## 2020-11-20 MED ORDER — LORAZEPAM 2 MG/ML IJ SOLN
1.0000 mg | INTRAMUSCULAR | Status: DC | PRN
  Administered 2020-11-20: 2 mg via INTRAVENOUS
  Administered 2020-11-21: 3 mg via INTRAVENOUS
  Administered 2020-11-22: 2 mg via INTRAVENOUS
  Filled 2020-11-20: qty 2
  Filled 2020-11-20: qty 1
  Filled 2020-11-20: qty 2
  Filled 2020-11-20: qty 1
  Filled 2020-11-20: qty 2

## 2020-11-20 MED ORDER — SODIUM CHLORIDE 0.9% FLUSH
3.0000 mL | Freq: Two times a day (BID) | INTRAVENOUS | Status: DC
  Administered 2020-11-20 – 2020-12-04 (×21): 3 mL via INTRAVENOUS

## 2020-11-20 MED ORDER — FLEET ENEMA 7-19 GM/118ML RE ENEM: 1.0000 | ENEMA | Freq: Once | RECTAL | Status: DC | PRN

## 2020-11-20 MED ORDER — PANTOPRAZOLE SODIUM 40 MG PO TBEC
40.0000 mg | DELAYED_RELEASE_TABLET | Freq: Every day | ORAL | Status: DC
Start: 2020-11-20 — End: 2020-12-06
  Administered 2020-11-20 – 2020-12-01 (×11): 40 mg via ORAL
  Filled 2020-11-20 (×13): qty 1

## 2020-11-20 MED ORDER — LACTATED RINGERS IV BOLUS
1000.0000 mL | Freq: Once | INTRAVENOUS | Status: AC
  Administered 2020-11-20: 1000 mL via INTRAVENOUS

## 2020-11-20 NOTE — Progress Notes (Signed)
Patient noted to have EKG changes on telemetry.  EKG confirms that he is now in bigeminy.  Will continue to follow.  He is DNR.  Repeat EKG in AM.  Monitoring on telemetry.  Carlyon Shadow, M.D.

## 2020-11-20 NOTE — ED Provider Notes (Signed)
[facility] EMERGENCY DEPARTMENT Provider Note   CSN: [id number] Arrival date & time: 11/20/20  0252     History Chief Complaint  Patient presents with  . Fever   Level 5 caveat due to altered mental status Ryan Tyler is a 87 y.o. male.  The history is provided by the patient. The history is limited by the condition of the patient.  Weakness Severity:  Moderate Onset quality:  Gradual Timing:  Constant Progression:  Unchanged Relieved by:  Nothing Worsened by:  Nothing Associated symptoms: fever   Patient with history hypertension, hyperlipidemia, alcohol abuse presents with fever, generalized weakness and decreased appetite.  It reported this is been ongoing for up to a week. Per EMS, patient is altered at baseline.  Patient denies any complaints at this time     Past Medical History:  Diagnosis Date  . Asymptomatic cholelithiasis   . Bladder tumor   . BPH (benign prostatic hypertrophy)   . Cancer ([place])    bladder  . GERD (gastroesophageal reflux disease)   . Hearing loss   . HOH (hard of hearing)    no hearing aids  . Hyperlipidemia   . Hypertension   . Kidney stones   . Mild left inguinal hernia   . Nocturia   . Urgency of urination     Patient Active Problem List   Diagnosis Date Noted  . Bronchitis 05/24/2020  . Hematemesis 05/24/2020  . AKI (acute kidney injury) ([place]) 05/24/2020  . Alcohol abuse 05/24/2020  . Hyperglycemia 05/24/2020  . Metabolic acidemia 09/98/3382  . CKD (chronic kidney disease), stage III ([place]) 01/23/2019  . Leukocytosis 01/23/2019  . SIRS (systemic inflammatory response syndrome) ([place]) 01/23/2019  . Acute pancreatitis 01/23/2019  . Lobar pneumonia ([place]) 01/23/2019  . Sepsis ([place]) 01/23/2019  . Hypertension   . Ambulatory dysfunction 03/09/2018  . Renal mass 03/09/2018  . Closed fracture of iliac crest, right, initial encounter ([place]) 03/09/2018  . Fall at home, initial encounter 03/09/2018  . Right  hip pain 03/09/2018  . Fall 03/09/2018  . S/P laparoscopic cholecystectomy 08/16/2015    Past Surgical History:  Procedure Laterality Date  . CATARACT EXTRACTION W/ INTRAOCULAR LENS IMPLANT Left nov  2014  . CHOLECYSTECTOMY N/A 08/16/2015   Procedure: LAPAROSCOPIC CHOLECYSTECTOMY;  Surgeon: Rolm Bookbinder, MD;  Location: [facility];  Service: General;  Laterality: N/A;  . TONSILLECTOMY    . TRANSURETHRAL RESECTION OF BLADDER TUMOR WITH GYRUS (TURBT-GYRUS) N/A 04/05/2014   Procedure: TRANSURETHRAL RESECTION OF BLADDER TUMOR WITH GYRUS  AND INTRAVESICO CHEMO;  Surgeon: Claybon Jabs, MD;  Location[facility];  Service: Urology;  Laterality: N/A;  . TRANSURETHRAL RESECTION OF PROSTATE  1995       Family History  Problem Relation Age of Onset  . Cancer Mother   . Leukemia Other     Social History   Tobacco Use  . Smoking status: Never Smoker  . Smokeless tobacco: Former Systems developer    Types: Chew  Substance Use Topics  . Alcohol use: Yes    Comment: 1 pint liquor most days.    . Drug use: No    Home Medications Prior to Admission medications   Medication Sig Start Date End Date Taking? Authorizing Provider  aspirin EC 81 MG tablet Take 81 mg by mouth as needed (for discomfort or headaches).     [provider]  Fluticasone-Salmeterol (ADVAIR DISKUS) 100-50 MCG/DOSE AEPB Inhale 1 puff into the lungs in the morning and  at bedtime. 05/27/20 05/27/21  Dana Allan I, MD  folic acid (FOLVITE) 1 MG tablet Take 1 tablet (1 mg total) by mouth daily. 05/27/20   Bonnell Public, MD  lisinopril-hydrochlorothiazide (ZESTORETIC) 20-25 MG tablet Take 1 tablet by mouth every evening.  02/09/20   [provider]  Multiple Vitamins-Minerals (CENTRUM SILVER 50+MEN) TABS Take 1 tablet by mouth daily.    [provider]  pantoprazole (PROTONIX) 40 MG tablet Take 1 tablet (40 mg total) by mouth daily. 05/28/20   Dana Allan I, MD  thiamine 100 MG tablet  Take 1 tablet (100 mg total) by mouth daily. 05/27/20   Bonnell Public, MD  tiotropium (SPIRIVA HANDIHALER) 18 MCG inhalation capsule Place 1 capsule (18 mcg total) into inhaler and inhale daily. 05/27/20 05/27/21  Dana Allan I, MD  VITAMIN D PO Take 1 tablet by mouth daily.    [provider]    Allergies    Patient has no known allergies.  Review of Systems   Review of Systems  Unable to perform ROS: Mental status change  Constitutional: Positive for fever.  Neurological: Positive for weakness.    Physical Exam Updated Vital Signs BP (!) 144/87 (BP Location: Right Arm)   Pulse (!) 139   Temp 98.7 F (37.1 C) (Oral)   Resp 19   SpO2 94%   Physical Exam CONSTITUTIONAL: Elderly, chronically ill-appearing HEAD: Normocephalic/atraumatic EYES: EOMI/PERRL ENMT: Mask in place NECK: supple no meningeal signs SPINE/BACK:entire spine nontender CV: S1/S2 noted, tachycardic LUNGS: Tachypnea is noted.  Crackles noted in right base. ABDOMEN: soft, nontender, no rebound or guarding, bowel sounds noted throughout abdomen GU:no cva tenderness NEURO: Pt is awake/alert,moves all extremitiesx4.  No facial droop.  Patient follows commands, but does not answer all questions appropriately. EXTREMITIES: pulses normal/equal, full ROM,, no visible trauma or deformities SKIN: warm, color normal PSYCH: Unable to assess  ED Results / Procedures / Treatments   Labs (all labs ordered are listed, but only abnormal results are displayed) Labs Reviewed  SARS CORONAVIRUS 2 BY RT PCR (HOSPITAL ORDER, [facility]) - Abnormal; Notable for the following components:      Result Value   SARS Coronavirus 2 POSITIVE (*)    All other components within normal limits  CBC - Abnormal; Notable for the following components:   RBC 6.01 (*)    HCT 52.4 (*)    MCH 25.3 (*)    MCHC 29.0 (*)    RDW 15.9 (*)    Platelets 146 (*)    All other components within normal limits   BASIC METABOLIC PANEL - Abnormal; Notable for the following components:   Glucose, Bld 186 (*)    BUN 37 (*)    Creatinine, Ser 1.68 (*)    GFR, Estimated 38 (*)    Anion gap 18 (*)    All other components within normal limits  HEPATIC FUNCTION PANEL  LIPASE, BLOOD  URINALYSIS, ROUTINE W REFLEX MICROSCOPIC  ETHANOL    EKG EKG Interpretation  Date/Time:  Sunday November 20 2020 06:20:29 EST Ventricular Rate:  113 PR Interval:  196 QRS Duration: 126 QT Interval:  341 QTC Calculation: 468 R Axis:   -87 Text Interpretation: Sinus or ectopic atrial tachycardia Right bundle branch block Confirmed by Ripley Fraise [phone]) on 11/20/2020 6:24:54 AM   Radiology DG Chest Portable 1 View  Result Date: 11/20/2020 CLINICAL DATA:  Shortness of breath and fevers EXAM: PORTABLE CHEST 1 VIEW COMPARISON:  05/24/2020  FINDINGS: Cardiac shadow is stable. Aortic calcifications are noted. Left lung is clear. Right lung demonstrates some upper lobe atelectatic changes. No bony abnormality is seen. IMPRESSION: Mild right lung atelectatic changes. Electronically Signed   By: Inez Catalina M.D.   On: 11/20/2020 03:18    Procedures Procedures   Medications Ordered in ED Medications  remdesivir 200 mg in sodium chloride 0.9% 250 mL IVPB (has no administration in time range)    Followed by  remdesivir 100 mg in sodium chloride 0.9 % 100 mL IVPB (has no administration in time range)  lactated ringers bolus 1,000 mL (1,000 mLs Intravenous New Bag/Given 11/20/20 3016)    ED Course  I have reviewed the triage vital signs and the nursing notes.  Pertinent labs & imaging results that were available during my care of the patient were reviewed by me and considered in my medical decision making (see chart for details).    MDM Rules/Calculators/A&P                          6:28 AM Patient presents from home with fever, generalized weakness and decreased appetite.  Patient was found to have COVID-19.   Patient currently afebrile, but tachycardic.  He also appears tachypneic. Per labs, patient does appear more dehydrated. I am unable to contact his son via phone. IV fluids and remdesivir have been ordered 7:24 AM Patient will be admitted for COVID-19, dehydration/aki and tachypnea.  He currently does not have a oxygen requirement.  Unclear what his baseline mental status is those reported he does have dementia.  I am unable to contact his son.  Discussed with Dr. Lorin Mercy for admission  Ryan Tyler was evaluated in Emergency Department on 11/20/2020 for the symptoms described in the history of present illness. He was evaluated in the context of the global COVID-19 pandemic, which necessitated consideration that the patient might be at risk for infection with the SARS-CoV-2 virus that causes COVID-19. Institutional protocols and algorithms that pertain to the evaluation of patients at risk for COVID-19 are in a state of rapid change based on information released by regulatory bodies including the CDC and federal and state organizations. These policies and algorithms were followed during the patient's care in the ED.   Final Clinical Impression(s) / ED Diagnoses Final diagnoses:  COVID-19  AKI (acute kidney injury) [facility])    Rx / [place] Orders ED Discharge Orders    None       Ripley Fraise, MD 11/20/20 [phone]

## 2020-11-20 NOTE — ED Triage Notes (Signed)
Pt presents to ED BIB [facility] from home. Pt c/o fever, decreased appetite x1w. Pt altered at baseline. Son reports seeing bedbugs. EMS VS - 150/90 HR -120 palp Temp - 101

## 2020-11-20 NOTE — H&P (Addendum)
History and Physical    ALESANDRO STUEVE GUR:[id number] DOB: 12-Dec-1927 DOA: 11/20/2020  PCP: Josetta Huddle, MD Consultants:  Karsten Ro - urology Patient coming from:  Home - lives with ; NOK: [place], [place], [phone]; [phone] (patient's cell phone)  Chief Complaint: Fever  HPI: Ryan Tyler is a 87 y.o. male with medical history significant of HTN; HLD; ETOH dependence with h/o alcoholic pancreatitis; dementia; and bladder cancer s/p TURBT presenting with fever.  I was unable to reach family at the time of admission and so history is obtained from the chart only, as the patient is unable to provide history.  Per triage note, EMS brought in the patient due to fever, anorexia x 1 week.  His son was concerned about bedbugs, as well.  Chronically AMS, uncertain how much is new today.  It is not clear whether the patient has continued to drink alcohol.  He was last admitted from 4/8-5 for alcoholic pancreatitis.  Notes indicate that he was confused and a very poor historian.  He was apparently able to localize his abdominal pain but otherwise unable to answer questions well.  He was recommended for SNF but declined and preferred [facility] with 24 hour assistance at home.    His son did call back.  [place].  He has noticed that he has been "hoarding his meals."  There is plenty of food in the house but he was not eating properly.  He was staying in bed nonstop.  People were checking on him.  He was incoherent.  He started shaking so bad this AM that he decided to call 911.  Fever up to 102.  At baseline, he is very hard of hearing but can do anything he wants.  He still drinks - a couple of drinks a day, a "hot toddy".  [name] "are drinkers."  He rents a property, "will not leave this house because my mom died in this house", has had bedbugs since a renter moved into the back of the property.      ED Course: Fever and poor appetite for a week.   Tachypnea, tachycardia, AKI. ?baseline mental status.  Confused.  Unable to reach family.  No current O2 requirement.  Review of Systems: Unable to perform    COVID Vaccine Status:  None - and his son doesn't want him to be vaccinated  Past Medical History:  Diagnosis Date  . Asymptomatic cholelithiasis   . Bladder tumor   . BPH (benign prostatic hypertrophy)   . Cancer ([place])    bladder  . GERD (gastroesophageal reflux disease)   . Hearing loss   . HOH (hard of hearing)    no hearing aids  . Hyperlipidemia   . Hypertension   . Kidney stones   . Mild left inguinal hernia   . Nocturia   . Urgency of urination     Past Surgical History:  Procedure Laterality Date  . CATARACT EXTRACTION W/ INTRAOCULAR LENS IMPLANT Left nov  2014  . CHOLECYSTECTOMY N/A 08/16/2015   Procedure: LAPAROSCOPIC CHOLECYSTECTOMY;  Surgeon: Rolm Bookbinder, MD;  Location: [facility];  Service: General;  Laterality: N/A;  . TONSILLECTOMY    . TRANSURETHRAL RESECTION OF BLADDER TUMOR WITH GYRUS (TURBT-GYRUS) N/A 04/05/2014   Procedure: TRANSURETHRAL RESECTION OF BLADDER TUMOR WITH GYRUS  AND INTRAVESICO CHEMO;  Surgeon: Claybon Jabs, MD;  Location: [facility];  Service: Urology;  Laterality: N/A;  .  TRANSURETHRAL RESECTION OF PROSTATE  1995    Social History   Socioeconomic History  . Marital status: Widowed    Spouse name: Not on file  . Number of children: Not on file  . Years of education: Not on file  . Highest education level: Not on file  Occupational History  . Not on file  Tobacco Use  . Smoking status: Never Smoker  . Smokeless tobacco: Former Systems developer    Types: Chew  Substance and Sexual Activity  . Alcohol use: Yes    Comment: 1 pint liquor most days.    . Drug use: No  . Sexual activity: Not on file  Other Topics Concern  . Not on file  Social History Narrative  . Not on file   Social Determinants of Health   Financial Resource Strain: Not on file  Food  Insecurity: Not on file  Transportation Needs: Not on file  Physical Activity: Not on file  Stress: Not on file  Social Connections: Not on file  Intimate Partner Violence: Not on file    No Known Allergies  Family History  Problem Relation Age of Onset  . Cancer Mother   . Leukemia Other     Prior to Admission medications   Medication Sig Start Date End Date Taking? Authorizing Provider  aspirin EC 81 MG tablet Take 81 mg by mouth as needed (for discomfort or headaches).     [provider]  Fluticasone-Salmeterol (ADVAIR DISKUS) 100-50 MCG/DOSE AEPB Inhale 1 puff into the lungs in the morning and at bedtime. 05/27/20 05/27/21  Dana Allan I, MD  folic acid (FOLVITE) 1 MG tablet Take 1 tablet (1 mg total) by mouth daily. 05/27/20   Bonnell Public, MD  lisinopril-hydrochlorothiazide (ZESTORETIC) 20-25 MG tablet Take 1 tablet by mouth every evening.  02/09/20   [provider]  Multiple Vitamins-Minerals (CENTRUM SILVER 50+MEN) TABS Take 1 tablet by mouth daily.    [provider]  pantoprazole (PROTONIX) 40 MG tablet Take 1 tablet (40 mg total) by mouth daily. 05/28/20   Dana Allan I, MD  thiamine 100 MG tablet Take 1 tablet (100 mg total) by mouth daily. 05/27/20   Bonnell Public, MD  tiotropium (SPIRIVA HANDIHALER) 18 MCG inhalation capsule Place 1 capsule (18 mcg total) into inhaler and inhale daily. 05/27/20 05/27/21  Dana Allan I, MD  VITAMIN D PO Take 1 tablet by mouth daily.    [provider]    Physical Exam: Vitals:   11/20/20 0307 11/20/20 0630 11/20/20 0734  BP: (!) 144/87 110/68 130/79  Pulse: (!) 139 (!) 106 94  Resp: 19 (!) 29 (!) 28  Temp: 98.7 F (37.1 C)  100.1 F (37.8 C)  TempSrc: Oral  Oral  SpO2: 94% 93% 93%     . General:  Alert, a few monosyllabic words but generally nonverbal, does not follow commands.  No bed bugs visualized. . Eyes:  PERRL, EOMI, normal lids, iris . ENT:  grossly normal hearing,  lips & tongue, dry mm . Neck:  no LAD, masses or thyromegaly . Cardiovascular:  RR with mild tachycardia, no m/r/g. No LE edema.  Marland Kitchen Respiratory:   CTA bilaterally with no wheezes/rales/rhonchi.  Mildly increased respiratory effort but 93% on RA. Marland Kitchen Abdomen:  soft, NT, ND, NABS . Skin:  no rash or induration seen on limited exam . Musculoskeletal:  grossly normal tone BUE/BLE, good ROM, no bony abnormality . Lower extremity:  No LE edema.  Limited foot  exam with no ulcerations but poor hygiene, significant foot fungus.  2+ distal pulses. Marland Kitchen Psychiatric:  Alert but mostly nonverbal . Neurologic:  Unable to perfrom    Radiological Exams on Admission: Independently reviewed - see discussion in A/P where applicable  DG Chest Portable 1 View  Result Date: 11/20/2020 CLINICAL DATA:  Shortness of breath and fevers EXAM: PORTABLE CHEST 1 VIEW COMPARISON:  05/24/2020 FINDINGS: Cardiac shadow is stable. Aortic calcifications are noted. Left lung is clear. Right lung demonstrates some upper lobe atelectatic changes. No bony abnormality is seen. IMPRESSION: Mild right lung atelectatic changes. Electronically Signed   By: Inez Catalina M.D.   On: 11/20/2020 03:18    EKG: Independently reviewed.   0423 - Sinus tachycardia with rate 126, PACs, artifact, possible acute ischemia 0620 - Sinus tachycardia with rate 113; RBBB without apparent ischemic changes  Labs on Admission: I have personally reviewed the available labs and imaging studies at the time of the admission.  Pertinent labs:   Glucose 186 BUN 37/Creatinine 1.68/GFR 38; 24/0.97/>60 on 05/27/20 WBC 10.1 Platelets 146 Bili 1.7 COVID POSITIVE ETOH <10   Assessment/Plan Principal Problem:   Encephalopathy due to COVID-19 virus Active Problems:   Hypertension   CKD (chronic kidney disease), stage III (HCC)   AKI (acute kidney injury) ([place])   Dementia with behavioral disturbance (HCC)   DNR (do not resuscitate)   History of bladder  cancer   Encephalopathy due to COVID-19 Infection -Patient with presenting with reported fever and anorexia x 1 week -He does not have a current O2 requirement but does have tachypnea -He has baseline cognitive impairment but may be more altered than baseline -COVID POSITIVE -The patient has comorbidities which may increase the risk for ARDS/MODS including: age, HTN, CKD -Pertinent labs concerning for COVID include increased BUN/Creatinine; increased bilirubin; other COVID labs are pending -CXR currently unremarkable  -Will not treat with broad-spectrum antibiotics if procalcitonin <0.5 -Will admit for further evaluation, close monitoring, and treatment -Monitor on telemetry x at least 24 hours -At this time, will attempt to avoid use of aerosolized medications and use HFAs instead -Will check daily labs including BMP with Mag, Phos; LFTs; CBC with differential; CRP; ferritin; fibrinogen; D-dimer -Will order steroids and Remdesivir (pharmacy consult) given +COVID test and hypoxia <94% on room air -If the patient shows clinical deterioration, consider transfer to ICU with PCCM consultation -Consider IL-6 agonist (Actemra) and/or JAK inhibitor (baricitinib) if the patient does not stabilize on current treatment or if the patient has marked clinical decompensation; the patient does not appear to require this treatment at this time -Will attempt to maintain euvolemia to a net negative fluid status; he is currently volume deficient -PT/OT consults -Encourage mobilization/ambulation as much as possible -Patient was seen wearing full PPE including: gown, gloves, head cover, N95, and face shield; donning and doffing was in compliance with current standards.  AKI on stage 3a CKD -Baseline GFR about 60, currently 38 -Will provide LR infusion at 100 cc/hr x 20 hours and then stop -Recheck BMP in AM -Hold Zestoretic  Dementia -Uncertain baseline  -Prior notes indicate that patient was able to  localize pain -He is currently mostly nonverbal with a few monosyllabic words -He does not currently follow commands  ETOH dependence -Patient with most recent admission in [id number] for alcoholic pancreatitis -It is not clear whether the patient has continued to drink at home -Will place on CIWA protocol for now -Continue folate, thiamine, MVI  HTN -Hold Zestoretic given AKI  H/o bladder cancer -2015 transurethral resection of bladder tumor with gyrus and intravesico chemo bu Dr. Karsten Ro -Pathology with low-grade papillary urothelial carcinoma without apparent spread -It is not apparent whether the patient has had urology f/u since -Would suggest outpatient f/u  Possible bedbugs -Not obviously visualized here, but son reported concern for this  -Nursing staff is aware so that patient can be decontaminated  DNR -Palliative care note from 01/30/19 reports discussion with the patient's son with clear desire for DNR/DNI.   -Based on previously stated desire for no resuscitation and the patient's age (87yo) and overall condition (acute vs. Chronic dementia, COVID infection), will continue DNR. -His son doesn't want him to suffer.    Level of care: Telemetry Medical DVT prophylaxis:  Lovenox  Code Status:  DNR - based on palliative care note from 01/2019 and discussion with his son Family Communication: None present; I was able to discuss the patient by telephone at the time of admission Disposition Plan:  The patient is from: home  Anticipated d/c is to: home with [facility] services vs. SNF  Anticipated d/c date will depend on clinical response to treatment, likely between 3 days (with completion of outpatient Remdesivir treatment) and 5 days  Patient is currently: acutely ill Consults called: PT/OT; [facility] team  Admission status: Admit - It is my clinical opinion that admission to [facility] is reasonable and necessary because of the expectation that this patient will require hospital care that  crosses at least 2 midnights to treat this condition based on the medical complexity of the problems presented.  Given the aforementioned information, the predictability of an adverse outcome is felt to be significant.      Karmen Bongo MD [facility]   How to contact the [facility] Attending or Consulting provider [place] or covering provider during after hours [place], for this patient?  1. Check the care team in [facility] and look for a) attending/consulting TRH provider listed and b) the [facility] team listed 2. Log into [email or web address] and use [place]'s universal password to access. If you do not have the password, please contact the hospital operator. 3. Locate the [facility] provider you are looking for under [facility] and page to a number that you can be directly reached. 4. If you still have difficulty reaching the provider, please page the [facility] (Director on Call) for the Hospitalists listed on amion for assistance.   11/20/2020, 8:27 AM

## 2020-11-21 DIAGNOSIS — N183 Chronic kidney disease, stage 3 unspecified: Secondary | ICD-10-CM | POA: Diagnosis not present

## 2020-11-21 DIAGNOSIS — N179 Acute kidney failure, unspecified: Secondary | ICD-10-CM | POA: Diagnosis not present

## 2020-11-21 DIAGNOSIS — U071 COVID-19: Secondary | ICD-10-CM | POA: Diagnosis not present

## 2020-11-21 DIAGNOSIS — G9349 Other encephalopathy: Secondary | ICD-10-CM | POA: Diagnosis not present

## 2020-11-21 LAB — CBC WITH DIFFERENTIAL/PLATELET
Abs Immature Granulocytes: 0.06 10*3/uL (ref 0.00–0.07)
Basophils Absolute: 0 10*3/uL (ref 0.0–0.1)
Basophils Relative: 0 %
Eosinophils Absolute: 0 10*3/uL (ref 0.0–0.5)
Eosinophils Relative: 0 %
HCT: 38.9 % — ABNORMAL LOW (ref 39.0–52.0)
Hemoglobin: 12.7 g/dL — ABNORMAL LOW (ref 13.0–17.0)
Immature Granulocytes: 1 %
Lymphocytes Relative: 4 %
Lymphs Abs: 0.3 10*3/uL — ABNORMAL LOW (ref 0.7–4.0)
MCH: 26.7 pg (ref 26.0–34.0)
MCHC: 32.6 g/dL (ref 30.0–36.0)
MCV: 81.9 fL (ref 80.0–100.0)
Monocytes Absolute: 0.2 10*3/uL (ref 0.1–1.0)
Monocytes Relative: 3 %
Neutro Abs: 6.6 10*3/uL (ref 1.7–7.7)
Neutrophils Relative %: 92 %
Platelets: 111 10*3/uL — ABNORMAL LOW (ref 150–400)
RBC: 4.75 MIL/uL (ref 4.22–5.81)
RDW: 15.9 % — ABNORMAL HIGH (ref 11.5–15.5)
WBC: 7.1 10*3/uL (ref 4.0–10.5)
nRBC: 0 % (ref 0.0–0.2)

## 2020-11-21 LAB — COMPREHENSIVE METABOLIC PANEL
ALT: 15 U/L (ref 0–44)
AST: 23 U/L (ref 15–41)
Albumin: 2.5 g/dL — ABNORMAL LOW (ref 3.5–5.0)
Alkaline Phosphatase: 92 U/L (ref 38–126)
Anion gap: 11 (ref 5–15)
BUN: 43 mg/dL — ABNORMAL HIGH (ref 8–23)
CO2: 22 mmol/L (ref 22–32)
Calcium: 8.6 mg/dL — ABNORMAL LOW (ref 8.9–10.3)
Chloride: 104 mmol/L (ref 98–111)
Creatinine, Ser: 1.3 mg/dL — ABNORMAL HIGH (ref 0.61–1.24)
GFR, Estimated: 52 mL/min — ABNORMAL LOW (ref >60)
Glucose, Bld: 187 mg/dL — ABNORMAL HIGH (ref 70–99)
Potassium: 4.2 mmol/L (ref 3.5–5.1)
Sodium: 137 mmol/L (ref 135–145)
Total Bilirubin: 0.9 mg/dL (ref 0.3–1.2)
Total Protein: 5 g/dL — ABNORMAL LOW (ref 6.5–8.1)

## 2020-11-21 LAB — MAGNESIUM: Magnesium: 1.6 mg/dL — ABNORMAL LOW (ref 1.7–2.4)

## 2020-11-21 LAB — FERRITIN: Ferritin: 46 ng/mL (ref 24–336)

## 2020-11-21 LAB — D-DIMER, QUANTITATIVE: D-Dimer, Quant: 1.46 ug/mL-FEU — ABNORMAL HIGH (ref 0.00–0.50)

## 2020-11-21 LAB — C-REACTIVE PROTEIN: CRP: 12.3 mg/dL — ABNORMAL HIGH (ref <1.0)

## 2020-11-21 LAB — PHOSPHORUS: Phosphorus: 2.9 mg/dL (ref 2.5–4.6)

## 2020-11-21 NOTE — Evaluation (Signed)
Occupational Therapy Evaluation Patient Details Name: Ryan Tyler MRN: [id number] DOB: 21-May-1928 Today's Date: 11/21/2020    History of Present Illness Ryan Tyler is a 87 y.o. male with medical history significant of HTN; HLD; ETOH dependence with h/o alcoholic pancreatitis; dementia; and bladder cancer s/p TURBT presenting with fever. Per triage note, EMS brought in the patient due to fever, anorexia x 1 week.  His son was concerned about bedbugs.  Chronically AMS, uncertain how much is new today.  It is not clear whether the patient has continued to drink alcohol.   Clinical Impression   Pt PTA: Pt from home and PLOF from chart as pt HOH and unable to read lips through PPE. Pt currently, ModA overall for ADL and minA+2 for mobility. Pt following commands minimally and mostly gestures due to pt HOH. Pt's O2 poor pleth but reading 86% with exertion and >90% at EOB and in supine to conclude session. Pt would benefit from continued OT skilled services for ADL, mobility and safety in [facility] setting with 24/7 assist. OT following acutely.    Follow Up Recommendations  Home health OT;Supervision/Assistance - 24 hour (may need post acute rehab if family/caregivers unable to provide 24/7)    Equipment Recommendations  3 in 1 bedside commode    Recommendations for Other Services       Precautions / Restrictions Precautions Precautions: Fall;Other (comment) Precaution Comments: COVID+ Restrictions Weight Bearing Restrictions: No      Mobility Bed Mobility Overal bed mobility: Modified Independent                  Transfers Overall transfer level: Needs assistance Equipment used: 2 person hand held assist Transfers: Sit to/from Stand Sit to Stand: Min assist;+2 safety/equipment         General transfer comment: Difficult to communicate with PPE on and pt HOH so with gestures, pt able to finally start to stand.    Balance Overall balance assessment: Needs  assistance Sitting-balance support: Feet supported Sitting balance-Leahy Scale: Good     Standing balance support: Bilateral upper extremity supported;During functional activity Standing balance-Leahy Scale: Fair Standing balance comment: requires B UE support for safety and balance.                           ADL either performed or assessed with clinical judgement   ADL Overall ADL's : Needs assistance/impaired Eating/Feeding: Set up;Sitting   Grooming: Min guard;Standing   Upper Body Bathing: Minimal assistance;Sitting   Lower Body Bathing: Moderate assistance;Sitting/lateral leans;Sit to/from stand;Cueing for safety   Upper Body Dressing : Minimal assistance;Sitting   Lower Body Dressing: Moderate assistance;Sitting/lateral leans;Sit to/from stand   Toilet Transfer: Minimal assistance;+2 for physical assistance;Ambulation;BSC   Toileting- Clothing Manipulation and Hygiene: Moderate assistance;Sitting/lateral lean;Sit to/from stand       Functional mobility during ADLs: Minimal assistance;+2 for safety/equipment;Cueing for safety;Rolling walker General ADL Comments: Pt limited by decreased strength, decreased ability to care for self and decreased safety. pt very HOH and dificult to communicate with PPE.     Vision Baseline Vision/History: Wears glasses Wears Glasses: At all times Patient Visual Report: No change from baseline Vision Assessment?: No apparent visual deficits     Perception     Praxis      Pertinent Vitals/Pain Pain Assessment: 0-10 Pain Score: 3  Pain Location: bottom Pain Descriptors / Indicators: Discomfort Pain Intervention(s): Repositioned     Hand Dominance Right   Extremity/Trunk Assessment  Upper Extremity Assessment Upper Extremity Assessment: Generalized weakness   Lower Extremity Assessment Lower Extremity Assessment: Generalized weakness;Defer to PT evaluation   Cervical / Trunk Assessment Cervical / Trunk  Assessment: Normal   Communication Communication Communication: HOH   Cognition Arousal/Alertness: Awake/alert Behavior During Therapy: WFL for tasks assessed/performed Overall Cognitive Status: Difficult to assess                                 General Comments: difficult to assess due to profound hearing loss   General Comments  O2 hard to read and poor pleth at times 86% O2 lowest seen and >90% O2 for majority of session and when pt returned to supine.    Exercises     Shoulder Instructions      Home Living Family/patient expects to be discharged to:: Private residence Living Arrangements: Alone Available Help at Discharge: Family;Available PRN/intermittently Type of Home: House Home Access: Stairs to enter [employer] of Steps: 5 Entrance Stairs-Rails: Right;Left Home Layout: One level     Bathroom Shower/Tub: Teacher, early years/pre: Standard     Home Equipment: Environmental consultant - 2 wheels   Additional Comments: Pt unable to provide PLOF or home situation and family not present.  Information taken from prior admission. Pt reporting he uses RW at home.      Prior Functioning/Environment Level of Independence: Needs assistance  Gait / Transfers Assistance Needed: Pt utilizes RW for mobility     Comments: Patient unable to provide history        OT Problem List: Decreased strength;Decreased activity tolerance;Impaired balance (sitting and/or standing);Decreased safety awareness;Decreased cognition;Cardiopulmonary status limiting activity;Pain;Increased edema      OT Treatment/Interventions: Self-care/ADL training;Therapeutic exercise;Energy conservation;DME and/or AE instruction;Therapeutic activities;Cognitive remediation/compensation;Patient/family education;Balance training    OT Goals(Current goals can be found in the care plan section) Acute Rehab OT Goals Patient Stated Goal: none stated OT Goal Formulation: With patient Time  For Goal Achievement: 12/05/20 Potential to Achieve Goals: Good ADL Goals Pt Will Perform Lower Body Dressing: with supervision;sit to/from stand;sitting/lateral leans Pt Will Transfer to Toilet: with supervision;ambulating;regular height toilet Pt Will Perform Toileting - Clothing Manipulation and hygiene: with supervision;sitting/lateral leans;sit to/from stand Pt/caregiver will Perform Home Exercise Program: Increased strength;Both right and left upper extremity Additional ADL Goal #1: Pt will increase to sit increase standing tolerance x5 mins for OOB ADL to increase independence.  OT Frequency: Min 2X/week   Barriers to D/Tyler:            Co-evaluation PT/OT/SLP Co-Evaluation/Treatment: Yes Reason for Co-Treatment: Complexity of the patient's impairments (multi-system involvement)   OT goals addressed during session: ADL's and self-care      AM-PAC OT "6 Clicks" Daily Activity     Outcome Measure Help from another person eating meals?: A Little Help from another person taking care of personal grooming?: A Little Help from another person toileting, which includes using toliet, bedpan, or urinal?: A Lot Help from another person bathing (including washing, rinsing, drying)?: A Lot Help from another person to put on and taking off regular upper body clothing?: A Little Help from another person to put on and taking off regular lower body clothing?: A Lot 6 Click Score: 15   End of Session Nurse Communication: Mobility status  Activity Tolerance: Patient tolerated treatment well Patient left: in bed;with call bell/phone within reach;with bed alarm set  OT Visit Diagnosis: Unsteadiness on feet (R26.81);Muscle weakness (generalized) (  M62.81)                TimeQN:[id number] OT Time Calculation (min): 15 min Charges:  OT General Charges $OT Visit: 1 Visit OT Evaluation $OT Eval Moderate Complexity: 1 Mod  Ryan Tyler, OTR/L Acute Rehabilitation Services Pager:  [phone] Office: [phone]   Ryan Tyler 11/21/2020, 2:20 PM

## 2020-11-21 NOTE — Progress Notes (Signed)
Exception made per Director- pt. Confused

## 2020-11-21 NOTE — Progress Notes (Addendum)
PROGRESS NOTE                                                                             PROGRESS NOTE                                                                                                                                                                                                             Patient Demographics:    Ryan Tyler, is a 87 y.o. male, DOB - March 24, 1928, [id number]  Outpatient Primary MD for the patient is Ryan Huddle, MD    LOS - 1  Admit date - 11/20/2020    Chief Complaint  Patient presents with  . Fever       Brief Narrative     HPI: Ryan Tyler is a 87 y.o. male with medical history significant of HTN; HLD; ETOH dependence with h/o alcoholic pancreatitis; dementia; and bladder cancer s/p TURBT presenting with fever.  I was unable to reach family at the time of admission and so history is obtained from the chart only, as the patient is unable to provide history.  Per triage note, EMS brought in the patient due to fever, anorexia x 1 week.  His son was concerned about bedbugs, as well.  Chronically AMS, uncertain how much is new today.  It is not clear whether the patient has continued to drink alcohol.  He was last admitted from [id number] for alcoholic pancreatitis.  Notes indicate that he was confused and a very poor historian.  He was apparently able to localize his abdominal pain but otherwise unable to answer questions well.  He was recommended for SNF but declined and preferred [facility] Tyler with 24 hour assistance at home.    His son did call back.  He [place].  He has noticed that he has been "hoarding his meals."  There is plenty of food in the house but he was not eating properly.  He was staying in bed nonstop.  People  were checking on him.  He was incoherent.  He started shaking so bad this AM that he decided to call 911.  Fever up to 102.  At baseline, he is very hard  of hearing but can do anything he wants.  He still drinks - a couple of drinks a day, a "hot toddy".  Ryan Tyler "are drinkers."  He rents a property, "will not leave this house because my mom died in this house", has had bedbugs since a renter moved into the back of the property.      ED Course: Fever and poor appetite for a week.  Tachypnea, tachycardia, AKI. ?baseline mental status.  Confused.  Unable to reach family.  No current O2 requirement.   Subjective:    Ryan Tyler today himself denies any complaints, no significant events overnight as discussed with staff.     Assessment  & Plan :    Principal Problem:   Encephalopathy due to COVID-19 virus Active Problems:   Hypertension   CKD (chronic kidney disease), stage III (HCC)   AKI (acute kidney injury) ([place])   Dementia with behavioral disturbance (HCC)   DNR (do not resuscitate)   History of bladder cancer  COVID-19 infection  -Patient is poor historian, report he is vaccinated -We will continue with IV remdesivir given risk factors, CRP and D-dimers trending up, will continue with IV Solu-Medrol as well. -He was encouraged to use incentive spirometry and flutter valve. -Continue to trend inflammatory markers    SpO2: 92 %  Recent Labs  Lab 11/20/20 0259 11/20/20 0308 11/20/20 0709 11/20/20 1146 11/21/20 0150  WBC  --  10.1  --   --  7.1  PLT  --  146*  --   --  111*  CRP  --   --   --  5.9* 12.3*  DDIMER  --   --   --  0.92* 1.46*  PROCALCITON  --   --   --  4.68  --   AST  --   --  32  --  23  ALT  --   --  19  --  15  ALKPHOS  --   --  130*  --  92  BILITOT  --   --  1.7*  --  0.9  ALBUMIN  --   --  3.5  --  2.5*  SARSCOV2NAA POSITIVE*  --   --   --   --        ABG     Component Value Date/Time   HCO3 22.1 01/23/2019 2015   TCO2 23 01/23/2019 2015   ACIDBASEDEF 4.0 (H) 01/23/2019 2015   O2SAT 78.0 01/23/2019 2015     AKI on stage 3a CKD -Baseline GFR about 60, currently 38 on  admission -Improving with IV fluids -Hold Zestoretic  Dementia/Encephalopathy -Uncertain baseline  -Continue with supportive care  ETOH dependence -Patient with most recent admission in 12/7900 for alcoholic pancreatitis -It is not clear whether the patient has continued to drink at home -Will place on CIWA protocol for now -Continue folate, thiamine, MVI  HTN -Medications, continue to hold Zestoretic given AKI  H/o bladder cancer -2015 transurethral resection of bladder tumor with gyrus and intravesico chemo bu Dr. Karsten Ro -Pathology with low-grade papillary urothelial carcinoma without apparent spread -It is not apparent whether the patient has had urology f/u since -Would suggest outpatient f/u  Possible bedbugs -Not obviously visualized here, but son reported concern for this  -Nursing staff is  aware so that patient can be decontaminated  DNR -Palliative care note from 01/30/19 reports discussion with the patient's son with clear desire for DNR/DNI.   -Based on previously stated desire for no resuscitation and the patient's age (87yo) and overall condition (acute vs. Chronic dementia, COVID infection), will continue DNR. -His son doesn't want him to suffer.      Condition - Extremely Guarded  Family Communication  : Tried to call son, unable to leave voicemail  Code Status :  dnr  Consults  :  NONE   Disposition Plan  :    Status is: Inpatient  Remains inpatient appropriate because:IV treatments appropriate due to intensity of illness or inability to take PO   Dispo: The patient is from: Home              Anticipated d/c is to: Home              Anticipated d/c date is: 3 days              Patient currently is not medically stable to d/c.   Difficult to place patient No      DVT Prophylaxis  :  Lovenox  Lab Results  Component Value Date   PLT 111 (L) 11/21/2020    Diet :  Diet Order            Diet regular Room service appropriate? Yes;  Fluid consistency: Thin  Diet effective now                  Inpatient Medications  Scheduled Meds: . vitamin C  500 mg Oral Daily  . docusate sodium  100 mg Oral BID  . enoxaparin (LOVENOX) injection  40 mg Subcutaneous Daily  . fluticasone furoate-vilanterol  1 puff Inhalation Daily  . folic acid  1 mg Oral Daily  . methylPREDNISolone (SOLU-MEDROL) injection  40 mg Intravenous BID   Followed by  . [START ON 11/23/2020] predniSONE  50 mg Oral Daily  . multivitamin with minerals  1 tablet Oral Daily  . pantoprazole  40 mg Oral Daily  . sodium chloride flush  3 mL Intravenous Q12H  . sodium chloride flush  3 mL Intravenous Q12H  . thiamine  100 mg Oral Daily  . umeclidinium bromide  1 puff Inhalation Daily  . zinc sulfate  220 mg Oral Daily   Continuous Infusions: . sodium chloride    . remdesivir 100 mg in NS 100 mL 100 mg (11/21/20 1105)   PRN Meds:.sodium chloride, acetaminophen, albuterol, bisacodyl, chlorpheniramine-HYDROcodone, guaiFENesin-dextromethorphan, LORazepam **OR** LORazepam, ondansetron **OR** ondansetron (ZOFRAN) IV, oxyCODONE, polyethylene glycol, sodium chloride flush, sodium phosphate  Antibiotics  :    Anti-infectives (From admission, onward)   Start     Dose/Rate Route Frequency Ordered Stop   11/21/20 1000  remdesivir 100 mg in sodium chloride 0.9 % 100 mL IVPB       "Followed by" Linked Group Details   100 mg 200 mL/hr over 30 Minutes Intravenous Daily 11/20/20 0633 11/25/20 0959   11/20/20 0730  remdesivir 200 mg in sodium chloride 0.9% 250 mL IVPB       "Followed by" Linked Group Details   200 mg 580 mL/hr over 30 Minutes Intravenous Once 11/20/20 [id number] 11/20/20 0900        Emeline Gins Calen Posch M.D on 11/21/2020 at 11:22 AM  To page go to [email or web address]   [facility] -  Office  [phone]      Objective:  Vitals:   11/20/20 2200 11/20/20 2347 11/21/20 0400 11/21/20 0748  BP:  135/90 (!) 105/49 119/64  Pulse: 66 63 (!) 56 (!) 57   Resp:  20 13 15   Temp:  (!) 97.5 F (36.4 C) 98 F (36.7 C) (!) 97.4 F (36.3 C)  TempSrc:  Oral Oral Oral  SpO2:  100% 90% 92%  Weight:        Wt Readings from Last 3 Encounters:  11/20/20 71.8 kg  05/24/20 77.1 kg  01/31/19 78.1 kg     Intake/Output Summary (Last 24 hours) at 11/21/2020 1122 Last data filed at 11/21/2020 0400 Gross per 24 hour  Intake --  Output 2 ml  Net -2 ml     Physical Exam  Awake Alert, pleasant, extremely hard of hearing, easily distracted. Frail Symmetrical Chest wall movement, Good air movement bilaterally, CTAB RRR,No Gallops,Rubs or new Murmurs, No Parasternal Heave +ve B.Sounds, Abd Soft, No tenderness, No rebound - guarding or rigidity. No Cyanosis, Clubbing or edema, No new Rash or bruise      Data Review:    CBC Recent Labs  Lab 11/20/20 0308 11/21/20 0150  WBC 10.1 7.1  HGB 15.2 12.7*  HCT 52.4* 38.9*  PLT 146* 111*  MCV 87.2 81.9  MCH 25.3* 26.7  MCHC 29.0* 32.6  RDW 15.9* 15.9*  LYMPHSABS  --  0.3*  MONOABS  --  0.2  EOSABS  --  0.0  BASOSABS  --  0.0    Recent Labs  Lab 11/20/20 0308 11/20/20 0709 11/20/20 1146 11/21/20 0150  NA 139  --   --  137  K 4.4  --   --  4.2  CL 99  --   --  104  CO2 22  --   --  22  GLUCOSE 186*  --   --  187*  BUN 37*  --   --  43*  CREATININE 1.68*  --   --  1.30*  CALCIUM 8.9  --   --  8.6*  AST  --  32  --  23  ALT  --  19  --  15  ALKPHOS  --  130*  --  92  BILITOT  --  1.7*  --  0.9  ALBUMIN  --  3.5  --  2.5*  MG  --   --   --  1.6*  CRP  --   --  5.9* 12.3*  DDIMER  --   --  0.92* 1.46*  PROCALCITON  --   --  4.68  --     ------------------------------------------------------------------------------------------------------------------ No results for input(s): CHOL, HDL, LDLCALC, TRIG, CHOLHDL, LDLDIRECT in the last 72 hours.  Lab Results  Component Value Date   HGBA1C 5.9 (H) 05/24/2020    ------------------------------------------------------------------------------------------------------------------ No results for input(s): TSH, T4TOTAL, T3FREE, THYROIDAB in the last 72 hours.  Invalid input(s): FREET3  Cardiac Enzymes No results for input(s): CKMB, TROPONINI, MYOGLOBIN in the last 168 hours.  Invalid input(s): CK ------------------------------------------------------------------------------------------------------------------ No results found for: BNP  Micro Results Recent Results (from the past 240 hour(s))  SARS Coronavirus 2 by RT PCR (hospital order, performed in [facility] hospital lab) Nasopharyngeal Nasopharyngeal Swab     Status: Abnormal   Collection Time: 11/20/20  2:59 AM   Specimen: Nasopharyngeal Swab  Result Value Ref Range Status   SARS Coronavirus 2 POSITIVE (A) NEGATIVE Final    Comment: RESULT CALLED TO, READ BACK BY AND VERIFIED WITH: Thurman Coyer [id number] 11/20/2020 T.  TYSOR (NOTE) SARS-CoV-2 target nucleic acids are DETECTED  SARS-CoV-2 RNA is generally detectable in upper respiratory specimens  during the acute phase of infection.  Positive results are indicative  of the presence of the identified virus, but do not rule out bacterial infection or co-infection with other pathogens not detected by the test.  Clinical correlation with patient history and  other diagnostic information is necessary to determine patient infection status.  The expected result is negative.  Fact Sheet for Patients:   StrictlyIdeas.no   Fact Sheet for Healthcare Providers:   BankingDealers.co.za    This test is not yet approved or cleared by the [place] FDA and  has been authorized for detection and/or diagnosis of SARS-CoV-2 by FDA under an Emergency Use Authorization (EUA).  This EUA will remain in effect (meaning thi s test can be used) for the duration of  the COVID-19 declaration under Section  564(b)(1) of the Act, 21 U.S.C. section 360-bbb-3(b)(1), unless the authorization is terminated or revoked sooner.  Performed at [facility], [place] [address], [place], [place] [address]     Radiology Reports DG Chest Portable 1 View  Result Date: 11/20/2020 CLINICAL DATA:  Shortness of breath and fevers EXAM: PORTABLE CHEST 1 VIEW COMPARISON:  05/24/2020 FINDINGS: Cardiac shadow is stable. Aortic calcifications are noted. Left lung is clear. Right lung demonstrates some upper lobe atelectatic changes. No bony abnormality is seen. IMPRESSION: Mild right lung atelectatic changes. Electronically Signed   By: Inez Catalina M.D.   On: 11/20/2020 03:18

## 2020-11-21 NOTE — Evaluation (Signed)
Physical Therapy Evaluation Patient Details Name: Ryan Tyler MRN: [id number] DOB: [birth year] Today's Date: 11/21/2020   History of Present Illness  Ryan Tyler is a 87 y.o. male with medical history significant of HTN; HLD; ETOH dependence with h/o alcoholic pancreatitis; dementia; and bladder cancer s/p TURBT presenting with fever.  I was unable to reach family at the time of admission and so history is obtained from the chart only, as the patient is unable to provide history.  Per triage note, EMS brought in the patient due to fever, anorexia x 1 week.  His son was concerned about bedbugs, as well.  Chronically AMS, uncertain how much is new today.  It is not clear whether the patient has continued to drink alcohol.  Clinical Impression  Patient is extremely HOH. Making assessment difficult. He is mod independent with bed mobility. Transfers with min guard +2. Ambulated 25 feet without AD and min assist +2. Patient appears to move fairly well. He is unable to provide prior history. It is believed that he lives alone. Patient will continue to benefit from skilled PT while here to improve functional independence, strength and safety with mobility.      Follow Up Recommendations Home health PT;Supervision/Assistance - 24 hour    Equipment Recommendations  None recommended by PT    Recommendations for Other Services       Precautions / Restrictions Precautions Precautions: Fall Precaution Comments: COVID Restrictions Weight Bearing Restrictions: No      Mobility  Bed Mobility Overal bed mobility: Modified Independent                  Transfers Overall transfer level: Needs assistance Equipment used: 2 person hand held assist Transfers: Sit to/from Stand Sit to Stand: Min assist;+2 safety/equipment         General transfer comment: patient had difficulty understanding what we wanted him to do with multimodal cues provided. Finally  understood.  Ambulation/Gait Ambulation/Gait assistance: Min assist;+2 safety/equipment Gait Distance (Feet): 25 Feet Assistive device: 2 person hand held assist Gait Pattern/deviations: Step-through pattern;Decreased step length - right;Decreased step length - left Gait velocity: decr   General Gait Details: patient is slightly unsteady, hesitant with gait.  Stairs            Wheelchair Mobility    Modified Rankin (Stroke Patients Only)       Balance Overall balance assessment: Needs assistance Sitting-balance support: Feet supported Sitting balance-Leahy Scale: Good     Standing balance support: Bilateral upper extremity supported;During functional activity Standing balance-Leahy Scale: Fair Standing balance comment: requires B UE support for safety and balance.                             Pertinent Vitals/Pain Pain Assessment: No/denies pain    Home Living Family/patient expects to be discharged to:: Private residence Living Arrangements: Alone Available Help at Discharge: Family;Available PRN/intermittently Type of Home: House Home Access: Stairs to enter Entrance Stairs-Rails: Psychiatric nurse of Steps: 5 Home Layout: One level Home Equipment: Walker - 2 wheels Additional Comments: Pt unable to provide PLOF or home situation and family not present.  Information taken from prior admission. Pt reporting he uses RW at home.    Prior Function Level of Independence: Needs assistance   Gait / Transfers Assistance Needed: Pt utilizes RW for mobility     Comments: Patient unable to provide history     Hand Dominance  Extremity/Trunk Assessment   Upper Extremity Assessment Upper Extremity Assessment: Defer to OT evaluation    Lower Extremity Assessment Lower Extremity Assessment: Generalized weakness    Cervical / Trunk Assessment Cervical / Trunk Assessment: Normal  Communication   Communication: HOH   Cognition Arousal/Alertness: Awake/alert Behavior During Therapy: WFL for tasks assessed/performed Overall Cognitive Status: Difficult to assess                                 General Comments: difficult to assess due to profound hearing loss      General Comments      Exercises     Assessment/Plan    PT Assessment Patient needs continued PT services  PT Problem List Decreased strength;Decreased mobility;Decreased activity tolerance;Decreased balance;Decreased cognition       PT Treatment Interventions DME instruction;Therapeutic activities;Gait training;Therapeutic exercise;Patient/family education;Stair training;Balance training;Functional mobility training    PT Goals (Current goals can be found in the Care Plan section)  Acute Rehab PT Goals Patient Stated Goal: none stated PT Goal Formulation: Patient unable to participate in goal setting Time For Goal Achievement: 12/02/20    Frequency Min 3X/week   Barriers to discharge Decreased caregiver support      Co-evaluation               AM-PAC PT "6 Clicks" Mobility  Outcome Measure Help needed turning from your back to your side while in a flat bed without using bedrails?: None Help needed moving from lying on your back to sitting on the side of a flat bed without using bedrails?: None Help needed moving to and from a bed to a chair (including a wheelchair)?: A Little Help needed standing up from a chair using your arms (e.g., wheelchair or bedside chair)?: A Little Help needed to walk in hospital room?: A Little Help needed climbing 3-5 steps with a railing? : A Little 6 Click Score: 20    End of Session Equipment Utilized During Treatment: Gait belt Activity Tolerance: Patient tolerated treatment well Patient left: in bed;with call bell/phone within reach;with bed alarm set Nurse Communication: Mobility status PT Visit Diagnosis: Unsteadiness on feet (R26.81);Muscle weakness (generalized)  (M62.81);Difficulty in walking, not elsewhere classified (R26.2)    Time: 6270-3500 PT Time Calculation (min) (ACUTE ONLY): 17 min   Charges:   PT Evaluation $PT Eval Moderate Complexity: 1 Mod          Brax Walen, PT, GCS 11/21/20,12:10 PM

## 2020-11-22 DIAGNOSIS — G9349 Other encephalopathy: Secondary | ICD-10-CM | POA: Diagnosis not present

## 2020-11-22 DIAGNOSIS — Z8551 Personal history of malignant neoplasm of bladder: Secondary | ICD-10-CM

## 2020-11-22 DIAGNOSIS — U071 COVID-19: Secondary | ICD-10-CM | POA: Diagnosis not present

## 2020-11-22 LAB — COMPREHENSIVE METABOLIC PANEL
ALT: 18 U/L (ref 0–44)
AST: 29 U/L (ref 15–41)
Albumin: 2.8 g/dL — ABNORMAL LOW (ref 3.5–5.0)
Alkaline Phosphatase: 93 U/L (ref 38–126)
Anion gap: 13 (ref 5–15)
BUN: 39 mg/dL — ABNORMAL HIGH (ref 8–23)
CO2: 24 mmol/L (ref 22–32)
Calcium: 8.4 mg/dL — ABNORMAL LOW (ref 8.9–10.3)
Chloride: 99 mmol/L (ref 98–111)
Creatinine, Ser: 1.23 mg/dL (ref 0.61–1.24)
GFR, Estimated: 55 mL/min — ABNORMAL LOW (ref >60)
Glucose, Bld: 150 mg/dL — ABNORMAL HIGH (ref 70–99)
Potassium: 3.3 mmol/L — ABNORMAL LOW (ref 3.5–5.1)
Sodium: 136 mmol/L (ref 135–145)
Total Bilirubin: 1.2 mg/dL (ref 0.3–1.2)
Total Protein: 5.5 g/dL — ABNORMAL LOW (ref 6.5–8.1)

## 2020-11-22 LAB — CBC WITH DIFFERENTIAL/PLATELET
Abs Immature Granulocytes: 0.03 10*3/uL (ref 0.00–0.07)
Basophils Absolute: 0 10*3/uL (ref 0.0–0.1)
Basophils Relative: 0 %
Eosinophils Absolute: 0 10*3/uL (ref 0.0–0.5)
Eosinophils Relative: 0 %
HCT: 46.4 % (ref 39.0–52.0)
Hemoglobin: 13.8 g/dL (ref 13.0–17.0)
Immature Granulocytes: 0 %
Lymphocytes Relative: 3 %
Lymphs Abs: 0.2 10*3/uL — ABNORMAL LOW (ref 0.7–4.0)
MCH: 25 pg — ABNORMAL LOW (ref 26.0–34.0)
MCHC: 29.7 g/dL — ABNORMAL LOW (ref 30.0–36.0)
MCV: 84.1 fL (ref 80.0–100.0)
Monocytes Absolute: 0.4 10*3/uL (ref 0.1–1.0)
Monocytes Relative: 5 %
Neutro Abs: 6.4 10*3/uL (ref 1.7–7.7)
Neutrophils Relative %: 92 %
Platelets: 128 10*3/uL — ABNORMAL LOW (ref 150–400)
RBC: 5.52 MIL/uL (ref 4.22–5.81)
RDW: 15.6 % — ABNORMAL HIGH (ref 11.5–15.5)
WBC: 7 10*3/uL (ref 4.0–10.5)
nRBC: 0 % (ref 0.0–0.2)

## 2020-11-22 LAB — C-REACTIVE PROTEIN: CRP: 9.3 mg/dL — ABNORMAL HIGH (ref <1.0)

## 2020-11-22 LAB — FERRITIN: Ferritin: 54 ng/mL (ref 24–336)

## 2020-11-22 LAB — D-DIMER, QUANTITATIVE: D-Dimer, Quant: 2 ug/mL-FEU — ABNORMAL HIGH (ref 0.00–0.50)

## 2020-11-22 LAB — PHOSPHORUS: Phosphorus: 2.9 mg/dL (ref 2.5–4.6)

## 2020-11-22 LAB — MAGNESIUM: Magnesium: 1.6 mg/dL — ABNORMAL LOW (ref 1.7–2.4)

## 2020-11-22 MED ORDER — POTASSIUM CHLORIDE CRYS ER 20 MEQ PO TBCR
40.0000 meq | EXTENDED_RELEASE_TABLET | Freq: Four times a day (QID) | ORAL | Status: AC
  Administered 2020-11-22 (×2): 40 meq via ORAL
  Filled 2020-11-22 (×2): qty 2

## 2020-11-22 MED ORDER — MAGNESIUM SULFATE 2 GM/50ML IV SOLN
2.0000 g | Freq: Once | INTRAVENOUS | Status: AC
  Administered 2020-11-22: 2 g via INTRAVENOUS
  Filled 2020-11-22: qty 50

## 2020-11-22 MED ORDER — SPIRITUS FRUMENTI
1.0000 | Freq: Two times a day (BID) | ORAL | Status: DC
  Administered 2020-11-23: 1 via ORAL
  Filled 2020-11-22 (×3): qty 1

## 2020-11-22 MED ORDER — THIAMINE HCL 100 MG/ML IJ SOLN
500.0000 mg | Freq: Three times a day (TID) | INTRAVENOUS | Status: AC
  Administered 2020-11-22 – 2020-11-25 (×9): 500 mg via INTRAVENOUS
  Filled 2020-11-22 (×10): qty 5

## 2020-11-22 MED ORDER — SPIRITUS FRUMENTI
1.0000 | Freq: Once | ORAL | Status: AC
  Administered 2020-11-22: 1 via ORAL
  Filled 2020-11-22: qty 1

## 2020-11-22 MED ORDER — HALOPERIDOL LACTATE 5 MG/ML IJ SOLN
2.0000 mg | Freq: Four times a day (QID) | INTRAMUSCULAR | Status: DC | PRN
  Administered 2020-11-22: 2 mg via INTRAVENOUS
  Filled 2020-11-22: qty 1

## 2020-11-22 NOTE — TOC Initial Note (Signed)
Transition of Care [facility]) - Initial/Assessment Note    Patient Details  Name: Ryan Tyler MRN: [id number] Date of Birth: [birth year]  [facility] CM/SW Contact:    Ryan Tyler, [place] Phone Number: 11/22/2020, 3:29 PM  Clinical Narrative:                  CSW spoke with patient's son,Ryan Tyler. CSW introduced self and explained role. CSW discussed with Ryan Tyler disposition plan for patient . He confirmed the patient lives home alone but states he has many friends that comes by and check on patient. He shared the patient's loss his wife in his current home and the patient has always expressed he never wants to leave there for that reason. Patient' son was informed about 24/7 supervision - he states " I will do whatever I gotta do" . He is considering taking a couple weeks off so he can be there with the patient. He states his preference if for the patient to return home and he strongly believes that is what the patient would want.   Ryan Tyler, MSW, LCSW Clinical Social Worker    Expected Discharge Plan: [place] Barriers to Discharge: Continued Medical Work up   Patient Goals and CMS Choice        Expected Discharge Plan and Services Expected Discharge Plan: [place] In-house Referral: Clinical Social Work     Living arrangements for the past 2 months: [place]                                      Prior Living Arrangements/Services Living arrangements for the past 2 months: Single Family Home Lives with:: Self                   Activities of Daily Living      Permission Sought/Granted                  Emotional Assessment       Orientation: : Oriented to Self Alcohol / Substance Use: Alcohol Use,Not Applicable Psych Involvement: No (comment)  Admission diagnosis:  AKI (acute kidney injury) ([place]) [N17.9] Encephalopathy due to COVID-19 virus [U07.1, G93.49] COVID-19  [U07.1] Patient Active Problem List   Diagnosis Date Noted  . Encephalopathy due to COVID-19 virus 11/20/2020  . Dementia with behavioral disturbance ([place]) 11/20/2020  . DNR (do not resuscitate) 11/20/2020  . History of bladder cancer 11/20/2020  . Bronchitis 05/24/2020  . Hematemesis 05/24/2020  . AKI (acute kidney injury) ([place]) 05/24/2020  . Alcohol dependence syndrome ([place]) 05/24/2020  . Hyperglycemia 05/24/2020  . Metabolic acidemia 16/10/930  . CKD (chronic kidney disease), stage III ([place]) 01/23/2019  . Leukocytosis 01/23/2019  . SIRS (systemic inflammatory response syndrome) ([place]) 01/23/2019  . Acute pancreatitis 01/23/2019  . Lobar pneumonia ([place]) 01/23/2019  . Sepsis ([place]) 01/23/2019  . Hypertension   . Ambulatory dysfunction 03/09/2018  . Renal mass 03/09/2018  . Closed fracture of iliac crest, right, initial encounter ([place]) 03/09/2018  . Fall at home, initial encounter 03/09/2018  . Right hip pain 03/09/2018  . Fall 03/09/2018  . S/P laparoscopic cholecystectomy 08/16/2015   PCP:  Ryan Huddle, MD Pharmacy:   [facility] [place], [place] [address] Phone: [phone] Fax: [phone]  [facility] [phone] -  [place], [place] [address] Phone: [phone] Fax: [phone]     Social Determinants of Health (SDOH) Interventions    Readmission Risk Interventions No flowsheet data found.

## 2020-11-22 NOTE — Progress Notes (Signed)
PROGRESS NOTE                                                                             PROGRESS NOTE                                                                                                                                                                                                             Patient Demographics:    Ryan Tyler, is a 87 y.o. male, DOB - Sep 24, 1928, [id number]  Outpatient Primary MD for the patient is Ryan Huddle, MD    LOS - 2  Admit date - 11/20/2020    Chief Complaint  Patient presents with  . Fever       Brief Narrative     87 y.o. male with medical history significant of HTN; HLD; ETOH dependence with h/o alcoholic pancreatitis; dementia; and bladder cancer s/p TURBT presenting with fever, poor oral intake, with a history of continued alcohol abuse,  including admission due to alcoholic pancreatitis last August. His work-up was significant for AKI with a creatinine of 1.6, COVID-19 infection, as well some report bedbug problem at home since renter moved in recently, hospital stay was complicated by hospital delirium/alcohol withdrawals.    Subjective:    Ryan Tyler today himself denies any complaints, but patient is more confused, restless and agitated.   Assessment  & Plan :    Principal Problem:   Encephalopathy due to COVID-19 virus Active Problems:   Hypertension   CKD (chronic kidney disease), stage III (HCC)   AKI (acute kidney injury) ([place])   Dementia with behavioral disturbance (HCC)   DNR (do not resuscitate)   History of bladder cancer  COVID-19 infection  -Patient is poor historian, report he is vaccinated -continue with IV remdesivir given risk factors. -Continue to trend inflammatory markers including CRP and D-dimers. -Giving inflammatory markers trending up continue with IV steroids. -He was encouraged to use incentive spirometry and flutter valve.    SpO2: 93 %  Recent  Labs  Lab 11/20/20 0259  11/20/20 0308 11/20/20 0709 11/20/20 1146 11/21/20 0150 11/22/20 0221  WBC  --  10.1  --   --  7.1 7.0  PLT  --  146*  --   --  111* 128*  CRP  --   --   --  5.9* 12.3* 9.3*  DDIMER  --   --   --  0.92* 1.46* 2.00*  PROCALCITON  --   --   --  4.68  --   --   AST  --   --  32  --  23 29  ALT  --   --  19  --  15 18  ALKPHOS  --   --  130*  --  92 93  BILITOT  --   --  1.7*  --  0.9 1.2  ALBUMIN  --   --  3.5  --  2.5* 2.8*  SARSCOV2NAA POSITIVE*  --   --   --   --   --        ABG     Component Value Date/Time   HCO3 22.1 01/23/2019 2015   TCO2 23 01/23/2019 2015   ACIDBASEDEF 4.0 (H) 01/23/2019 2015   O2SAT 78.0 01/23/2019 2015     AKI on stage 3a CKD -Baseline GFR about 60, currently 38 on admission -Improving with IV fluids, creatinine is 1.23 this morning -Hold Zestoretic  Acute metabolic encephalopathy -Patient is restless, confused, this is most likely in setting of hospital delirium versus alcohol withdrawals with DTs, please see discussion below under alcohol dependence.  ETOH dependence -Patient with most recent admission in 05/2020 for alcoholic pancreatitis -Started on IV thiamine 500 mg 3 times daily x3 days -Continue folate, thiamine, MVI -Patient with bad reaction to Ativan, actually getting more stressed and confused confused, so we will get him beer twice daily to prevent withdrawals, and will keep on as needed Haldol for agitation.  HTN -Medications, continue to hold Zestoretic given AKI  H/o bladder cancer -2015 transurethral resection of bladder tumor with gyrus and intravesico chemo bu Dr. Vernie Tyler -Pathology with low-grade papillary urothelial carcinoma without apparent spread -It is not apparent whether the patient has had urology f/u since -Would suggest outpatient f/u  Possible bedbugs -Nursing staff is aware so that patient can be decontaminated  DNR -Palliative care note from 01/30/19 reports discussion  with the patient's son with clear desire for DNR/DNI.   -Based on previously stated desire for no resuscitation and the patient's age (87yo) and overall condition (acute vs. Chronic dementia, COVID infection), will continue DNR. -His son doesn't want him to suffer.      Condition - Extremely Guarded  Family Communication  : discussed with son by phone  Code Status :  dnr  Consults  :  NONE   Disposition Plan  :    Status is: Inpatient  Remains inpatient appropriate because:IV treatments appropriate due to intensity of illness or inability to take PO   Dispo: The patient is from: Home              Anticipated d/c is to: Home              Anticipated d/c date is: 3 days              Patient currently is not medically stable to d/c.   Difficult to place patient No      DVT Prophylaxis  :  Lovenox  Lab Results  Component Value Date  PLT 128 (L) 11/22/2020    Diet :  Diet Order            Diet regular Room service appropriate? Yes; Fluid consistency: Thin  Diet effective now                  Inpatient Medications  Scheduled Meds: . vitamin C  500 mg Oral Daily  . docusate sodium  100 mg Oral BID  . enoxaparin (LOVENOX) injection  40 mg Subcutaneous Daily  . fluticasone furoate-vilanterol  1 puff Inhalation Daily  . folic acid  1 mg Oral Daily  . methylPREDNISolone (SOLU-MEDROL) injection  40 mg Intravenous BID  . multivitamin with minerals  1 tablet Oral Daily  . pantoprazole  40 mg Oral Daily  . potassium chloride  40 mEq Oral Q6H  . sodium chloride flush  3 mL Intravenous Q12H  . sodium chloride flush  3 mL Intravenous Q12H  . umeclidinium bromide  1 puff Inhalation Daily  . zinc sulfate  220 mg Oral Daily   Continuous Infusions: . sodium chloride    . remdesivir 100 mg in NS 100 mL 100 mg (11/22/20 1047)  . thiamine injection     PRN Meds:.sodium chloride, acetaminophen, albuterol, bisacodyl, chlorpheniramine-HYDROcodone,  guaiFENesin-dextromethorphan, LORazepam **OR** LORazepam, ondansetron **OR** ondansetron (ZOFRAN) IV, oxyCODONE, polyethylene glycol, sodium chloride flush, sodium phosphate  Antibiotics  :    Anti-infectives (From admission, onward)   Start     Dose/Rate Route Frequency Ordered Stop   11/21/20 1000  remdesivir 100 mg in sodium chloride 0.9 % 100 mL IVPB       "Followed by" Linked Group Details   100 mg 200 mL/hr over 30 Minutes Intravenous Daily 11/20/20 0633 11/25/20 0959   11/20/20 0730  remdesivir 200 mg in sodium chloride 0.9% 250 mL IVPB       "Followed by" Linked Group Details   200 mg 580 mL/hr over 30 Minutes Intravenous Once 11/20/20 2585 11/20/20 0900        Emeline Gins Raif Chachere M.D on 11/22/2020 at 2:24 PM  To page go to [email or web address]   [facility] -  Office  [phone]      Objective:   Vitals:   11/21/20 1839 11/21/20 2047 11/22/20 0416 11/22/20 1010  BP: 130/76 127/74 (!) 157/76 124/78  Pulse: (!) 54 (!) 50 (!) 51   Resp: 20 20 20 20   Temp: 98 F (36.7 C)  (!) 96.4 F (35.8 C) (!) 97.5 F (36.4 C)  TempSrc: Oral  Axillary Oral  SpO2: 95% 99% 93%   Weight:        Wt Readings from Last 3 Encounters:  11/20/20 71.8 kg  05/24/20 77.1 kg  01/31/19 78.1 kg     Intake/Output Summary (Last 24 hours) at 11/22/2020 1424 Last data filed at 11/22/2020 0930 Gross per 24 hour  Intake 600 ml  Output 200 ml  Net 400 ml     Physical Exam  Awake, confused, restless, extremely hard of hearing  Symmetrical Chest wall movement, Good air movement bilaterally, CTAB RRR,No Gallops,Rubs or new Murmurs, No Parasternal Heave +ve B.Sounds, Abd Soft, No tenderness, No rebound - guarding or rigidity. No Cyanosis, Clubbing or edema, No new Rash or bruise       Data Review:    CBC Recent Labs  Lab 11/20/20 0308 11/21/20 0150 11/22/20 0221  WBC 10.1 7.1 7.0  HGB 15.2 12.7* 13.8  HCT 52.4* 38.9* 46.4  PLT 146* 111* 128*  MCV 87.2 81.9  84.1  MCH 25.3*  26.7 25.0*  MCHC 29.0* 32.6 29.7*  RDW 15.9* 15.9* 15.6*  LYMPHSABS  --  0.3* 0.2*  MONOABS  --  0.2 0.4  EOSABS  --  0.0 0.0  BASOSABS  --  0.0 0.0    Recent Labs  Lab 11/20/20 0308 11/20/20 0709 11/20/20 1146 11/21/20 0150 11/22/20 0221  NA 139  --   --  137 136  K 4.4  --   --  4.2 3.3*  CL 99  --   --  104 99  CO2 22  --   --  22 24  GLUCOSE 186*  --   --  187* 150*  BUN 37*  --   --  43* 39*  CREATININE 1.68*  --   --  1.30* 1.23  CALCIUM 8.9  --   --  8.6* 8.4*  AST  --  32  --  23 29  ALT  --  19  --  15 18  ALKPHOS  --  130*  --  92 93  BILITOT  --  1.7*  --  0.9 1.2  ALBUMIN  --  3.5  --  2.5* 2.8*  MG  --   --   --  1.6* 1.6*  CRP  --   --  5.9* 12.3* 9.3*  DDIMER  --   --  0.92* 1.46* 2.00*  PROCALCITON  --   --  4.68  --   --     ------------------------------------------------------------------------------------------------------------------ No results for input(s): CHOL, HDL, LDLCALC, TRIG, CHOLHDL, LDLDIRECT in the last 72 hours.  Lab Results  Component Value Date   HGBA1C 5.9 (H) 05/24/2020   ------------------------------------------------------------------------------------------------------------------ No results for input(s): TSH, T4TOTAL, T3FREE, THYROIDAB in the last 72 hours.  Invalid input(s): FREET3  Cardiac Enzymes No results for input(s): CKMB, TROPONINI, MYOGLOBIN in the last 168 hours.  Invalid input(s): CK ------------------------------------------------------------------------------------------------------------------ No results found for: BNP  Micro Results Recent Results (from the past 240 hour(s))  SARS Coronavirus 2 by RT PCR (hospital order, performed in [facility] hospital lab) Nasopharyngeal Nasopharyngeal Swab     Status: Abnormal   Collection Time: 11/20/20  2:59 AM   Specimen: Nasopharyngeal Swab  Result Value Ref Range Status   SARS Coronavirus 2 POSITIVE (A) NEGATIVE Final    Comment: RESULT CALLED TO, READ BACK  BY AND VERIFIED WITH: Thurman Coyer [id number] 11/20/2020 T. TYSOR (NOTE) SARS-CoV-2 target nucleic acids are DETECTED  SARS-CoV-2 RNA is generally detectable in upper respiratory specimens  during the acute phase of infection.  Positive results are indicative  of the presence of the identified virus, but do not rule out bacterial infection or co-infection with other pathogens not detected by the test.  Clinical correlation with patient history and  other diagnostic information is necessary to determine patient infection status.  The expected result is negative.  Fact Sheet for Patients:   StrictlyIdeas.no   Fact Sheet for Healthcare Providers:   BankingDealers.co.za    This test is not yet approved or cleared by the [place] FDA and  has been authorized for detection and/or diagnosis of SARS-CoV-2 by FDA under an Emergency Use Authorization (EUA).  This EUA will remain in effect (meaning thi s test can be used) for the duration of  the COVID-19 declaration under Section 564(b)(1) of the Act, 21 U.S.C. section 360-bbb-3(b)(1), unless the authorization is terminated or revoked sooner.  Performed at [facility], [place] [address] [place], [place] [address]  Radiology Reports DG Chest Portable 1 View  Result Date: 11/20/2020 CLINICAL DATA:  Shortness of breath and fevers EXAM: PORTABLE CHEST 1 VIEW COMPARISON:  05/24/2020 FINDINGS: Cardiac shadow is stable. Aortic calcifications are noted. Left lung is clear. Right lung demonstrates some upper lobe atelectatic changes. No bony abnormality is seen. IMPRESSION: Mild right lung atelectatic changes. Electronically Signed   By: Inez Catalina M.D.   On: 11/20/2020 03:18

## 2020-11-22 NOTE — Progress Notes (Signed)
   11/22/20 1032  What Happened  Was fall witnessed? No  Was patient injured? No  Patient found on floor  Found by Staff-comment (Ayana NT)  Stated prior activity other (comment) (pt was laying in bed, tried to get out of bed)  Follow Up  MD notified Dr. Waldron Labs  Time MD notified 1009  Family notified Yes - comment (son)  Time family notified 1030  Additional tests No  Simple treatment Dressing  Adult Fall Risk Assessment  Risk Factor Category (scoring not indicated) High fall risk per protocol (document High fall risk);Fall has occurred during this admission (document High fall risk)  Patient Fall Risk Level High fall risk  Adult Fall Risk Interventions  Required Bundle Interventions *See Row Information* High fall risk - low, moderate, and high requirements implemented  Additional Interventions Use of appropriate toileting equipment (bedpan, BSC, etc.)  Screening for Fall Injury Risk (To be completed on HIGH fall risk patients) - Assessing Need for Low Bed  Risk For Fall Injury- Low Bed Criteria 85 years or older;Previous fall this admission  Will Implement Low Bed and Floor Mats Yes   Rn responded to bed alarm, pt found down on ground on back with IV poll laying across bed. VS stable BP 124/78   Pulse (!) 51   Temp (!) 97.5 F (36.4 C) (Oral)   Resp 20   Wt 71.8 kg   SpO2 93%   BMI 21.47 kg/m  , skin tear noted on left hand and scrap across back. Prior to fall bed alarm was on, yellow socks on, high fall risk arm band in place and fall mats on floor. Dr. Landis Gandy notified of fall. Son mad aware of fall.

## 2020-11-23 DIAGNOSIS — U071 COVID-19: Secondary | ICD-10-CM | POA: Diagnosis not present

## 2020-11-23 DIAGNOSIS — F0281 Dementia in other diseases classified elsewhere with behavioral disturbance: Secondary | ICD-10-CM

## 2020-11-23 DIAGNOSIS — N179 Acute kidney failure, unspecified: Secondary | ICD-10-CM | POA: Diagnosis not present

## 2020-11-23 DIAGNOSIS — G9349 Other encephalopathy: Secondary | ICD-10-CM | POA: Diagnosis not present

## 2020-11-23 DIAGNOSIS — G309 Alzheimer's disease, unspecified: Secondary | ICD-10-CM | POA: Diagnosis not present

## 2020-11-23 LAB — CBC WITH DIFFERENTIAL/PLATELET
Abs Immature Granulocytes: 0.07 10*3/uL (ref 0.00–0.07)
Basophils Absolute: 0 10*3/uL (ref 0.0–0.1)
Basophils Relative: 0 %
Eosinophils Absolute: 0 10*3/uL (ref 0.0–0.5)
Eosinophils Relative: 0 %
HCT: 46.8 % (ref 39.0–52.0)
Hemoglobin: 14.7 g/dL (ref 13.0–17.0)
Immature Granulocytes: 1 %
Lymphocytes Relative: 2 %
Lymphs Abs: 0.2 10*3/uL — ABNORMAL LOW (ref 0.7–4.0)
MCH: 25.8 pg — ABNORMAL LOW (ref 26.0–34.0)
MCHC: 31.4 g/dL (ref 30.0–36.0)
MCV: 82.1 fL (ref 80.0–100.0)
Monocytes Absolute: 0.9 10*3/uL (ref 0.1–1.0)
Monocytes Relative: 7 %
Neutro Abs: 11.3 10*3/uL — ABNORMAL HIGH (ref 1.7–7.7)
Neutrophils Relative %: 90 %
Platelets: 144 10*3/uL — ABNORMAL LOW (ref 150–400)
RBC: 5.7 MIL/uL (ref 4.22–5.81)
RDW: 15.9 % — ABNORMAL HIGH (ref 11.5–15.5)
WBC: 12.5 10*3/uL — ABNORMAL HIGH (ref 4.0–10.5)
nRBC: 0 % (ref 0.0–0.2)

## 2020-11-23 LAB — D-DIMER, QUANTITATIVE: D-Dimer, Quant: 3.51 ug/mL-FEU — ABNORMAL HIGH (ref 0.00–0.50)

## 2020-11-23 LAB — MAGNESIUM: Magnesium: 1.9 mg/dL (ref 1.7–2.4)

## 2020-11-23 LAB — COMPREHENSIVE METABOLIC PANEL
ALT: 19 U/L (ref 0–44)
AST: 38 U/L (ref 15–41)
Albumin: 3 g/dL — ABNORMAL LOW (ref 3.5–5.0)
Alkaline Phosphatase: 89 U/L (ref 38–126)
Anion gap: 12 (ref 5–15)
BUN: 31 mg/dL — ABNORMAL HIGH (ref 8–23)
CO2: 23 mmol/L (ref 22–32)
Calcium: 9 mg/dL (ref 8.9–10.3)
Chloride: 101 mmol/L (ref 98–111)
Creatinine, Ser: 1.07 mg/dL (ref 0.61–1.24)
GFR, Estimated: >60 mL/min (ref >60)
Glucose, Bld: 244 mg/dL — ABNORMAL HIGH (ref 70–99)
Potassium: 4.9 mmol/L (ref 3.5–5.1)
Sodium: 136 mmol/L (ref 135–145)
Total Bilirubin: 1.1 mg/dL (ref 0.3–1.2)
Total Protein: 5.6 g/dL — ABNORMAL LOW (ref 6.5–8.1)

## 2020-11-23 LAB — FERRITIN: Ferritin: 47 ng/mL (ref 24–336)

## 2020-11-23 LAB — C-REACTIVE PROTEIN: CRP: 4.1 mg/dL — ABNORMAL HIGH (ref <1.0)

## 2020-11-23 LAB — PHOSPHORUS: Phosphorus: 2.8 mg/dL (ref 2.5–4.6)

## 2020-11-23 MED ORDER — CHLORDIAZEPOXIDE HCL 5 MG PO CAPS
10.0000 mg | ORAL_CAPSULE | Freq: Three times a day (TID) | ORAL | Status: DC
  Administered 2020-11-23 (×2): 10 mg via ORAL
  Filled 2020-11-23 (×3): qty 2

## 2020-11-23 MED ORDER — BENZONATATE 100 MG PO CAPS
200.0000 mg | ORAL_CAPSULE | Freq: Three times a day (TID) | ORAL | Status: DC
  Administered 2020-11-23 – 2020-11-29 (×11): 200 mg via ORAL
  Filled 2020-11-23 (×12): qty 2

## 2020-11-23 MED ORDER — AMPICILLIN-SULBACTAM SODIUM 1.5 (1-0.5) G IJ SOLR
1.5000 g | Freq: Three times a day (TID) | INTRAMUSCULAR | Status: AC
  Administered 2020-11-23 – 2020-11-28 (×16): 1.5 g via INTRAVENOUS
  Filled 2020-11-23: qty 1.5
  Filled 2020-11-23 (×7): qty 4
  Filled 2020-11-23: qty 1.5
  Filled 2020-11-23 (×7): qty 4

## 2020-11-23 MED ORDER — GUAIFENESIN ER 600 MG PO TB12
600.0000 mg | ORAL_TABLET | Freq: Two times a day (BID) | ORAL | Status: DC
  Administered 2020-11-23 – 2020-11-29 (×10): 600 mg via ORAL
  Filled 2020-11-23 (×11): qty 1

## 2020-11-23 NOTE — Progress Notes (Signed)
PROGRESS NOTE                                                                                                                                                                                                             Patient Demographics:    Ryan Tyler, is a 87 y.o. male, DOB - 26-Sep-1928, [id number]  Outpatient Primary MD for the patient is Josetta Huddle, MD   Admit date - 11/20/2020   LOS - 3  Chief Complaint  Patient presents with  . Fever       Brief Narrative: Patient is a 87 y.o. male with PMHx of HTN, HLD, EtOH use, alcoholic pancreatitis, dementia, bladder cancer s/p TURBT-presenting with fever, poor oral intake-he was found to have AKI, COVID-19 infection-and subsequently admitted to the hospitalist service.  Further hospital stay was complicated by delirium/alcohol withdrawal.  COVID-19 vaccinated status: Vaccinated  Significant Events: 1/30>> Admit to [facility] for AKI/COVID-19 infection  Significant studies: 1/30>>Chest x-ray: Mild right lung atelectasis  COVID-19 medications: Steroids: 1/30>> Remdesivir:1/30>>  Antibiotics: Unasyn: 2/2>>  Microbiology data: None  Procedures: None  Consults: None  DVT prophylaxis: enoxaparin (LOVENOX) injection 40 mg Start: 11/20/20 1600    Subjective:    Ryan Tyler today remains confused-and lethargic.  Opens eyes at times-follows commands only intermittently.  Per nursing staff-attempting to get out of bed.  Coughing-sounds wet-and at times accumulating secretions.   Assessment  & Plan :   Acute metabolic encephalopathy: Multifactorial etiology- hospital delirium, alcohol withdrawal, COVID-19 likely culprits superimposed on dementia.  Nonfocal exam-plans are for supportive care.  AKI: Hemodynamically mediated-resolved with supportive care.  EtOH dependence/withdrawl: Confused-drinks at least 2 bottles of beer and 2 drinks of liquor on a daily  basis-patient's son does not want to try Ativan because it makes him agitated-we will try Librium and see how he does.  Watch closely.  Pneumonia due to COVID-19 and aspiration/bacterial: On room air-however seems to be accumulating secretions-very congested and has transmitted upper airway sounds-suspect that he may be aspirating.  Start Unasyn-Place SLP evaluation.  In the meantime-continue steroids and Remdesivir.  Repeat chest x-ray in a.m.  Fever: afebrile O2 requirements:  SpO2: 92 %   COVID-19 Labs: Recent Labs    11/21/20 0150 11/22/20 0221 11/23/20 0303  DDIMER 1.46* 2.00* 3.51*  FERRITIN 46 54 47  CRP 12.3* 9.3* 4.1*  No results found for: BNP  Recent Labs  Lab 11/20/20 1146  PROCALCITON 4.68    Lab Results  Component Value Date   SARSCOV2NAA POSITIVE (A) 11/20/2020   [place] NEGATIVE 05/24/2020   SARSCOV2NAA NOT DETECTED 01/23/2019    HTN: BP stable-lisinopril/HCTZ on hold.  GERD: Continue PPI  History of suspected COPD: Continue bronchodilators  History of bladder cancer  Hard of hearing  Severe deconditioning/debility-May require SNF-await further input from PT/OT.  Palliative care: DNR in [place] frail/debilitated-with multifactorial etiology-suspected aspiration-plans are to continue with medical care as outlined above-unfortunately if he continues to aspirate-then we might need to talk about further delineation of goals of care with family.  GI prophylaxis: PPI  ABG:    Component Value Date/Time   HCO3 22.1 01/23/2019 2015   TCO2 23 01/23/2019 2015   ACIDBASEDEF 4.0 (H) 01/23/2019 2015   O2SAT 78.0 01/23/2019 2015    Vent Settings: N/A  Condition -Guarded  Family Communication  :  Son-[place]-[phone] updated over the phone 2/2  Code Status : DNR  Diet :  Diet Order            Diet regular Room service appropriate? Yes; Fluid consistency: Thin  Diet effective now                  Disposition Plan  :   Status is:  Inpatient  Remains inpatient appropriate because:Inpatient level of care appropriate due to severity of illness   Dispo: The patient is from: Home              Anticipated d/c is to: Home              Anticipated d/c date is: > 3 days              Patient currently is not medically stable to d/c.   Difficult to place patient No   Barriers to discharge: Ongoing encephalopathy-not yet at baseline.  Antimicorbials  :    Anti-infectives (From admission, onward)   Start     Dose/Rate Route Frequency Ordered Stop   11/21/20 1000  remdesivir 100 mg in sodium chloride 0.9 % 100 mL IVPB       "Followed by" Linked Group Details   100 mg 200 mL/hr over 30 Minutes Intravenous Daily 11/20/20 0633 11/25/20 0959   11/20/20 0730  remdesivir 200 mg in sodium chloride 0.9% 250 mL IVPB       "Followed by" Linked Group Details   200 mg 580 mL/hr over 30 Minutes Intravenous Once 11/20/20 9528 11/20/20 0900      Inpatient Medications  Scheduled Meds: . vitamin C  500 mg Oral Daily  . docusate sodium  100 mg Oral BID  . enoxaparin (LOVENOX) injection  40 mg Subcutaneous Daily  . fluticasone furoate-vilanterol  1 puff Inhalation Daily  . folic acid  1 mg Oral Daily  . methylPREDNISolone (SOLU-MEDROL) injection  40 mg Intravenous BID  . multivitamin with minerals  1 tablet Oral Daily  . pantoprazole  40 mg Oral Daily  . sodium chloride flush  3 mL Intravenous Q12H  . sodium chloride flush  3 mL Intravenous Q12H  . spiritus frumenti  1 each Oral BID  . umeclidinium bromide  1 puff Inhalation Daily  . zinc sulfate  220 mg Oral Daily   Continuous Infusions: . sodium chloride    . remdesivir 100 mg in NS 100 mL Stopped (11/23/20 1037)  . thiamine injection Stopped (11/23/20 1000)  PRN Meds:.sodium chloride, acetaminophen, albuterol, bisacodyl, chlorpheniramine-HYDROcodone, guaiFENesin-dextromethorphan, haloperidol lactate, ondansetron **OR** ondansetron (ZOFRAN) IV, oxyCODONE, polyethylene  glycol, sodium chloride flush, sodium phosphate   Time Spent in minutes  25  See all Orders from today for further details   Oren Binet M.D on 11/23/2020 at 2:21 PM  To page go to [email or web address] - use universal password  [facility] -  Office  [phone]    Objective:   Vitals:   11/22/20 2352 11/23/20 0345 11/23/20 0700 11/23/20 1100  BP: (!) 146/96 133/64 120/85 (!) 124/109  Pulse: 70 67 62 67  Resp: 20 20 20 20   Temp: 97.6 F (36.4 C) 97.8 F (36.6 C) 98 F (36.7 C) 97.8 F (36.6 C)  TempSrc: Axillary Axillary Axillary Axillary  SpO2: 94% 91% 94% 92%  Weight:        Wt Readings from Last 3 Encounters:  11/20/20 71.8 kg  05/24/20 77.1 kg  01/31/19 78.1 kg     Intake/Output Summary (Last 24 hours) at 11/23/2020 1421 Last data filed at 11/23/2020 1324 Gross per 24 hour  Intake 618.46 ml  Output --  Net 618.46 ml     Physical Exam Gen Exam: Confused-but not in any distress HEENT:atraumatic, normocephalic Chest: B/L clear to auscultation anteriorly CVS:S1S2 regular Abdomen:soft non tender, non distended Extremities:no edema Neurology: Moving all 4 extremities.   Skin: no rash   Data Review:    CBC Recent Labs  Lab 11/20/20 0308 11/21/20 0150 11/22/20 0221 11/23/20 0303  WBC 10.1 7.1 7.0 12.5*  HGB 15.2 12.7* 13.8 14.7  HCT 52.4* 38.9* 46.4 46.8  PLT 146* 111* 128* 144*  MCV 87.2 81.9 84.1 82.1  MCH 25.3* 26.7 25.0* 25.8*  MCHC 29.0* 32.6 29.7* 31.4  RDW 15.9* 15.9* 15.6* 15.9*  LYMPHSABS  --  0.3* 0.2* 0.2*  MONOABS  --  0.2 0.4 0.9  EOSABS  --  0.0 0.0 0.0  BASOSABS  --  0.0 0.0 0.0    Chemistries  Recent Labs  Lab 11/20/20 0308 11/20/20 0709 11/21/20 0150 11/22/20 0221 11/23/20 0303  NA 139  --  137 136 136  K 4.4  --  4.2 3.3* 4.9  CL 99  --  104 99 101  CO2 22  --  22 24 23   GLUCOSE 186*  --  187* 150* 244*  BUN 37*  --  43* 39* 31*  CREATININE 1.68*  --  1.30* 1.23 1.07  CALCIUM 8.9  --  8.6* 8.4* 9.0  MG  --    --  1.6* 1.6* 1.9  AST  --  32 23 29 38  ALT  --  19 15 18 19   ALKPHOS  --  130* 92 93 89  BILITOT  --  1.7* 0.9 1.2 1.1   ------------------------------------------------------------------------------------------------------------------ No results for input(s): CHOL, HDL, LDLCALC, TRIG, CHOLHDL, LDLDIRECT in the last 72 hours.  Lab Results  Component Value Date   HGBA1C 5.9 (H) 05/24/2020   ------------------------------------------------------------------------------------------------------------------ No results for input(s): TSH, T4TOTAL, T3FREE, THYROIDAB in the last 72 hours.  Invalid input(s): FREET3 ------------------------------------------------------------------------------------------------------------------ Recent Labs    11/22/20 0221 11/23/20 0303  FERRITIN 54 47    Coagulation profile No results for input(s): INR, PROTIME in the last 168 hours.  Recent Labs    11/22/20 0221 11/23/20 0303  DDIMER 2.00* 3.51*    Cardiac Enzymes No results for input(s): CKMB, TROPONINI, MYOGLOBIN in the last 168 hours.  Invalid input(s): CK ------------------------------------------------------------------------------------------------------------------ No results found for: BNP  Micro Results  Recent Results (from the past 240 hour(s))  SARS Coronavirus 2 by RT PCR (hospital order, performed in [facility] hospital lab) Nasopharyngeal Nasopharyngeal Swab     Status: Abnormal   Collection Time: 11/20/20  2:59 AM   Specimen: Nasopharyngeal Swab  Result Value Ref Range Status   SARS Coronavirus 2 POSITIVE (A) NEGATIVE Final    Comment: RESULT CALLED TO, READ BACK BY AND VERIFIED WITH: Thurman Coyer [id number] 11/20/2020 T. TYSOR (NOTE) SARS-CoV-2 target nucleic acids are DETECTED  SARS-CoV-2 RNA is generally detectable in upper respiratory specimens  during the acute phase of infection.  Positive results are indicative  of the presence of the identified virus, but do not  rule out bacterial infection or co-infection with other pathogens not detected by the test.  Clinical correlation with patient history and  other diagnostic information is necessary to determine patient infection status.  The expected result is negative.  Fact Sheet for Patients:   StrictlyIdeas.no   Fact Sheet for Healthcare Providers:   BankingDealers.co.za    This test is not yet approved or cleared by the [place] FDA and  has been authorized for detection and/or diagnosis of SARS-CoV-2 by FDA under an Emergency Use Authorization (EUA).  This EUA will remain in effect (meaning thi s test can be used) for the duration of  the COVID-19 declaration under Section 564(b)(1) of the Act, 21 U.S.C. section 360-bbb-3(b)(1), unless the authorization is terminated or revoked sooner.  Performed at [facility], [place] [address], [place], [place] [address]     Radiology Reports DG Chest Portable 1 View  Result Date: 11/20/2020 CLINICAL DATA:  Shortness of breath and fevers EXAM: PORTABLE CHEST 1 VIEW COMPARISON:  05/24/2020 FINDINGS: Cardiac shadow is stable. Aortic calcifications are noted. Left lung is clear. Right lung demonstrates some upper lobe atelectatic changes. No bony abnormality is seen. IMPRESSION: Mild right lung atelectatic changes. Electronically Signed   By: Inez Catalina M.D.   On: 11/20/2020 03:18

## 2020-11-23 NOTE — Progress Notes (Signed)
Physical Therapy Treatment Patient Details Name: Ryan Tyler MRN: [id number] DOB: 1928-03-29 Today's Date: 11/23/2020    History of Present Illness Ryan Tyler is a 87 y.o. male with medical history significant of HTN; HLD; ETOH dependence with h/o alcoholic pancreatitis; dementia; and bladder cancer s/p TURBT presenting with fever. Per triage note, EMS brought in the patient due to fever, anorexia x 1 week.  His son was concerned about bedbugs. It is not clear whether the patient has continued to drink alcohol.  Pt admitted with encephalopathy and COVID 19    PT Comments    Pt continues to have confusion and requiring increased assistance today.  He required mod A for transfers and gait to maintain balance.  Some limitations due to confusion and HOH.  Due to declined mobility and living alone - updated recommendation to SNF as pt is very high fall risk and cannot care for himself at this time.  He is on RA with VSS.     Follow Up Recommendations  SNF     Equipment Recommendations  Rolling walker with 5" wheels    Recommendations for Other Services       Precautions / Restrictions Precautions Precautions: Fall;Other (comment)    Mobility  Bed Mobility Overal bed mobility: Needs Assistance Bed Mobility: Supine to Sit;Sit to Supine     Supine to sit: Min assist Sit to supine: Min assist   General bed mobility comments: increased time and multimodal cues  Transfers Overall transfer level: Needs assistance Equipment used: Rolling walker (2 wheeled) Transfers: Sit to/from Stand Sit to Stand: Mod assist         General transfer comment: Mod A to rise with multimodal cues and bed elevated  Ambulation/Gait Ambulation/Gait assistance: Mod assist Gait Distance (Feet): 25 Feet Assistive device: Rolling walker (2 wheeled) Gait Pattern/deviations: Step-to pattern;Decreased stride length;Trunk flexed     General Gait Details: Mod A for stability, RW control, and  directions   Stairs             Wheelchair Mobility    Modified Rankin (Stroke Patients Only)       Balance Overall balance assessment: Needs assistance Sitting-balance support: No upper extremity supported Sitting balance-Leahy Scale: Fair     Standing balance support: Bilateral upper extremity supported;During functional activity Standing balance-Leahy Scale: Poor Standing balance comment: Requiring RW and min-mod A                            Cognition Arousal/Alertness: Awake/alert Behavior During Therapy: Restless Overall Cognitive Status: No family/caregiver present to determine baseline cognitive functioning                                 General Comments: Pt does have hx of dementia and AMS - no family present to determine baseline.  Pt turned head to his name but not able to answer any questions.  Followed simple commands inconsistently with multimodal cues and repetition.  Once up just kept saying " I  want to lay down"      Exercises      General Comments        Pertinent Vitals/Pain Pain Assessment: No/denies pain Pain Score: 0-No pain    Home Living                      Prior Function  PT Goals (current goals can now be found in the care plan section) Acute Rehab PT Goals Patient Stated Goal: none stated PT Goal Formulation: Patient unable to participate in goal setting Time For Goal Achievement: 12/02/20 Progress towards PT goals: Not progressing toward goals - comment (limited by confusion)    Frequency    Min 2X/week      PT Plan Discharge plan needs to be updated;Frequency needs to be updated    Co-evaluation              AM-PAC PT "6 Clicks" Mobility   Outcome Measure  Help needed turning from your back to your side while in a flat bed without using bedrails?: A Little Help needed moving from lying on your back to sitting on the side of a flat bed without using bedrails?:  A Lot Help needed moving to and from a bed to a chair (including a wheelchair)?: A Lot Help needed standing up from a chair using your arms (e.g., wheelchair or bedside chair)?: A Lot Help needed to walk in hospital room?: A Lot Help needed climbing 3-5 steps with a railing? : A Lot 6 Click Score: 13    End of Session Equipment Utilized During Treatment: Gait belt Activity Tolerance: Patient tolerated treatment well Patient left: in bed;with call bell/phone within reach;with bed alarm set Nurse Communication: Mobility status PT Visit Diagnosis: Unsteadiness on feet (R26.81);Muscle weakness (generalized) (M62.81);Difficulty in walking, not elsewhere classified (R26.2)     Time: 3557-3220 PT Time Calculation (min) (ACUTE ONLY): 20 min  Charges:  $Gait Training: 8-22 mins                     Abran Richard, PT Acute Rehab Services Pager [phone] [facility] [place] 11/23/2020, 5:04 PM

## 2020-11-23 NOTE — Progress Notes (Signed)
Pharmacy Antibiotic Note  Ryan Tyler is a 87 y.o. male admitted on 11/20/2020 with aspiration PNA.  Pharmacy has been consulted for Unasyn dosing.   ID: COVID+, wbc wnl, AFeb. CRP trending down. Ferritin WNL. Ddimer 9.3 PCT 4.68 AKI  Unasyn 2/2>>  Plan: Unasyn 1.5g IV q8hr Need height to clarify accurate CrCl.    Weight: 71.8 kg (158 lb 4.6 oz)  Temp (24hrs), Avg:97.8 F (36.6 C), Min:97.6 F (36.4 C), Max:98 F (36.7 C)  Recent Labs  Lab 11/20/20 0308 11/21/20 0150 11/22/20 0221 11/23/20 0303  WBC 10.1 7.1 7.0 12.5*  CREATININE 1.68* 1.30* 1.23 1.07    CrCl cannot be calculated (Unknown ideal weight.).    No Known Allergies  Ryan Tyler, PharmD, BCPS Clinical Staff Pharmacist [email or web address]  Wayland Salinas 11/23/2020 3:11 PM

## 2020-11-23 NOTE — Evaluation (Signed)
Clinical/Bedside Swallow Evaluation Patient Details  Name: Ryan Tyler MRN: [id number] Date of Birth: 05-19-1928  Today's Date: 11/23/2020 Time: SLP Start Time (ACUTE ONLY): 1535 SLP Stop Time (ACUTE ONLY): 1546 SLP Time Calculation (min) (ACUTE ONLY): 11 min  Past Medical History:  Past Medical History:  Diagnosis Date  . Asymptomatic cholelithiasis   . Bladder tumor   . BPH (benign prostatic hypertrophy)   . Cancer ([place])    bladder  . GERD (gastroesophageal reflux disease)   . Hearing loss   . HOH (hard of hearing)    no hearing aids  . Hyperlipidemia   . Hypertension   . Kidney stones   . Mild left inguinal hernia   . Nocturia   . Urgency of urination    Past Surgical History:  Past Surgical History:  Procedure Laterality Date  . CATARACT EXTRACTION W/ INTRAOCULAR LENS IMPLANT Left nov  2014  . CHOLECYSTECTOMY N/A 08/16/2015   Procedure: LAPAROSCOPIC CHOLECYSTECTOMY;  Surgeon: Rolm Bookbinder, MD;  Location: [facility];  Service: General;  Laterality: N/A;  . TONSILLECTOMY    . TRANSURETHRAL RESECTION OF BLADDER TUMOR WITH GYRUS (TURBT-GYRUS) N/A 04/05/2014   Procedure: TRANSURETHRAL RESECTION OF BLADDER TUMOR WITH GYRUS  AND INTRAVESICO CHEMO;  Surgeon: Claybon Jabs, MD;  Location: [facility];  Service: Urology;  Laterality: N/A;  . TRANSURETHRAL RESECTION OF PROSTATE  1995   HPI:  Pt is a 87 y.o. male with PMHx of HTN, HLD, EtOH use, alcoholic pancreatitis, dementia, bladder cancer s/p TURBT who presented with fever, poor oral intake-he was found to have AKI, COVID-19 infection. CXR 1/30: Mild right lung atelectatic changes. MBS 01/30/19: trace silent penetration during consecutive swallows of thin liquids and trace silent aspiration of oral residuals spilling to pyriforms post swallow. Pt would not attempt a chin tuck despite cueing. A throat clear was successful in clearing aspirate, but pt needed excessive cueing to understand SLP instruction. Quantity of  aspiration was judged to be minimal and a dysphagia 2 diet with thin liquids was recommended with observance of swallowing precautions. It was noted that during that MBS, " max encouragement needed for patient participation given grumpiness, confusion and poor hearing ability."   Assessment / Plan / Recommendation Clinical Impression  Pt is known to speech pathology and was most recently seen in April, 2020 for a modified barium swallow study which necessitated "max encouragement for patient participation given grumpiness, confusion and poor hearing ability." Pt presented similarly during today's evaluation. He required significant encouragement to participate in the evaluation and accept trials. Provision of trials was met with shouting, swearing, stating "no", and pt shaking his head to avoid boluses reaching his lips. Pt did not participate in an oral mechanism exam due to cooperation and difficulty following commands, but dentition appeared reduced and in poor condition. Trials were limited to a single bolus of thin liquids via cup and a single  1/2 tsp bolus of puree solids. Pt exhibited coughing with the puree bolus, but tolerated thin liquids via cup without overt s/sx of aspiration when he self-fed. Pt's RN Desiray reported that the pt has been exhibiting coughing across consistencies. Pt's diet will be downgraded to dysphagia 2 with thin liquids (no straws) based on the results of the most recent instrumental evaluation, but SLP will follow for further assessment of trials. SLP Visit Diagnosis: Dysphagia, unspecified (R13.10)    Aspiration Risk  Mild aspiration risk;Moderate aspiration risk    Diet Recommendation Dysphagia 2 (Fine chop);Thin liquid  Liquid Administration via: Cup;No straw Medication Administration: Crushed with puree Supervision: Staff to assist with self feeding Compensations: Slow rate;Small sips/bites;Minimize environmental distractions;Clear throat after each  swallow Postural Changes: Seated upright at 90 degrees    Other  Recommendations Oral Care Recommendations: Oral care BID   Follow up Recommendations  (TBD)      Frequency and Duration min 2x/week  2 weeks       Prognosis Prognosis for Safe Diet Advancement: Fair Barriers to Reach Goals: Cognitive deficits      Swallow Study   General Date of Onset: 11/22/20 HPI: Pt is a 87 y.o. male with PMHx of HTN, HLD, EtOH use, alcoholic pancreatitis, dementia, bladder cancer s/p TURBT who presented with fever, poor oral intake-he was found to have AKI, COVID-19 infection. CXR 1/30: Mild right lung atelectatic changes. MBS 01/30/19: trace silent penetration during consecutive swallows of thin liquids and trace silent aspiration of oral residuals spilling to pyriforms post swallow. Pt would not attempt a chin tuck despite cueing. A throat clear was successful in clearing aspirate, but pt needed excessive cueing to understand SLP instruction. Quantity of aspiration was judged to be minimal and a dysphagia 2 diet with thin liquids was recommended with observance of swallowing precautions. It was noted that during that MBS, " max encouragement needed for patient participation given grumpiness, confusion and poor hearing ability." Type of Study: Bedside Swallow Evaluation Previous Swallow Assessment: See HPI Diet Prior to this Study: Regular;Thin liquids Temperature Spikes Noted: No Respiratory Status: Room air History of Recent Intubation: No Behavior/Cognition: Alert;Cooperative;Confused Oral Cavity Assessment: Within Functional Limits Oral Care Completed by SLP: No Oral Cavity - Dentition: Missing dentition;Poor condition Vision: Functional for self-feeding Self-Feeding Abilities: Needs assist Patient Positioning: Upright in bed;Postural control adequate for testing Baseline Vocal Quality: Normal Volitional Cough: Weak;Congested Volitional Swallow: Unable to elicit    Oral/Motor/Sensory  Function Overall Oral Motor/Sensory Function:  (Pt did not cooperate with assessment)   Ice Chips Ice chips: Within functional limits Presentation: Spoon   Thin Liquid Thin Liquid: Within functional limits Presentation: Cup    Nectar Thick Nectar Thick Liquid: Not tested   Honey Thick Honey Thick Liquid: Not tested   Puree Puree: Impaired Presentation: Spoon Pharyngeal Phase Impairments: Cough - Immediate   Solid     Solid: Not tested (pt refused)     Garritt Molyneux I. Hardin Negus, [place], [place] Office number [phone] Pager [place] 11/23/2020,4:56 PM

## 2020-11-24 ENCOUNTER — Inpatient Hospital Stay (HOSPITAL_COMMUNITY): Payer: Medicare Other

## 2020-11-24 DIAGNOSIS — G309 Alzheimer's disease, unspecified: Secondary | ICD-10-CM | POA: Diagnosis not present

## 2020-11-24 DIAGNOSIS — R7989 Other specified abnormal findings of blood chemistry: Secondary | ICD-10-CM

## 2020-11-24 DIAGNOSIS — U071 COVID-19: Secondary | ICD-10-CM | POA: Diagnosis not present

## 2020-11-24 DIAGNOSIS — G9349 Other encephalopathy: Secondary | ICD-10-CM | POA: Diagnosis not present

## 2020-11-24 DIAGNOSIS — N179 Acute kidney failure, unspecified: Secondary | ICD-10-CM | POA: Diagnosis not present

## 2020-11-24 LAB — CBC WITH DIFFERENTIAL/PLATELET
Abs Immature Granulocytes: 0.19 10*3/uL — ABNORMAL HIGH (ref 0.00–0.07)
Basophils Absolute: 0 10*3/uL (ref 0.0–0.1)
Basophils Relative: 0 %
Eosinophils Absolute: 0 10*3/uL (ref 0.0–0.5)
Eosinophils Relative: 0 %
HCT: 50 % (ref 39.0–52.0)
Hemoglobin: 15.4 g/dL (ref 13.0–17.0)
Immature Granulocytes: 1 %
Lymphocytes Relative: 4 %
Lymphs Abs: 0.6 10*3/uL — ABNORMAL LOW (ref 0.7–4.0)
MCH: 25.3 pg — ABNORMAL LOW (ref 26.0–34.0)
MCHC: 30.8 g/dL (ref 30.0–36.0)
MCV: 82.2 fL (ref 80.0–100.0)
Monocytes Absolute: 1.2 10*3/uL — ABNORMAL HIGH (ref 0.1–1.0)
Monocytes Relative: 7 %
Neutro Abs: 15.3 10*3/uL — ABNORMAL HIGH (ref 1.7–7.7)
Neutrophils Relative %: 88 %
Platelets: 167 10*3/uL (ref 150–400)
RBC: 6.08 MIL/uL — ABNORMAL HIGH (ref 4.22–5.81)
RDW: 16.6 % — ABNORMAL HIGH (ref 11.5–15.5)
WBC: 17.3 10*3/uL — ABNORMAL HIGH (ref 4.0–10.5)
nRBC: 0 % (ref 0.0–0.2)

## 2020-11-24 LAB — MAGNESIUM: Magnesium: 1.9 mg/dL (ref 1.7–2.4)

## 2020-11-24 LAB — COMPREHENSIVE METABOLIC PANEL
ALT: 24 U/L (ref 0–44)
AST: 45 U/L — ABNORMAL HIGH (ref 15–41)
Albumin: 3.2 g/dL — ABNORMAL LOW (ref 3.5–5.0)
Alkaline Phosphatase: 87 U/L (ref 38–126)
Anion gap: 13 (ref 5–15)
BUN: 34 mg/dL — ABNORMAL HIGH (ref 8–23)
CO2: 21 mmol/L — ABNORMAL LOW (ref 22–32)
Calcium: 8.8 mg/dL — ABNORMAL LOW (ref 8.9–10.3)
Chloride: 105 mmol/L (ref 98–111)
Creatinine, Ser: 1.29 mg/dL — ABNORMAL HIGH (ref 0.61–1.24)
GFR, Estimated: 52 mL/min — ABNORMAL LOW (ref >60)
Glucose, Bld: 203 mg/dL — ABNORMAL HIGH (ref 70–99)
Potassium: 4.5 mmol/L (ref 3.5–5.1)
Sodium: 139 mmol/L (ref 135–145)
Total Bilirubin: 1.3 mg/dL — ABNORMAL HIGH (ref 0.3–1.2)
Total Protein: 5.8 g/dL — ABNORMAL LOW (ref 6.5–8.1)

## 2020-11-24 LAB — C-REACTIVE PROTEIN: CRP: 8.6 mg/dL — ABNORMAL HIGH (ref <1.0)

## 2020-11-24 LAB — D-DIMER, QUANTITATIVE: D-Dimer, Quant: 3.43 ug/mL-FEU — ABNORMAL HIGH (ref 0.00–0.50)

## 2020-11-24 LAB — FERRITIN: Ferritin: 51 ng/mL (ref 24–336)

## 2020-11-24 LAB — PHOSPHORUS: Phosphorus: 2.8 mg/dL (ref 2.5–4.6)

## 2020-11-24 MED ORDER — CHLORDIAZEPOXIDE HCL 5 MG PO CAPS
10.0000 mg | ORAL_CAPSULE | Freq: Two times a day (BID) | ORAL | Status: DC
  Administered 2020-11-25: 10 mg via ORAL
  Filled 2020-11-24 (×2): qty 2

## 2020-11-24 MED ORDER — LACTATED RINGERS IV SOLN: INTRAVENOUS | Status: AC

## 2020-11-24 NOTE — Progress Notes (Addendum)
PROGRESS NOTE                                                                                                                                                                                                             Patient Demographics:    Ryan Tyler, is a 87 y.o. male, DOB - October 12, 1928, [id number]  Outpatient Primary MD for the patient is Josetta Huddle, MD   Admit date - 11/20/2020   LOS - 4  Chief Complaint  Patient presents with  . Fever       Brief Narrative: Patient is a 87 y.o. male with PMHx of HTN, HLD, EtOH use, alcoholic pancreatitis, dementia, bladder cancer s/p TURBT-presenting with fever, poor oral intake-he was found to have AKI, COVID-19 infection-and subsequently admitted to the hospitalist service.  Further hospital stay was complicated by delirium/alcohol withdrawal.  COVID-19 vaccinated status: Vaccinated  Significant Events: 1/30>> Admit to [facility] for AKI/COVID-19 infection  Significant studies: 1/30>>Chest x-ray: Mild right lung atelectasis 2/3>> chest x-ray: Right/left midlung infiltrates. 2/3>> bilateral lower extremity Doppler: Negative for DVT  COVID-19 medications: Steroids: 1/30>> Remdesivir:1/30>>  Antibiotics: Unasyn: 2/2>>  Microbiology data: None  Procedures: None  Consults: None  DVT prophylaxis: enoxaparin (LOVENOX) injection 40 mg Start: 11/20/20 1600    Subjective:   Lethargic but more alert-answers most of my questions appropriately.  Pushing me away because I kept asking him questions this morning.   Assessment  & Plan :   Acute metabolic encephalopathy: Multifactorial etiology- hospital delirium, alcohol withdrawal, COVID-19 likely culprits superimposed on dementia.  Seems to be slowly improving-still lethargic but more awake and alert compared to yesterday.  Answering most of my questions appropriately.  AKI:-Had improved-but has reoccurred-likely  hemodynamically mediated-probably due to poor oral intake-start IVF-and reassess tomorrow.    EtOH dependence/withdrawl: Confused-drinks at least 2 bottles of beer and 2 drinks of liquor on a daily basis-family does not want to try Ativan-seems to be tolerating Librium fairly well-continue slow taper.    Pneumonia due to COVID-19 and aspiration/bacterial: Remains on room air-still significantly congested-on empiric treatment with Unasyn/steroid/Remdesivir.  Appreciate SLP evaluation-on dysphagia 2 diet.  Follow closely.  Fever: afebrile O2 requirements:  SpO2: 92 %   COVID-19 Labs: Recent Labs    11/22/20 0221 11/23/20 0303 11/24/20 0114  DDIMER 2.00* 3.51* 3.43*  FERRITIN 54 47 51  CRP 9.3* 4.1* 8.6*    No results found for: BNP  Recent Labs  Lab 11/20/20 1146  PROCALCITON 4.68    Lab Results  Component Value Date   SARSCOV2NAA POSITIVE (A) 11/20/2020   [place] NEGATIVE 05/24/2020   SARSCOV2NAA NOT DETECTED 01/23/2019     Elevated D-dimer: Due to COVID-19 related inflammation-lower extremity Dopplers negative-on prophylactic heparin.  Follow closely.  HTN: BP stable-lisinopril/HCTZ on hold.  GERD: Continue PPI  History of suspected COPD: Continue bronchodilators  History of bladder cancer  Hard of hearing  Severe deconditioning/debility-May require SNF-await further input from PT/OT.  Palliative care: DNR in [place] frail/debilitated-with multifactorial etiology-suspected aspiration-plans are to continue with medical care as outlined above-unfortunately if he continues to aspirate-then we might need to talk about further delineation of goals of care with family.  GI prophylaxis: PPI  ABG:    Component Value Date/Time   HCO3 22.1 01/23/2019 2015   TCO2 23 01/23/2019 2015   ACIDBASEDEF 4.0 (H) 01/23/2019 2015   O2SAT 78.0 01/23/2019 2015    Vent Settings: N/A  Condition -Guarded  Family Communication  :  Son-Azael [phone] left  voicemail on 2/3  Code Status : DNR  Diet :  Diet Order            DIET DYS 2 Room service appropriate? Yes with Assist; Fluid consistency: Thin  Diet effective now                  Disposition Plan  :   Status is: Inpatient  Remains inpatient appropriate because:Inpatient level of care appropriate due to severity of illness   Dispo: The patient is from: Home              Anticipated d/c is to: Home              Anticipated d/c date is: > 3 days              Patient currently is not medically stable to d/c.   Difficult to place patient No   Barriers to discharge: Ongoing encephalopathy-not yet at baseline.  Antimicorbials  :    Anti-infectives (From admission, onward)   Start     Dose/Rate Route Frequency Ordered Stop   11/23/20 1600  ampicillin-sulbactam (UNASYN) 1.5 g in sodium chloride 0.9 % 100 mL IVPB        1.5 g 200 mL/hr over 30 Minutes Intravenous Every 8 hours 11/23/20 1510     11/21/20 1000  remdesivir 100 mg in sodium chloride 0.9 % 100 mL IVPB       "Followed by" Linked Group Details   100 mg 200 mL/hr over 30 Minutes Intravenous Daily 11/20/20 0633 11/24/20 0959   11/20/20 0730  remdesivir 200 mg in sodium chloride 0.9% 250 mL IVPB       "Followed by" Linked Group Details   200 mg 580 mL/hr over 30 Minutes Intravenous Once 11/20/20 4081 11/20/20 0900      Inpatient Medications  Scheduled Meds: . vitamin C  500 mg Oral Daily  . benzonatate  200 mg Oral TID  . chlordiazePOXIDE  10 mg Oral TID  . docusate sodium  100 mg Oral BID  . enoxaparin (LOVENOX) injection  40 mg Subcutaneous Daily  . fluticasone furoate-vilanterol  1 puff Inhalation Daily  . folic acid  1 mg Oral Daily  . guaiFENesin  600 mg Oral BID  . methylPREDNISolone (SOLU-MEDROL) injection  40 mg Intravenous BID  . multivitamin with minerals  1 tablet Oral Daily  . pantoprazole  40 mg Oral Daily  . sodium chloride flush  3 mL Intravenous Q12H  . sodium chloride flush  3 mL  Intravenous Q12H  . umeclidinium bromide  1 puff Inhalation Daily  . zinc sulfate  220 mg Oral Daily   Continuous Infusions: . sodium chloride    . ampicillin-sulbactam (UNASYN) IV Stopped (11/24/20 0915)  . lactated ringers 50 mL/hr at 11/24/20 1000  . thiamine injection Stopped (11/24/20 1035)   PRN Meds:.sodium chloride, acetaminophen, albuterol, bisacodyl, chlorpheniramine-HYDROcodone, ondansetron **OR** ondansetron (ZOFRAN) IV, polyethylene glycol, sodium chloride flush, sodium phosphate   Time Spent in minutes  25  See all Orders from today for further details   Oren Binet M.D on 11/24/2020 at 12:44 PM  To page go to [email or web address] - use universal password  [facility] -  Office  [phone]    Objective:   Vitals:   11/24/20 0001 11/24/20 0334 11/24/20 0735 11/24/20 1148  BP:  137/74 (!) 152/66 (!) 134/109  Pulse: 90 98 96 95  Resp: 20 20 20 20   Temp: 97.6 F (36.4 C) (!) 97.5 F (36.4 C) 97.6 F (36.4 C) 97.7 F (36.5 C)  TempSrc: Axillary Axillary Oral Oral  SpO2: 96% 90% 91% 92%  Weight:        Wt Readings from Last 3 Encounters:  11/20/20 71.8 kg  05/24/20 77.1 kg  01/31/19 78.1 kg     Intake/Output Summary (Last 24 hours) at 11/24/2020 1244 Last data filed at 11/24/2020 1035 Gross per 24 hour  Intake 733.02 ml  Output 575 ml  Net 158.02 ml     Physical Exam Gen Exam: Lethargic/sleepy but arouses to loud verbal stimuli.  Not in any distress.  Answers questions appropriately.  HEENT:atraumatic, normocephalic Chest: Some transmitted upper airway sounds-but otherwise clear to auscultation. CVS:S1S2 regular Abdomen:soft non tender, non distended Extremities:no edema Neurology: Generalized weakness-but nonfocal. Skin: no rash   Data Review:    CBC Recent Labs  Lab 11/20/20 0308 11/21/20 0150 11/22/20 0221 11/23/20 0303 11/24/20 0114  WBC 10.1 7.1 7.0 12.5* 17.3*  HGB 15.2 12.7* 13.8 14.7 15.4  HCT 52.4* 38.9* 46.4 46.8 50.0   PLT 146* 111* 128* 144* 167  MCV 87.2 81.9 84.1 82.1 82.2  MCH 25.3* 26.7 25.0* 25.8* 25.3*  MCHC 29.0* 32.6 29.7* 31.4 30.8  RDW 15.9* 15.9* 15.6* 15.9* 16.6*  LYMPHSABS  --  0.3* 0.2* 0.2* 0.6*  MONOABS  --  0.2 0.4 0.9 1.2*  EOSABS  --  0.0 0.0 0.0 0.0  BASOSABS  --  0.0 0.0 0.0 0.0    Chemistries  Recent Labs  Lab 11/20/20 0308 11/20/20 0709 11/21/20 0150 11/22/20 0221 11/23/20 0303 11/24/20 0114  NA 139  --  137 136 136 139  K 4.4  --  4.2 3.3* 4.9 4.5  CL 99  --  104 99 101 105  CO2 22  --  22 24 23  21*  GLUCOSE 186*  --  187* 150* 244* 203*  BUN 37*  --  43* 39* 31* 34*  CREATININE 1.68*  --  1.30* 1.23 1.07 1.29*  CALCIUM 8.9  --  8.6* 8.4* 9.0 8.8*  MG  --   --  1.6* 1.6* 1.9 1.9  AST  --  32 23 29 38 45*  ALT  --  19 15 18 19 24   ALKPHOS  --  130* 92 93 89 87  BILITOT  --  1.7* 0.9 1.2 1.1 1.3*   ------------------------------------------------------------------------------------------------------------------  No results for input(s): CHOL, HDL, LDLCALC, TRIG, CHOLHDL, LDLDIRECT in the last 72 hours.  Lab Results  Component Value Date   HGBA1C 5.9 (H) 05/24/2020   ------------------------------------------------------------------------------------------------------------------ No results for input(s): TSH, T4TOTAL, T3FREE, THYROIDAB in the last 72 hours.  Invalid input(s): FREET3 ------------------------------------------------------------------------------------------------------------------ Recent Labs    11/23/20 0303 11/24/20 0114  FERRITIN 47 51    Coagulation profile No results for input(s): INR, PROTIME in the last 168 hours.  Recent Labs    11/23/20 0303 11/24/20 0114  DDIMER 3.51* 3.43*    Cardiac Enzymes No results for input(s): CKMB, TROPONINI, MYOGLOBIN in the last 168 hours.  Invalid input(s): CK ------------------------------------------------------------------------------------------------------------------ No results found  for: BNP  Micro Results Recent Results (from the past 240 hour(s))  SARS Coronavirus 2 by RT PCR (hospital order, performed in [facility] hospital lab) Nasopharyngeal Nasopharyngeal Swab     Status: Abnormal   Collection Time: 11/20/20  2:59 AM   Specimen: Nasopharyngeal Swab  Result Value Ref Range Status   SARS Coronavirus 2 POSITIVE (A) NEGATIVE Final    Comment: RESULT CALLED TO, READ BACK BY AND VERIFIED WITH: Thurman Coyer IN:[id number] 11/20/2020 T. TYSOR (NOTE) SARS-CoV-2 target nucleic acids are DETECTED  SARS-CoV-2 RNA is generally detectable in upper respiratory specimens  during the acute phase of infection.  Positive results are indicative  of the presence of the identified virus, but do not rule out bacterial infection or co-infection with other pathogens not detected by the test.  Clinical correlation with patient history and  other diagnostic information is necessary to determine patient infection status.  The expected result is negative.  Fact Sheet for Patients:   StrictlyIdeas.no   Fact Sheet for Healthcare Providers:   BankingDealers.co.za    This test is not yet approved or cleared by the [place] FDA and  has been authorized for detection and/or diagnosis of SARS-CoV-2 by FDA under an Emergency Use Authorization (EUA).  This EUA will remain in effect (meaning thi s test can be used) for the duration of  the COVID-19 declaration under Section 564(b)(1) of the Act, 21 U.S.C. section 360-bbb-3(b)(1), unless the authorization is terminated or revoked sooner.  Performed at [facility], [place] [address] [place], [place] [address]     Radiology Reports DG Chest [place] 1 View  Result Date: 11/24/2020 CLINICAL DATA:  Shortness of breath. EXAM: PORTABLE CHEST 1 VIEW COMPARISON:  11/20/2020.  05/24/2020.  01/23/2019. FINDINGS: Mediastinum and hilar structures normal. Heart size normal. Low lung volumes with bibasilar  atelectasis. Pleuroparenchymal thickening right upper lung suggesting scarring. Mild diffuse right lung and left mid lung interstitial infiltrates. No pleural effusion or pneumothorax. Surgical clips right upper abdomen. IMPRESSION: 1. Mild diffuse right lung and left mid lung interstitial infiltrates. 2. Low lung volumes with bibasilar atelectasis. Probable right upper lung pleural-parenchymal scarring. Electronically Signed   By: Marcello Moores  Register   On: 11/24/2020 06:43   DG Chest Portable 1 View  Result Date: 11/20/2020 CLINICAL DATA:  Shortness of breath and fevers EXAM: PORTABLE CHEST 1 VIEW COMPARISON:  05/24/2020 FINDINGS: Cardiac shadow is stable. Aortic calcifications are noted. Left lung is clear. Right lung demonstrates some upper lobe atelectatic changes. No bony abnormality is seen. IMPRESSION: Mild right lung atelectatic changes. Electronically Signed   By: Inez Catalina M.D.   On: 11/20/2020 03:18   VAS [place] LOWER EXTREMITY VENOUS (DVT)  Result Date: 11/24/2020  Lower Venous DVT Study Indications: Covid +ve, increasing d dimer.  Comparison Study: no  prior Performing Technologist: Abram Sander RVS  Examination Guidelines: A complete evaluation includes B-mode imaging, spectral Doppler, color Doppler, and power Doppler as needed of all accessible portions of each vessel. Bilateral testing is considered an integral part of a complete examination. Limited examinations for reoccurring indications may be performed as noted. The reflux portion of the exam is performed with the patient in reverse Trendelenburg.  +---------+---------------+---------+-----------+----------+--------------+ RIGHT    CompressibilityPhasicitySpontaneityPropertiesThrombus Aging +---------+---------------+---------+-----------+----------+--------------+ CFV      Full           Yes      Yes                                 +---------+---------------+---------+-----------+----------+--------------+ SFJ      Full                                                         +---------+---------------+---------+-----------+----------+--------------+ FV Prox  Full                                                        +---------+---------------+---------+-----------+----------+--------------+ FV Mid   Full                                                        +---------+---------------+---------+-----------+----------+--------------+ FV DistalFull                                                        +---------+---------------+---------+-----------+----------+--------------+ PFV      Full                                                        +---------+---------------+---------+-----------+----------+--------------+ POP      Full           Yes      Yes                                 +---------+---------------+---------+-----------+----------+--------------+ PTV      Full                                                        +---------+---------------+---------+-----------+----------+--------------+ PERO     Full                                                        +---------+---------------+---------+-----------+----------+--------------+   +---------+---------------+---------+-----------+----------+--------------+  LEFT     CompressibilityPhasicitySpontaneityPropertiesThrombus Aging +---------+---------------+---------+-----------+----------+--------------+ CFV      Full           Yes      Yes                                 +---------+---------------+---------+-----------+----------+--------------+ SFJ      Full                                                        +---------+---------------+---------+-----------+----------+--------------+ FV Prox  Full                                                        +---------+---------------+---------+-----------+----------+--------------+ FV Mid   Full                                                         +---------+---------------+---------+-----------+----------+--------------+ FV DistalFull                                                        +---------+---------------+---------+-----------+----------+--------------+ PFV      Full                                                        +---------+---------------+---------+-----------+----------+--------------+ POP      Full           Yes      Yes                                 +---------+---------------+---------+-----------+----------+--------------+ PTV      Full                                                        +---------+---------------+---------+-----------+----------+--------------+ PERO     Full                                                        +---------+---------------+---------+-----------+----------+--------------+     Summary: BILATERAL: - No evidence of deep vein thrombosis seen in the lower extremities, bilaterally. - No evidence of superficial venous thrombosis in the lower extremities, bilaterally. -No evidence of popliteal cyst, bilaterally.   *See table(s) above for measurements and observations.  Preliminary

## 2020-11-24 NOTE — Progress Notes (Signed)
Lower extremity venous has been completed.   Preliminary results in CV Proc.   Ryan Tyler 11/24/2020 9:58 AM

## 2020-11-24 NOTE — Progress Notes (Signed)
  Speech Language Pathology Treatment: Dysphagia  Patient Details Name: Ryan Tyler MRN: [id number] DOB: 1928/01/08 Today's Date: 11/24/2020 Time: 1829-9371 SLP Time Calculation (min) (ACUTE ONLY): 13 min  Assessment / Plan / Recommendation Clinical Impression  Pt was seen for dysphagia treatment. He was asleep upon SLP's arrival, but roused with verbal and tactile stimulation. Limited oral care was provided due to pt's cooperation. Pt was seen during lunch in an attempt to facilitate improved cooperation with p.o. trials. The initial bolus of ground pork sat in his mouth with no attempts at bolus manipulation or swallowing. Boluses were ultimately removed from the oral cavity with suction and pt refused all subsequent boluses despite encouragement. Per RN, pt has refused all p.o. intake and medication today. Pt's poor cooperation has been a barrier to thorough assessment of his swallowing, however, he was symptomatic of aspiration with puree during the evaluation and pt's RN reported signs of aspiration across consistencies on 2/2. Given his presentation at bedside, SLP does not anticipate that pt will participate in a modified barium swallow study at this time. SLP will continue to diligently attempt assessment of this pt. However, considering his cognition (dementia), history of dysphagia, presentation with limited boluses on 2/2, and reports from RN, aspiration is a high likelihood.    HPI HPI: Pt is a 87 y.o. male with PMHx of HTN, HLD, EtOH use, alcoholic pancreatitis, dementia, bladder cancer s/p TURBT who presented with fever, poor oral intake-he was found to have AKI, COVID-19 infection. CXR 1/30: Mild right lung atelectatic changes. MBS 01/30/19: trace silent penetration during consecutive swallows of thin liquids and trace silent aspiration of oral residuals spilling to pyriforms post swallow. Pt would not attempt a chin tuck despite cueing. A throat clear was successful in clearing aspirate,  but pt needed excessive cueing to understand SLP instruction. Quantity of aspiration was judged to be minimal and a dysphagia 2 diet with thin liquids was recommended with observance of swallowing precautions. It was noted that during that MBS, " max encouragement needed for patient participation given grumpiness, confusion and poor hearing ability."      SLP Plan  Continue with current plan of care       Recommendations  Diet recommendations: Dysphagia 2 (fine chop);Thin liquid Liquids provided via: Cup;Straw Medication Administration: Crushed with puree Supervision: Staff to assist with self feeding;Full supervision/cueing for compensatory strategies Compensations: Slow rate;Small sips/bites;Minimize environmental distractions;Clear throat after each swallow Postural Changes and/or Swallow Maneuvers: Seated upright 90 degrees                Oral Care Recommendations: Oral care BID Follow up Recommendations:  (TBD) SLP Visit Diagnosis: Dysphagia, unspecified (R13.10) Plan: Continue with current plan of care       Ariyona Eid I. Hardin Negus, [place], [place] Office number [phone] Pager [phone]                Horton Marshall 11/24/2020, 3:35 PM

## 2020-11-24 NOTE — Care Management Important Message (Signed)
Important Message  Patient Details  Name: Ryan Tyler MRN: [id number] Date of Birth: 1928/04/23   Medicare Important Message Given:  Yes - Important Message mailed due to current National Emergency  Verbal consent obtained due to current National Emergency  Relationship to patient: Self Contact Name: Layden Caterino Call Date: 11/24/20  Time: 9163 Phone: [phone] Outcome: No Answer/Busy Important Message mailed to: Patient address on file   Delorse Lek 11/24/2020, 2:26 PM

## 2020-11-25 DIAGNOSIS — N179 Acute kidney failure, unspecified: Secondary | ICD-10-CM | POA: Diagnosis not present

## 2020-11-25 DIAGNOSIS — G309 Alzheimer's disease, unspecified: Secondary | ICD-10-CM | POA: Diagnosis not present

## 2020-11-25 DIAGNOSIS — G9349 Other encephalopathy: Secondary | ICD-10-CM | POA: Diagnosis not present

## 2020-11-25 DIAGNOSIS — U071 COVID-19: Secondary | ICD-10-CM | POA: Diagnosis not present

## 2020-11-25 LAB — COMPREHENSIVE METABOLIC PANEL
ALT: 23 U/L (ref 0–44)
AST: 30 U/L (ref 15–41)
Albumin: 2.5 g/dL — ABNORMAL LOW (ref 3.5–5.0)
Alkaline Phosphatase: 72 U/L (ref 38–126)
Anion gap: 12 (ref 5–15)
BUN: 35 mg/dL — ABNORMAL HIGH (ref 8–23)
CO2: 23 mmol/L (ref 22–32)
Calcium: 8.4 mg/dL — ABNORMAL LOW (ref 8.9–10.3)
Chloride: 106 mmol/L (ref 98–111)
Creatinine, Ser: 1.26 mg/dL — ABNORMAL HIGH (ref 0.61–1.24)
GFR, Estimated: 54 mL/min — ABNORMAL LOW (ref >60)
Glucose, Bld: 175 mg/dL — ABNORMAL HIGH (ref 70–99)
Potassium: 4.6 mmol/L (ref 3.5–5.1)
Sodium: 141 mmol/L (ref 135–145)
Total Bilirubin: 1.6 mg/dL — ABNORMAL HIGH (ref 0.3–1.2)
Total Protein: 4.7 g/dL — ABNORMAL LOW (ref 6.5–8.1)

## 2020-11-25 LAB — CBC WITH DIFFERENTIAL/PLATELET
Abs Immature Granulocytes: 0.07 10*3/uL (ref 0.00–0.07)
Basophils Absolute: 0 10*3/uL (ref 0.0–0.1)
Basophils Relative: 0 %
Eosinophils Absolute: 0 10*3/uL (ref 0.0–0.5)
Eosinophils Relative: 0 %
HCT: 43.9 % (ref 39.0–52.0)
Hemoglobin: 13.5 g/dL (ref 13.0–17.0)
Immature Granulocytes: 1 %
Lymphocytes Relative: 2 %
Lymphs Abs: 0.2 10*3/uL — ABNORMAL LOW (ref 0.7–4.0)
MCH: 25.5 pg — ABNORMAL LOW (ref 26.0–34.0)
MCHC: 30.8 g/dL (ref 30.0–36.0)
MCV: 82.8 fL (ref 80.0–100.0)
Monocytes Absolute: 1.2 10*3/uL — ABNORMAL HIGH (ref 0.1–1.0)
Monocytes Relative: 10 %
Neutro Abs: 10 10*3/uL — ABNORMAL HIGH (ref 1.7–7.7)
Neutrophils Relative %: 87 %
Platelets: 139 10*3/uL — ABNORMAL LOW (ref 150–400)
RBC: 5.3 MIL/uL (ref 4.22–5.81)
RDW: 16 % — ABNORMAL HIGH (ref 11.5–15.5)
WBC: 11.5 10*3/uL — ABNORMAL HIGH (ref 4.0–10.5)
nRBC: 0 % (ref 0.0–0.2)

## 2020-11-25 LAB — MAGNESIUM: Magnesium: 1.7 mg/dL (ref 1.7–2.4)

## 2020-11-25 LAB — C-REACTIVE PROTEIN: CRP: 11.9 mg/dL — ABNORMAL HIGH (ref <1.0)

## 2020-11-25 LAB — FERRITIN: Ferritin: 44 ng/mL (ref 24–336)

## 2020-11-25 LAB — D-DIMER, QUANTITATIVE: D-Dimer, Quant: 2.51 ug/mL-FEU — ABNORMAL HIGH (ref 0.00–0.50)

## 2020-11-25 LAB — PHOSPHORUS: Phosphorus: 2.5 mg/dL (ref 2.5–4.6)

## 2020-11-25 MED ORDER — SPIRITUS FRUMENTI
1.0000 | Freq: Two times a day (BID) | ORAL | Status: DC
  Administered 2020-11-25 – 2020-12-03 (×12): 1 via ORAL
  Filled 2020-11-25 (×22): qty 1

## 2020-11-25 MED ORDER — HALOPERIDOL LACTATE 5 MG/ML IJ SOLN
2.0000 mg | Freq: Four times a day (QID) | INTRAMUSCULAR | Status: DC | PRN
  Administered 2020-11-25 – 2020-11-28 (×9): 2 mg via INTRAVENOUS
  Filled 2020-11-25 (×9): qty 1

## 2020-11-25 MED ORDER — CHLORDIAZEPOXIDE HCL 5 MG PO CAPS
5.0000 mg | ORAL_CAPSULE | Freq: Two times a day (BID) | ORAL | Status: AC
  Administered 2020-11-25 – 2020-11-26 (×2): 5 mg via ORAL
  Filled 2020-11-25 (×2): qty 1

## 2020-11-25 MED ORDER — RESOURCE THICKENUP CLEAR PO POWD
ORAL | Status: DC | PRN
  Filled 2020-11-25: qty 125

## 2020-11-25 MED ORDER — THIAMINE HCL 100 MG PO TABS
100.0000 mg | ORAL_TABLET | Freq: Every day | ORAL | Status: DC
  Administered 2020-11-26 – 2020-11-29 (×3): 100 mg via ORAL
  Filled 2020-11-25 (×3): qty 1

## 2020-11-25 MED ORDER — QUETIAPINE FUMARATE 25 MG PO TABS
25.0000 mg | ORAL_TABLET | Freq: Every day | ORAL | Status: DC
  Administered 2020-11-25 – 2020-12-03 (×8): 25 mg via ORAL
  Filled 2020-11-25 (×8): qty 1

## 2020-11-25 MED ORDER — PREDNISONE 20 MG PO TABS
40.0000 mg | ORAL_TABLET | Freq: Every day | ORAL | Status: DC
  Administered 2020-11-26: 40 mg via ORAL
  Filled 2020-11-25: qty 2

## 2020-11-25 NOTE — Plan of Care (Signed)
  Problem: Health Behavior/Discharge Planning: Goal: Ability to manage health-related needs will improve Outcome: Progressing   Problem: Clinical Measurements: Goal: Diagnostic test results will improve Outcome: Progressing   Problem: Activity: Goal: Risk for activity intolerance will decrease Outcome: Progressing   Problem: Nutrition: Goal: Adequate nutrition will be maintained Outcome: Progressing   Problem: Coping: Goal: Level of anxiety will decrease Outcome: Progressing   Problem: Pain Managment: Goal: General experience of comfort will improve Outcome: Progressing   Problem: Safety: Goal: Ability to remain free from injury will improve Outcome: Progressing   Problem: Skin Integrity: Goal: Risk for impaired skin integrity will decrease Outcome: Progressing

## 2020-11-25 NOTE — Progress Notes (Signed)
  Speech Language Pathology Treatment: Dysphagia  Patient Details Name: Ryan Tyler MRN: [id number] DOB: [birth year] Today's Date: 11/25/2020 Time: 3762-8315 SLP Time Calculation (min) (ACUTE ONLY): 16 min  Assessment / Plan / Recommendation Clinical Impression  Pt was seen for dysphagia treatment. He was notably more cooperative and interactive than during prior sessions. Pt still intermittently refused additional boluses, but was more amenable to encouragement during this session. Pt tolerated dysphagia 2 solids, and nectar thick liquids vis straw (using individual and consecutive swallows) without overt s/sx of aspiration. Coughing was consistently noted with thin liquids and less consistently with puree, suggesting aspiration. Signs of aspiration were not eliminated with reduced bolus sizes of thin liquids. Mastication was mildly prolonged with regular texture solids and a delayed cough was noted once. Pt's diet will be modified to dysphagia 2 solids with nectar thick liquids. SLP will continue to follow pt for treatment.    HPI HPI: Pt is a 87 y.o. male with PMHx of HTN, HLD, EtOH use, alcoholic pancreatitis, dementia, bladder cancer s/p TURBT who presented with fever, poor oral intake-he was found to have AKI, COVID-19 infection. CXR 1/30: Mild right lung atelectatic changes. MBS 01/30/19: trace silent penetration during consecutive swallows of thin liquids and trace silent aspiration of oral residuals spilling to pyriforms post swallow. Pt would not attempt a chin tuck despite cueing. A throat clear was successful in clearing aspirate, but pt needed excessive cueing to understand SLP instruction. Quantity of aspiration was judged to be minimal and a dysphagia 2 diet with thin liquids was recommended with observance of swallowing precautions. It was noted that during that MBS, " max encouragement needed for patient participation given grumpiness, confusion and poor hearing ability." Palliative care  has been consulted for Ryan Tyler with current plan of care       Recommendations  Diet recommendations: Dysphagia 2 (fine chop);Nectar-thick liquid Liquids provided via: Cup;Straw Medication Administration: Crushed with puree Supervision: Staff to assist with self feeding;Full supervision/cueing for compensatory strategies Compensations: Slow rate;Small sips/bites;Minimize environmental distractions;Clear throat after each swallow Postural Changes and/or Swallow Maneuvers: Seated upright 90 degrees                Oral Care Recommendations: Oral care BID Follow up Recommendations:  (TBD) SLP Visit Diagnosis: Dysphagia, unspecified (R13.10) Plan: Continue with current plan of care       Alette Kataoka I. Hardin Negus, [place], [place] Office number [phone] Pager [phone]               Horton Marshall 11/25/2020, 5:39 PM

## 2020-11-25 NOTE — Progress Notes (Signed)
PROGRESS NOTE                                                                                                                                                                                                             Patient Demographics:    Ryan Tyler, is a 87 y.o. male, DOB - 1928/10/11, [id number]  Outpatient Primary MD for the patient is Josetta Huddle, MD   Admit date - 11/20/2020   LOS - 5  Chief Complaint  Patient presents with  . Fever       Brief Narrative: Patient is a 87 y.o. male with PMHx of HTN, HLD, EtOH use, alcoholic pancreatitis, dementia, bladder cancer s/p TURBT-presenting with fever, poor oral intake-he was found to have AKI, COVID-19 infection-and subsequently admitted to the hospitalist service.  Further hospital stay was complicated by delirium/alcohol withdrawal.  COVID-19 vaccinated status: Vaccinated  Significant Events: 1/30>> Admit to [facility] for AKI/COVID-19 infection  Significant studies: 1/30>>Chest x-ray: Mild right lung atelectasis 2/3>> chest x-ray: Right/left midlung infiltrates. 2/3>> bilateral lower extremity Doppler: Negative for DVT  COVID-19 medications: Steroids: 1/30>> Remdesivir:1/30>>2/3  Antibiotics: Unasyn: 2/2>>  Microbiology data: None  Procedures: None  Consults: None  DVT prophylaxis: enoxaparin (LOVENOX) injection 40 mg Start: 11/20/20 1600    Subjective:   Much more awake compared to yesterday-no longer lethargic-he is confused but able to follow simple commands.   Assessment  & Plan :   Acute metabolic encephalopathy: Multifactorial etiology- hospital delirium, alcohol withdrawal, COVID-19 likely culprits superimposed on dementia.  Seems to be slowly improving-he is much more awake but confused (not sure what his baseline is).  Suspect he will have some sort of delirium as long as he is hospitalized given history of dementia.  Continue  supportive care.    AKI:-Mild-likely hemodynamically mediated-continue gentle hydration.  Recheck electrolytes tomorrow  EtOH dependence/withdrawl: Confused-Per patient's son-drinks at least 2 bottles of beer and 2 drinks of liquor on a daily basis-family does not want to try Ativan-tolerating Librium fairly well-continue slow taper.  Pneumonia due to COVID-19 and aspiration/bacterial: Continues to remain congested-but on room air-on dysphagia 2 diet-completed Remdesivir-continue to taper steroids-remains on Unasyn.    Fever: afebrile O2 requirements:  SpO2: 97 %   COVID-19 Labs: Recent Labs    11/23/20 0303 11/24/20 0114 11/25/20 0123  DDIMER 3.51* 3.43* 2.51*  FERRITIN  47 51 44  CRP 4.1* 8.6* 11.9*    No results found for: BNP  Recent Labs  Lab 11/20/20 1146  PROCALCITON 4.68    Lab Results  Component Value Date   SARSCOV2NAA POSITIVE (A) 11/20/2020   [place] NEGATIVE 05/24/2020   SARSCOV2NAA NOT DETECTED 01/23/2019     Elevated D-dimer: Due to COVID-19 related inflammation-lower extremity Dopplers negative-on prophylactic heparin.  Follow closely.  HTN: BP stable-lisinopril/HCTZ on hold.  Resume when able.  GERD: Continue PPI  History of suspected COPD: Continue bronchodilators  History of bladder cancer  Hard of hearing  Severe deconditioning/debility-due to acute illness-PT/OT recommending SNF-however family wants him home.  Palliative care: DNR in [place] frail/debilitated-with multifactorial etiology-suspected aspiration-plans are to continue with medical care as outlined above-unfortunately if he continues to aspirate-then we might need to talk about further delineation of goals of care with family.  GI prophylaxis: PPI  ABG:    Component Value Date/Time   HCO3 22.1 01/23/2019 2015   TCO2 23 01/23/2019 2015   ACIDBASEDEF 4.0 (H) 01/23/2019 2015   O2SAT 78.0 01/23/2019 2015    Vent Settings: N/A  Condition -Guarded  Family  Communication  :  Son-Reagan [phone]-on 2/4 (extremely difficult to hold a rational conversation with the son-he goes off on multiple tangents)  Code Status : DNR  Diet :  Diet Order            DIET DYS 2 Room service appropriate? Yes with Assist; Fluid consistency: Thin  Diet effective now                  Disposition Plan  :   Status is: Inpatient  Remains inpatient appropriate because:Inpatient level of care appropriate due to severity of illness   Dispo: The patient is from: Home              Anticipated d/c is to: Home              Anticipated d/c date is: > 1-2 days              Patient currently is not medically stable to d/c.   Difficult to place patient No   Barriers to discharge: Ongoing encephalopathy-not yet at baseline.  Antimicorbials  :    Anti-infectives (From admission, onward)   Start     Dose/Rate Route Frequency Ordered Stop   11/23/20 1600  ampicillin-sulbactam (UNASYN) 1.5 g in sodium chloride 0.9 % 100 mL IVPB        1.5 g 200 mL/hr over 30 Minutes Intravenous Every 8 hours 11/23/20 1510     11/21/20 1000  remdesivir 100 mg in sodium chloride 0.9 % 100 mL IVPB       "Followed by" Linked Group Details   100 mg 200 mL/hr over 30 Minutes Intravenous Daily 11/20/20 0633 11/24/20 0959   11/20/20 0730  remdesivir 200 mg in sodium chloride 0.9% 250 mL IVPB       "Followed by" Linked Group Details   200 mg 580 mL/hr over 30 Minutes Intravenous Once 11/20/20 1610 11/20/20 0900      Inpatient Medications  Scheduled Meds: . vitamin C  500 mg Oral Daily  . benzonatate  200 mg Oral TID  . chlordiazePOXIDE  10 mg Oral BID  . docusate sodium  100 mg Oral BID  . enoxaparin (LOVENOX) injection  40 mg Subcutaneous Daily  . fluticasone furoate-vilanterol  1 puff Inhalation Daily  . folic acid  1 mg Oral Daily  .  guaiFENesin  600 mg Oral BID  . methylPREDNISolone (SOLU-MEDROL) injection  40 mg Intravenous BID  . multivitamin with minerals  1  tablet Oral Daily  . pantoprazole  40 mg Oral Daily  . sodium chloride flush  3 mL Intravenous Q12H  . sodium chloride flush  3 mL Intravenous Q12H  . spiritus frumenti  1 each Oral BID  . umeclidinium bromide  1 puff Inhalation Daily  . zinc sulfate  220 mg Oral Daily   Continuous Infusions: . sodium chloride    . ampicillin-sulbactam (UNASYN) IV 1.5 g (11/25/20 0824)   PRN Meds:.sodium chloride, acetaminophen, albuterol, bisacodyl, chlorpheniramine-HYDROcodone, haloperidol lactate, ondansetron **OR** ondansetron (ZOFRAN) IV, polyethylene glycol, sodium chloride flush, sodium phosphate   Time Spent in minutes  25  See all Orders from today for further details   Oren Binet M.D on 11/25/2020 at 12:22 PM  To page go to [email or web address] - use universal password  [facility] -  Office  [phone]    Objective:   Vitals:   11/24/20 1955 11/25/20 0017 11/25/20 0342 11/25/20 0735  BP: (!) 154/92 140/78 (!) 127/98 124/71  Pulse: 100 68 65 64  Resp: 18 20 20 20   Temp: 98.3 F (36.8 C) 98.3 F (36.8 C) 97.6 F (36.4 C) 97.8 F (36.6 C)  TempSrc: Axillary Axillary Axillary Axillary  SpO2: 95% 95% 97%   Weight:        Wt Readings from Last 3 Encounters:  11/20/20 71.8 kg  05/24/20 77.1 kg  01/31/19 78.1 kg     Intake/Output Summary (Last 24 hours) at 11/25/2020 1222 Last data filed at 11/25/2020 0900 Gross per 24 hour  Intake 628.27 ml  Output --  Net 628.27 ml     Physical Exam Gen Exam: More alert but pleasantly confused this morning HEENT:atraumatic, normocephalic Chest: B/L clear to auscultation anteriorly CVS:S1S2 regular Abdomen:soft non tender, non distended Extremities:no edema Neurology: Generalized weakness-but moving all 4 extremities. Skin: no rash   Data Review:    CBC Recent Labs  Lab 11/21/20 0150 11/22/20 0221 11/23/20 0303 11/24/20 0114 11/25/20 0123  WBC 7.1 7.0 12.5* 17.3* 11.5*  HGB 12.7* 13.8 14.7 15.4 13.5  HCT 38.9*  46.4 46.8 50.0 43.9  PLT 111* 128* 144* 167 139*  MCV 81.9 84.1 82.1 82.2 82.8  MCH 26.7 25.0* 25.8* 25.3* 25.5*  MCHC 32.6 29.7* 31.4 30.8 30.8  RDW 15.9* 15.6* 15.9* 16.6* 16.0*  LYMPHSABS 0.3* 0.2* 0.2* 0.6* 0.2*  MONOABS 0.2 0.4 0.9 1.2* 1.2*  EOSABS 0.0 0.0 0.0 0.0 0.0  BASOSABS 0.0 0.0 0.0 0.0 0.0    Chemistries  Recent Labs  Lab 11/21/20 0150 11/22/20 0221 11/23/20 0303 11/24/20 0114 11/25/20 0123  NA 137 136 136 139 141  K 4.2 3.3* 4.9 4.5 4.6  CL 104 99 101 105 106  CO2 22 24 23  21* 23  GLUCOSE 187* 150* 244* 203* 175*  BUN 43* 39* 31* 34* 35*  CREATININE 1.30* 1.23 1.07 1.29* 1.26*  CALCIUM 8.6* 8.4* 9.0 8.8* 8.4*  MG 1.6* 1.6* 1.9 1.9 1.7  AST 23 29 38 45* 30  ALT 15 18 19 24 23   ALKPHOS 92 93 89 87 72  BILITOT 0.9 1.2 1.1 1.3* 1.6*   ------------------------------------------------------------------------------------------------------------------ No results for input(s): CHOL, HDL, LDLCALC, TRIG, CHOLHDL, LDLDIRECT in the last 72 hours.  Lab Results  Component Value Date   HGBA1C 5.9 (H) 05/24/2020   ------------------------------------------------------------------------------------------------------------------ No results for input(s): TSH, T4TOTAL, T3FREE, THYROIDAB in the last 72  hours.  Invalid input(s): FREET3 ------------------------------------------------------------------------------------------------------------------ Recent Labs    11/24/20 0114 11/25/20 0123  FERRITIN 51 44    Coagulation profile No results for input(s): INR, PROTIME in the last 168 hours.  Recent Labs    11/24/20 0114 11/25/20 0123  DDIMER 3.43* 2.51*    Cardiac Enzymes No results for input(s): CKMB, TROPONINI, MYOGLOBIN in the last 168 hours.  Invalid input(s): CK ------------------------------------------------------------------------------------------------------------------ No results found for: BNP  Micro Results Recent Results (from the past 240  hour(s))  SARS Coronavirus 2 by RT PCR (hospital order, performed in [facility] hospital lab) Nasopharyngeal Nasopharyngeal Swab     Status: Abnormal   Collection Time: 11/20/20  2:59 AM   Specimen: Nasopharyngeal Swab  Result Value Ref Range Status   SARS Coronavirus 2 POSITIVE (A) NEGATIVE Final    Comment: RESULT CALLED TO, READ BACK BY AND VERIFIED WITH: Marion Downer 1031 11/20/2020 T. TYSOR (NOTE) SARS-CoV-2 target nucleic acids are DETECTED  SARS-CoV-2 RNA is generally detectable in upper respiratory specimens  during the acute phase of infection.  Positive results are indicative  of the presence of the identified virus, but do not rule out bacterial infection or co-infection with other pathogens not detected by the test.  Clinical correlation with patient history and  other diagnostic information is necessary to determine patient infection status.  The expected result is negative.  Fact Sheet for Patients:   BoilerBrush.com.cy   Fact Sheet for Healthcare Providers:   https://pope.com/    This test is not yet approved or cleared by the [place] FDA and  has been authorized for detection and/or diagnosis of SARS-CoV-2 by FDA under an Emergency Use Authorization (EUA).  This EUA will remain in effect (meaning thi s test can be used) for the duration of  the COVID-19 declaration under Section 564(b)(1) of the Act, 21 U.S.C. section 360-bbb-3(b)(1), unless the authorization is terminated or revoked sooner.  Performed at [facility], [address] [place], [place] [address]     Radiology Reports DG Chest [place] 1 View  Result Date: 11/24/2020 CLINICAL DATA:  Shortness of breath. EXAM: PORTABLE CHEST 1 VIEW COMPARISON:  11/20/2020.  05/24/2020.  01/23/2019. FINDINGS: Mediastinum and hilar structures normal. Heart size normal. Low lung volumes with bibasilar atelectasis. Pleuroparenchymal thickening right upper lung  suggesting scarring. Mild diffuse right lung and left mid lung interstitial infiltrates. No pleural effusion or pneumothorax. Surgical clips right upper abdomen. IMPRESSION: 1. Mild diffuse right lung and left mid lung interstitial infiltrates. 2. Low lung volumes with bibasilar atelectasis. Probable right upper lung pleural-parenchymal scarring. Electronically Signed   By: Maisie Fus  Register   On: 11/24/2020 06:43   DG Chest Portable 1 View  Result Date: 11/20/2020 CLINICAL DATA:  Shortness of breath and fevers EXAM: PORTABLE CHEST 1 VIEW COMPARISON:  05/24/2020 FINDINGS: Cardiac shadow is stable. Aortic calcifications are noted. Left lung is clear. Right lung demonstrates some upper lobe atelectatic changes. No bony abnormality is seen. IMPRESSION: Mild right lung atelectatic changes. Electronically Signed   By: Alcide Clever M.D.   On: 11/20/2020 03:18   VAS [place] LOWER EXTREMITY VENOUS (DVT)  Result Date: 11/24/2020  Lower Venous DVT Study Indications: Covid +ve, increasing d dimer.  Comparison Study: no prior Performing Technologist: Blanch Media RVS  Examination Guidelines: A complete evaluation includes B-mode imaging, spectral Doppler, color Doppler, and power Doppler as needed of all accessible portions of each vessel. Bilateral testing is considered an integral part of a complete examination. Limited  examinations for reoccurring indications may be performed as noted. The reflux portion of the exam is performed with the patient in reverse Trendelenburg.  +---------+---------------+---------+-----------+----------+--------------+ RIGHT    CompressibilityPhasicitySpontaneityPropertiesThrombus Aging +---------+---------------+---------+-----------+----------+--------------+ CFV      Full           Yes      Yes                                 +---------+---------------+---------+-----------+----------+--------------+ SFJ      Full                                                         +---------+---------------+---------+-----------+----------+--------------+ FV Prox  Full                                                        +---------+---------------+---------+-----------+----------+--------------+ FV Mid   Full                                                        +---------+---------------+---------+-----------+----------+--------------+ FV DistalFull                                                        +---------+---------------+---------+-----------+----------+--------------+ PFV      Full                                                        +---------+---------------+---------+-----------+----------+--------------+ POP      Full           Yes      Yes                                 +---------+---------------+---------+-----------+----------+--------------+ PTV      Full                                                        +---------+---------------+---------+-----------+----------+--------------+ PERO     Full                                                        +---------+---------------+---------+-----------+----------+--------------+   +---------+---------------+---------+-----------+----------+--------------+ LEFT     CompressibilityPhasicitySpontaneityPropertiesThrombus Aging +---------+---------------+---------+-----------+----------+--------------+ CFV      Full  Yes      Yes                                 +---------+---------------+---------+-----------+----------+--------------+ SFJ      Full                                                        +---------+---------------+---------+-----------+----------+--------------+ FV Prox  Full                                                        +---------+---------------+---------+-----------+----------+--------------+ FV Mid   Full                                                         +---------+---------------+---------+-----------+----------+--------------+ FV DistalFull                                                        +---------+---------------+---------+-----------+----------+--------------+ PFV      Full                                                        +---------+---------------+---------+-----------+----------+--------------+ POP      Full           Yes      Yes                                 +---------+---------------+---------+-----------+----------+--------------+ PTV      Full                                                        +---------+---------------+---------+-----------+----------+--------------+ PERO     Full                                                        +---------+---------------+---------+-----------+----------+--------------+     Summary: BILATERAL: - No evidence of deep vein thrombosis seen in the lower extremities, bilaterally. - No evidence of superficial venous thrombosis in the lower extremities, bilaterally. -No evidence of popliteal cyst, bilaterally.   *See table(s) above for measurements and observations. Electronically signed by Jamelle Haring on 11/24/2020 at 3:49:43 PM.    Final

## 2020-11-25 NOTE — Progress Notes (Signed)
     Referral received for Ryan Tyler :goals of care discussion. Chart reviewed and updates received. Patien is unable to engage appropriately in discussions. Attempted to contact patient's son, Ryan Tyler. Unable to reach. Voicemail left with contact information given.   PMT will re-attempt to contact family at a later time/date. Detailed note and recommendations to follow once GOC has been completed.   Thank you for your referral and allowing PMT to assist in [name] [facility] care.   Alda Lea, AGPCNP-BC Palliative Medicine Team  Phone: [phone] Pager: [phone] Amion: N. [place]   NO CHARGE

## 2020-11-25 NOTE — TOC Progression Note (Signed)
Transition of Care [facility]) - Progression Note    Patient Details  Name: Ryan Tyler MRN: [id number] Date of Birth: 1928-04-28  Transition of Care [facility] CM/SW [place], LCSW Phone Number: 11/25/2020, 9:27 AM  Clinical Narrative:    CSW spoke with patient's son to continue disposition discussion. He continues to state that patient needs familiar surroundings at his house and that is the best rehab. He states that he has already notified his job that he will be helping his dad when he is discharged. He does not feel that a SNF will be helpful and is aware of his mobility status and needs. He requests home health services. He states patient has a new walker, he requests a 3in1 bedside commode. CSW confirmed address and PCP. CSW will continue to follow.     Expected Discharge Plan: [place] Barriers to Discharge: Continued Medical Work up  Expected Discharge Plan and Services Expected Discharge Plan: [place] In-house Referral: Clinical Social Work     Living arrangements for the past 2 months: Single Family Home                                       Social Determinants of Health (SDOH) Interventions    Readmission Risk Interventions No flowsheet data found.

## 2020-11-26 DIAGNOSIS — G9349 Other encephalopathy: Secondary | ICD-10-CM | POA: Diagnosis not present

## 2020-11-26 DIAGNOSIS — G309 Alzheimer's disease, unspecified: Secondary | ICD-10-CM | POA: Diagnosis not present

## 2020-11-26 DIAGNOSIS — Z66 Do not resuscitate: Secondary | ICD-10-CM

## 2020-11-26 DIAGNOSIS — U071 COVID-19: Secondary | ICD-10-CM | POA: Diagnosis not present

## 2020-11-26 DIAGNOSIS — N179 Acute kidney failure, unspecified: Secondary | ICD-10-CM | POA: Diagnosis not present

## 2020-11-26 DIAGNOSIS — Z515 Encounter for palliative care: Secondary | ICD-10-CM

## 2020-11-26 DIAGNOSIS — N183 Chronic kidney disease, stage 3 unspecified: Secondary | ICD-10-CM | POA: Diagnosis not present

## 2020-11-26 DIAGNOSIS — Z7189 Other specified counseling: Secondary | ICD-10-CM

## 2020-11-26 DIAGNOSIS — I10 Essential (primary) hypertension: Secondary | ICD-10-CM

## 2020-11-26 LAB — CBC
HCT: 40.5 % (ref 39.0–52.0)
Hemoglobin: 13.3 g/dL (ref 13.0–17.0)
MCH: 26.4 pg (ref 26.0–34.0)
MCHC: 32.8 g/dL (ref 30.0–36.0)
MCV: 80.5 fL (ref 80.0–100.0)
Platelets: 142 10*3/uL — ABNORMAL LOW (ref 150–400)
RBC: 5.03 MIL/uL (ref 4.22–5.81)
RDW: 16.4 % — ABNORMAL HIGH (ref 11.5–15.5)
WBC: 9.2 10*3/uL (ref 4.0–10.5)
nRBC: 0 % (ref 0.0–0.2)

## 2020-11-26 LAB — D-DIMER, QUANTITATIVE: D-Dimer, Quant: 7.58 ug/mL-FEU — ABNORMAL HIGH (ref 0.00–0.50)

## 2020-11-26 LAB — COMPREHENSIVE METABOLIC PANEL
ALT: 27 U/L (ref 0–44)
AST: 34 U/L (ref 15–41)
Albumin: 2.4 g/dL — ABNORMAL LOW (ref 3.5–5.0)
Alkaline Phosphatase: 66 U/L (ref 38–126)
Anion gap: 9 (ref 5–15)
BUN: 34 mg/dL — ABNORMAL HIGH (ref 8–23)
CO2: 23 mmol/L (ref 22–32)
Calcium: 8.2 mg/dL — ABNORMAL LOW (ref 8.9–10.3)
Chloride: 105 mmol/L (ref 98–111)
Creatinine, Ser: 1.01 mg/dL (ref 0.61–1.24)
GFR, Estimated: >60 mL/min (ref >60)
Glucose, Bld: 193 mg/dL — ABNORMAL HIGH (ref 70–99)
Potassium: 4.3 mmol/L (ref 3.5–5.1)
Sodium: 137 mmol/L (ref 135–145)
Total Bilirubin: 1.6 mg/dL — ABNORMAL HIGH (ref 0.3–1.2)
Total Protein: 4.6 g/dL — ABNORMAL LOW (ref 6.5–8.1)

## 2020-11-26 LAB — C-REACTIVE PROTEIN: CRP: 3.8 mg/dL — ABNORMAL HIGH (ref <1.0)

## 2020-11-26 MED ORDER — PREDNISONE 5 MG PO TABS
30.0000 mg | ORAL_TABLET | Freq: Every day | ORAL | Status: DC
  Administered 2020-11-27: 30 mg via ORAL
  Filled 2020-11-26: qty 2

## 2020-11-26 MED ORDER — NEPRO/CARBSTEADY PO LIQD
237.0000 mL | Freq: Three times a day (TID) | ORAL | Status: DC
  Administered 2020-11-26: 237 mL via ORAL

## 2020-11-26 NOTE — Progress Notes (Signed)
PROGRESS NOTE                                                                                                                                                                                                             Patient Demographics:    Ryan Tyler, is a 87 y.o. male, DOB - 07/09/1928, KD:[id number]  Outpatient Primary MD for the patient is Josetta Huddle, MD   Admit date - 11/20/2020   LOS - 6  Chief Complaint  Patient presents with  . Fever       Brief Narrative: Patient is a 87 y.o. male with PMHx of HTN, HLD, EtOH use, alcoholic pancreatitis, dementia, bladder cancer s/p TURBT-presenting with fever, poor oral intake-he was found to have AKI, COVID-19 infection-and subsequently admitted to the hospitalist service.  Further hospital stay was complicated by delirium/alcohol withdrawal.  COVID-19 vaccinated status: Vaccinated  Significant Events: 1/30>> Admit to [facility] for AKI/COVID-19 infection  Significant studies: 1/30>>Chest x-ray: Mild right lung atelectasis 2/3>> chest x-ray: Right/left midlung infiltrates. 2/3>> bilateral lower extremity Doppler: Negative for DVT  COVID-19 medications: Steroids: 1/30>> Remdesivir:1/30>>2/3  Antibiotics: Unasyn: 2/2>>  Microbiology data: None  Procedures: None  Consults: Palliative Care  DVT prophylaxis: enoxaparin (LOVENOX) injection 40 mg Start: 11/20/20 1600    Subjective:   Much more lethargic compared to yesterday-on mittens.   Assessment  & Plan :   Acute metabolic encephalopathy: Multifactorial etiology- hospital delirium, alcohol withdrawal, COVID-19 likely culprits superimposed on dementia.  Mentation seems to wax and wane-was relatively awake and alert yesterday-but is again confused and lethargic today.  Suspect that he will continue to have some delirium as long as he is hospitalized and outside of his family surroundings.  Continue to slowly  taper down Librium-on low-dose Seroquel nightly.  AKI:-Mild-likely hemodynamically mediated-improving-follow periodically.   EtOH dependence/withdrawl: Mentation continues to wax and wane-should be in the last stages of alcohol withdrawal. Per patient's son-drinks at least 2 bottles of beer and 2 drinks of liquor on a daily basis-family does not want to try Ativan-tolerating Librium fairly well-continue slow taper.  Pneumonia due to COVID-19 and aspiration/bacterial: Continues to remain congested-but on room air-on dysphagia 2 diet-completed Remdesivir-continue to taper steroids-remains on Unasyn.    Fever: afebrile O2 requirements:  SpO2: 100 %   COVID-19 Labs: Recent Labs    11/24/20 0114 11/25/20 0123  11/26/20 0759  DDIMER 3.43* 2.51* 7.58*  FERRITIN 51 44  --   CRP 8.6* 11.9* 3.8*    No results found for: BNP  Recent Labs  Lab 11/20/20 1146  PROCALCITON 4.68    Lab Results  Component Value Date   SARSCOV2NAA POSITIVE (A) 11/20/2020   [place] NEGATIVE 05/24/2020   SARSCOV2NAA NOT DETECTED 01/23/2019     Elevated D-dimer: Due to COVID-19 related inflammation-lower extremity Dopplers negative-on prophylactic heparin.  Not hypoxic-clinical exam chest which is more consistent with aspiration-follow closely.  HTN: BP stable-lisinopril/HCTZ on hold.  Resume when able.  GERD: Continue PPI  History of suspected COPD: Continue bronchodilators  History of bladder cancer  Hard of hearing  Severe deconditioning/debility-due to acute illness-PT/OT recommending SNF-however family wants him home.  Palliative care: DNR in [place] frail/debilitated-with multifactorial etiology-suspected aspiration-unfortunately-not much of progress in spite of above-noted cares.  Palliative care now following-spoke to palliative care NP today-family aware of tenuous clinical status-no further escalation in care-they understand aspiration risk-they do not want feeding tubes.  Ultimately  plan is to get him home with hospice early next week.    GI prophylaxis: PPI  ABG:    Component Value Date/Time   HCO3 22.1 01/23/2019 2015   TCO2 23 01/23/2019 2015   ACIDBASEDEF 4.0 (H) 01/23/2019 2015   O2SAT 78.0 01/23/2019 2015    Vent Settings: N/A  Condition -Guarded  Family Communication  :  Son-Geral Monteleone-[phone]-on 2/4 (extremely difficult to hold a rational conversation with the son-he goes off on multiple tangents).  Palliative care currently engaging family-I will hold off calling son today.  Code Status : DNR  Diet :  Diet Order            DIET DYS 2 Room service appropriate? Yes with Assist; Fluid consistency: Nectar Thick  Diet effective now                  Disposition Plan  :   Status is: Inpatient  Remains inpatient appropriate because:Inpatient level of care appropriate due to severity of illness   Dispo: The patient is from: Home              Anticipated d/c is to: Home              Anticipated d/c date is: > 1-2 days              Patient currently is not medically stable to d/c.   Difficult to place patient No   Barriers to discharge: Ongoing encephalopathy-not yet at baseline.  Antimicorbials  :    Anti-infectives (From admission, onward)   Start     Dose/Rate Route Frequency Ordered Stop   11/23/20 1600  ampicillin-sulbactam (UNASYN) 1.5 g in sodium chloride 0.9 % 100 mL IVPB        1.5 g 200 mL/hr over 30 Minutes Intravenous Every 8 hours 11/23/20 1510     11/21/20 1000  remdesivir 100 mg in sodium chloride 0.9 % 100 mL IVPB       "Followed by" Linked Group Details   100 mg 200 mL/hr over 30 Minutes Intravenous Daily 11/20/20 0633 11/24/20 0959   11/20/20 0730  remdesivir 200 mg in sodium chloride 0.9% 250 mL IVPB       "Followed by" Linked Group Details   200 mg 580 mL/hr over 30 Minutes Intravenous Once 11/20/20 [id number] 11/20/20 0900      Inpatient Medications  Scheduled Meds: . vitamin C  500 mg  Oral Daily  .  benzonatate  200 mg Oral TID  . docusate sodium  100 mg Oral BID  . enoxaparin (LOVENOX) injection  40 mg Subcutaneous Daily  . feeding supplement (NEPRO CARB STEADY)  237 mL Oral TID BM  . fluticasone furoate-vilanterol  1 puff Inhalation Daily  . folic acid  1 mg Oral Daily  . guaiFENesin  600 mg Oral BID  . multivitamin with minerals  1 tablet Oral Daily  . pantoprazole  40 mg Oral Daily  . predniSONE  40 mg Oral Q breakfast  . QUEtiapine  25 mg Oral QHS  . sodium chloride flush  3 mL Intravenous Q12H  . sodium chloride flush  3 mL Intravenous Q12H  . spiritus frumenti  1 each Oral BID  . thiamine  100 mg Oral Daily  . umeclidinium bromide  1 puff Inhalation Daily  . zinc sulfate  220 mg Oral Daily   Continuous Infusions: . sodium chloride    . ampicillin-sulbactam (UNASYN) IV 1.5 g (11/26/20 0940)   PRN Meds:.sodium chloride, acetaminophen, albuterol, bisacodyl, chlorpheniramine-HYDROcodone, haloperidol lactate, ondansetron **OR** ondansetron (ZOFRAN) IV, polyethylene glycol, Resource ThickenUp Clear, sodium chloride flush, sodium phosphate   Time Spent in minutes  25  See all Orders from today for further details   Oren Binet M.D on 11/26/2020 at 2:39 PM  To page go to [email or web address] - use universal password  [facility] -  Office  [phone]    Objective:   Vitals:   11/25/20 2349 11/26/20 0323 11/26/20 0858 11/26/20 1100  BP: 130/69 119/68 116/64 128/61  Pulse: 79 70 61 60  Resp: 16 14 16 16   Temp: 98.6 F (37 C) 98.5 F (36.9 C) 98.4 F (36.9 C) 98.2 F (36.8 C)  TempSrc: Axillary Axillary Axillary Axillary  SpO2: 98% 100% 100% 100%  Weight:  85.7 kg    Height:        Wt Readings from Last 3 Encounters:  11/26/20 85.7 kg  05/24/20 77.1 kg  01/31/19 78.1 kg     Intake/Output Summary (Last 24 hours) at 11/26/2020 1439 Last data filed at 11/26/2020 [id number] Gross per 24 hour  Intake 311.84 ml  Output --  Net 311.84 ml     Physical  Exam Gen Exam: More lethargic-confused HEENT:atraumatic, normocephalic Chest: B/L clear to auscultation anteriorly CVS:S1S2 regular Abdomen:soft non tender, non distended Extremities:no edema Neurology: Non focal-but with generalized weakness. Skin: no rash   Data Review:    CBC Recent Labs  Lab 11/21/20 0150 11/22/20 0221 11/23/20 0303 11/24/20 0114 11/25/20 0123 11/26/20 0759  WBC 7.1 7.0 12.5* 17.3* 11.5* 9.2  HGB 12.7* 13.8 14.7 15.4 13.5 13.3  HCT 38.9* 46.4 46.8 50.0 43.9 40.5  PLT 111* 128* 144* 167 139* 142*  MCV 81.9 84.1 82.1 82.2 82.8 80.5  MCH 26.7 25.0* 25.8* 25.3* 25.5* 26.4  MCHC 32.6 29.7* 31.4 30.8 30.8 32.8  RDW 15.9* 15.6* 15.9* 16.6* 16.0* 16.4*  LYMPHSABS 0.3* 0.2* 0.2* 0.6* 0.2*  --   MONOABS 0.2 0.4 0.9 1.2* 1.2*  --   EOSABS 0.0 0.0 0.0 0.0 0.0  --   BASOSABS 0.0 0.0 0.0 0.0 0.0  --     Chemistries  Recent Labs  Lab 11/21/20 0150 11/22/20 0221 11/23/20 0303 11/24/20 0114 11/25/20 0123 11/26/20 0759  NA 137 136 136 139 141 137  K 4.2 3.3* 4.9 4.5 4.6 4.3  CL 104 99 101 105 106 105  CO2 22 24 23  21* 23 23  GLUCOSE  187* 150* 244* 203* 175* 193*  BUN 43* 39* 31* 34* 35* 34*  CREATININE 1.30* 1.23 1.07 1.29* 1.26* 1.01  CALCIUM 8.6* 8.4* 9.0 8.8* 8.4* 8.2*  MG 1.6* 1.6* 1.9 1.9 1.7  --   AST 23 29 38 45* 30 34  ALT 15 18 19 24 23 27   ALKPHOS 92 93 89 87 72 66  BILITOT 0.9 1.2 1.1 1.3* 1.6* 1.6*   ------------------------------------------------------------------------------------------------------------------ No results for input(s): CHOL, HDL, LDLCALC, TRIG, CHOLHDL, LDLDIRECT in the last 72 hours.  Lab Results  Component Value Date   HGBA1C 5.9 (H) 05/24/2020   ------------------------------------------------------------------------------------------------------------------ No results for input(s): TSH, T4TOTAL, T3FREE, THYROIDAB in the last 72 hours.  Invalid input(s):  FREET3 ------------------------------------------------------------------------------------------------------------------ Recent Labs    11/24/20 0114 11/25/20 0123  FERRITIN 51 44    Coagulation profile No results for input(s): INR, PROTIME in the last 168 hours.  Recent Labs    11/25/20 0123 11/26/20 0759  DDIMER 2.51* 7.58*    Cardiac Enzymes No results for input(s): CKMB, TROPONINI, MYOGLOBIN in the last 168 hours.  Invalid input(s): CK ------------------------------------------------------------------------------------------------------------------ No results found for: BNP  Micro Results Recent Results (from the past 240 hour(s))  SARS Coronavirus 2 by RT PCR (hospital order, performed in [facility] hospital lab) Nasopharyngeal Nasopharyngeal Swab     Status: Abnormal   Collection Time: 11/20/20  2:59 AM   Specimen: Nasopharyngeal Swab  Result Value Ref Range Status   SARS Coronavirus 2 POSITIVE (A) NEGATIVE Final    Comment: RESULT CALLED TO, READ BACK BY AND VERIFIED WITH: Thurman Coyer [id number] 11/20/2020 T. TYSOR (NOTE) SARS-CoV-2 target nucleic acids are DETECTED  SARS-CoV-2 RNA is generally detectable in upper respiratory specimens  during the acute phase of infection.  Positive results are indicative  of the presence of the identified virus, but do not rule out bacterial infection or co-infection with other pathogens not detected by the test.  Clinical correlation with patient history and  other diagnostic information is necessary to determine patient infection status.  The expected result is negative.  Fact Sheet for Patients:   StrictlyIdeas.no   Fact Sheet for Healthcare Providers:   BankingDealers.co.za    This test is not yet approved or cleared by the [place] FDA and  has been authorized for detection and/or diagnosis of SARS-CoV-2 by FDA under an Emergency Use Authorization (EUA).  This EUA  will remain in effect (meaning thi s test can be used) for the duration of  the COVID-19 declaration under Section 564(b)(1) of the Act, 21 U.S.C. section 360-bbb-3(b)(1), unless the authorization is terminated or revoked sooner.  Performed at [facility], [place] [address], [place], [place] [address]     Radiology Reports DG Chest [place] 1 View  Result Date: 11/24/2020 CLINICAL DATA:  Shortness of breath. EXAM: PORTABLE CHEST 1 VIEW COMPARISON:  11/20/2020.  05/24/2020.  01/23/2019. FINDINGS: Mediastinum and hilar structures normal. Heart size normal. Low lung volumes with bibasilar atelectasis. Pleuroparenchymal thickening right upper lung suggesting scarring. Mild diffuse right lung and left mid lung interstitial infiltrates. No pleural effusion or pneumothorax. Surgical clips right upper abdomen. IMPRESSION: 1. Mild diffuse right lung and left mid lung interstitial infiltrates. 2. Low lung volumes with bibasilar atelectasis. Probable right upper lung pleural-parenchymal scarring. Electronically Signed   By: Marcello Moores  Register   On: 11/24/2020 06:43   DG Chest Portable 1 View  Result Date: 11/20/2020 CLINICAL DATA:  Shortness of breath and fevers EXAM: PORTABLE CHEST 1 VIEW COMPARISON:  05/24/2020 FINDINGS: Cardiac shadow is stable. Aortic calcifications are noted. Left lung is clear. Right lung demonstrates some upper lobe atelectatic changes. No bony abnormality is seen. IMPRESSION: Mild right lung atelectatic changes. Electronically Signed   By: Inez Catalina M.D.   On: 11/20/2020 03:18   VAS [place] LOWER EXTREMITY VENOUS (DVT)  Result Date: 11/24/2020  Lower Venous DVT Study Indications: Covid +ve, increasing d dimer.  Comparison Study: no prior Performing Technologist: Abram Sander RVS  Examination Guidelines: A complete evaluation includes B-mode imaging, spectral Doppler, color Doppler, and power Doppler as needed of all accessible portions of each vessel. Bilateral testing is considered an  integral part of a complete examination. Limited examinations for reoccurring indications may be performed as noted. The reflux portion of the exam is performed with the patient in reverse Trendelenburg.  +---------+---------------+---------+-----------+----------+--------------+ RIGHT    CompressibilityPhasicitySpontaneityPropertiesThrombus Aging +---------+---------------+---------+-----------+----------+--------------+ CFV      Full           Yes      Yes                                 +---------+---------------+---------+-----------+----------+--------------+ SFJ      Full                                                        +---------+---------------+---------+-----------+----------+--------------+ FV Prox  Full                                                        +---------+---------------+---------+-----------+----------+--------------+ FV Mid   Full                                                        +---------+---------------+---------+-----------+----------+--------------+ FV DistalFull                                                        +---------+---------------+---------+-----------+----------+--------------+ PFV      Full                                                        +---------+---------------+---------+-----------+----------+--------------+ POP      Full           Yes      Yes                                 +---------+---------------+---------+-----------+----------+--------------+ PTV      Full                                                        +---------+---------------+---------+-----------+----------+--------------+  PERO     Full                                                        +---------+---------------+---------+-----------+----------+--------------+   +---------+---------------+---------+-----------+----------+--------------+ LEFT     CompressibilityPhasicitySpontaneityPropertiesThrombus  Aging +---------+---------------+---------+-----------+----------+--------------+ CFV      Full           Yes      Yes                                 +---------+---------------+---------+-----------+----------+--------------+ SFJ      Full                                                        +---------+---------------+---------+-----------+----------+--------------+ FV Prox  Full                                                        +---------+---------------+---------+-----------+----------+--------------+ FV Mid   Full                                                        +---------+---------------+---------+-----------+----------+--------------+ FV DistalFull                                                        +---------+---------------+---------+-----------+----------+--------------+ PFV      Full                                                        +---------+---------------+---------+-----------+----------+--------------+ POP      Full           Yes      Yes                                 +---------+---------------+---------+-----------+----------+--------------+ PTV      Full                                                        +---------+---------------+---------+-----------+----------+--------------+ PERO     Full                                                        +---------+---------------+---------+-----------+----------+--------------+  Summary: BILATERAL: - No evidence of deep vein thrombosis seen in the lower extremities, bilaterally. - No evidence of superficial venous thrombosis in the lower extremities, bilaterally. -No evidence of popliteal cyst, bilaterally.   *See table(s) above for measurements and observations. Electronically signed by Jamelle Haring on 11/24/2020 at 3:49:43 PM.    Final

## 2020-11-26 NOTE — Progress Notes (Signed)
Physical Therapy Treatment Patient Details Name: Ryan Tyler MRN: [id number] DOB: [birth year] Today's Date: 11/26/2020    History of Present Illness Ryan Tyler is a 87 y.o. male with medical history significant of HTN; HLD; ETOH dependence with h/o alcoholic pancreatitis; dementia; and bladder cancer s/p TURBT presenting with fever. Per triage note, EMS brought in the patient due to fever, anorexia x 1 week.  His son was concerned about bedbugs. It is not clear whether the patient has continued to drink alcohol.  Pt admitted with encephalopathy and COVID 19    PT Comments    Session limited by pt's decreased cognition and ability to engage pt in activities. He rather easily agreed to sit EOB, however once there he only wanted to return to supine. He did not appear in distress with sats >90%. Noted family is refusing SNF and plans to care for pt at home. Discharge plan and frequency updated to reflect this change.     Follow Up Recommendations  Home health PT;Other (comment) (SNF is recommended, however family planning to take pt home)     Equipment Recommendations  Rolling walker with 5" wheels    Recommendations for Other Services       Precautions / Restrictions Precautions Precautions: Fall;Other (comment) Precaution Comments: COVID+    Mobility  Bed Mobility Overal bed mobility: Needs Assistance Bed Mobility: Supine to Sit;Sit to Supine     Supine to sit: Min assist Sit to supine: Supervision   General bed mobility comments: increased time and multimodal cues  Transfers                    Ambulation/Gait                 Stairs             Wheelchair Mobility    Modified Rankin (Stroke Patients Only)       Balance Overall balance assessment: Needs assistance Sitting-balance support: No upper extremity supported Sitting balance-Leahy Scale: Fair                                      Cognition Arousal/Alertness:  Awake/alert Behavior During Therapy: Flat affect Overall Cognitive Status: No family/caregiver present to determine baseline cognitive functioning                                 General Comments: Pt does have hx of dementia and AMS - no family present to determine baseline.  Pt turned head to his name but not able to answer any questions.  Followed simple commands inconsistently with multimodal cues and repetition.  Once up just kept saying " I  want to lay down"      Exercises      General Comments General comments (skin integrity, edema, etc.): O2 sats 90+% during session      Pertinent Vitals/Pain Pain Assessment: Faces Faces Pain Scale: No hurt    Home Living                      Prior Function            PT Goals (current goals can now be found in the care plan section) Acute Rehab PT Goals Patient Stated Goal: none stated PT Goal Formulation: Patient unable to participate in goal setting  Time For Goal Achievement: 12/02/20 Progress towards PT goals: Not progressing toward goals - comment    Frequency    Min 3X/week      PT Plan Discharge plan needs to be updated;Frequency needs to be updated    Co-evaluation              AM-PAC PT "6 Clicks" Mobility   Outcome Measure  Help needed turning from your back to your side while in a flat bed without using bedrails?: A Little Help needed moving from lying on your back to sitting on the side of a flat bed without using bedrails?: A Lot Help needed moving to and from a bed to a chair (including a wheelchair)?: A Lot Help needed standing up from a chair using your arms (e.g., wheelchair or bedside chair)?: A Lot Help needed to walk in hospital room?: A Lot Help needed climbing 3-5 steps with a railing? : A Lot 6 Click Score: 13    End of Session   Activity Tolerance: Patient tolerated treatment well Patient left: in bed;with call bell/phone within reach;with bed alarm set Nurse  Communication: Mobility status PT Visit Diagnosis: Unsteadiness on feet (R26.81);Muscle weakness (generalized) (M62.81);Difficulty in walking, not elsewhere classified (R26.2)     Time: 7622-6333 PT Time Calculation (min) (ACUTE ONLY): 19 min  Charges:  $Therapeutic Activity: 8-22 mins                      [name], PT Pager [phone]    [name] 11/26/2020, 3:52 PM

## 2020-11-26 NOTE — Progress Notes (Signed)
Initial Nutrition Assessment  DOCUMENTATION CODES:   Not applicable  INTERVENTION:  Nepro Shake po BID, each supplement provides 425 kcal and 19 grams protein  Magic cup TID with meals, each supplement provides 290 kcal and 9 grams of protein  NUTRITION DIAGNOSIS:   Inadequate oral intake related to lethargy/confusion (encephalopathy due to COVID-19 virus; dementia) as evidenced by energy intake < or equal to 50% for > or equal to 5 days.   GOAL:   Patient will meet greater than or equal to 90% of their needs   MONITOR:   Labs,I & O's,Supplement acceptance,PO intake,Weight trends,Skin  REASON FOR ASSESSMENT:   Malnutrition Screening Tool    ASSESSMENT:  87 year old male with history of HTN, HLD, EtOH dependence, alcoholic pancreatitis, dementia with behavioral disturbance, HOH, CKD stage IIIand bladder cancer s/p TURBT presented with fever and poor appetite. Pt admitted with encephalopathy due to COVID-19 virus.  Admit 1/30  RD working remotely.  Hospital stay complicated by delirium/alcohol withdrawal. Noted confused, but following simple commands.   Meal intake has been very poor, eating 0-75% (21% average of the last 8 documented meals from 1/31-2/5) Coughing consistently noted with thin liquids, concerns for aspiration. SLP is closely following, diet downgraded to DYS 2 with nectar thickened liquids on 2/4. Will order Nepro and Magic Cup supplements. Plans to continue supportive care, noted discussions for further delineation of goals  with family if he continues to aspirate, palliative following.   Weight 85.7 kg on 2/5 up +13.9 kg from 71.8 kg on 1/30; suspect inaccurate bed weight.  I/Os: +2244.6 ml since admit  Last documented BM on 1/31, on scheduled bowel regimen.  Medications reviewed and include: Vit C, Colace, Folic acid, Mucinex, Prednisone, Seroquel, Thiamine, Zinc sulfate, Unasyn  Labs reviewed  NUTRITION - FOCUSED PHYSICAL EXAM:  Unable to  complete at this time  Diet Order:   Diet Order            DIET DYS 2 Room service appropriate? Yes with Assist; Fluid consistency: Nectar Thick  Diet effective now                 EDUCATION NEEDS:   Not appropriate for education at this time  Skin:  Skin Assessment: Reviewed RN Assessment  Last BM:  1/31 type 5  Height:   Ht Readings from Last 1 Encounters:  11/25/20 6' (1.829 m)    Weight:   Wt Readings from Last 1 Encounters:  11/26/20 85.7 kg    BMI:  Body mass index is 25.63 kg/m.  Estimated Nutritional Needs:   Kcal:  0488-8916  Protein:  125-140  Fluid:  2.1 L/day   Lajuan Lines, RD, LDN Clinical Nutrition After Hours/Weekend Pager # in [place]

## 2020-11-26 NOTE — Consult Note (Signed)
Consultation Note Date: 11/26/2020   Patient Name: Ryan Tyler  DOB: [birth year]  MRN: [id number]  Age / Sex: 87 y.o., male   PCP: Josetta Huddle, MD Referring Physician: Jonetta Osgood, MD   REASON FOR CONSULTATION:Establishing goals of care  Palliative Care consult requested for goals of care discussion in this 87 y.o. male with multiple medical problems including hypertension, hyperlipidemia, ETOH dependence with h/o alcoholic pancreatitis, dementia, and bladder cancer s/p TURBT. Patient presented to ER from home with concerns for fever x1 week and poor appetite. COVID positive on work-up. CIWA protocol in the setting of known ETOH dependence. GFR 38 (previous around 60).   Clinical Assessment and Goals of Care: I have reviewed medical records including lab results, imaging, Epic notes, and MAR. I spoke with patient's son, Ryan Tyler via phone  to discuss diagnosis prognosis, [place], EOL wishes, disposition and options. Received Remdesivir and steroids.   Patient remains somewhat lethargic and unable to engage in discussion.   Mr. Delaughter and his son are familiar to myself and the Palliative team from previous admissions. Son verbalizes his remembrance of our involvement. I re- introduced Palliative Medicine as specialized medical care for people living with serious illness. It focuses on providing relief from the symptoms and stress of a serious illness. The goal is to improve quality of life for both the patient and the family. Son verbalized understanding and appreciation.   Patient continues to reside in his home with family and friend support. Son Tyler he does commute back and forth to his home in [place] and here to assist with his father. He endorses being present for his father and assisting with his care needs, and patient has close friends and tenant that serves in son's absence.   Prior to admission Mr. Sikorski Tyler reports his father was hard of hearing however, he  was able to appropriately engage in discussion and express needs. Patient was ambulatory with cane or walker. Son does endorses that patient does have alcoholic beverages.   We discussed His current illness and what it means in the larger context of His on-going co-morbidities. With specific discussions regarding recent COVID infection, AKI, pneumonia, and overall of functional and nutritional state. We discussed at length Mr. Meskill's high risk for continued aspiration. Natural disease trajectory and expectations at EOL were discussed.  Mr. Crichlow Tyler verbalized understanding of his father's current illness and co-morbidities. He states he is aware of his father's aspiration risk, endorsing obvious occassions of coughing during eating or drinking at times.   I attempted to elicit values and goals of care important to the patient.    I discussed at length complications from aspiration including reoccurring pneumonia and decrease in oral nutrition. Son verbalizes understanding expressing he and his father have had direct discussions and at this point in his life patient and son's wishes are to focus on his "comfort, peace, and happiness!"    Education provided on artificial feedings in the setting of poor oral intake with recommendations against in the setting of advanced age and altered mental status. Son verbalized he is not interested in life-prolonging measures including feeding tubes.   The difference between aggressive medical intervention and comfort care was considered in light of the patient's goals of care.   Son is clear in goals of patient returning home and spending what time he has left in the home with family and friends doing whatever he chooses as long as he is comfortable and  happy! He wants his father to be able to "eat for comfort and not to live". He wishes to continue with current treatment however does not want any escalation in care with a goal of getting patient back into his  home to spend what time he has left.   Ryan Tyler his father made a promise to his wife and made him promise that he would pass away in his home if at all possible because this is where his wife/best friend passed away!  Ryan Tyler is patient's HCPOA. Confirms DNR/DNI.   Hospice and Palliative Care services outpatient were explained and offered. Patient and family verbalized their understanding and awareness of both palliative and hospice's goals and philosophy of care. Son is requesting patient returns home with outpatient hospice support.   Questions and concerns were addressed. The family was encouraged to call with questions or concerns.  PMT will continue to support holistically.   SOCIAL HISTORY:     reports that he has never smoked. He quit smokeless tobacco use about 11 years ago.  His smokeless tobacco use included chew. He reports current alcohol use. He reports that he does not use drugs.  CODE STATUS: DNR  ADVANCE DIRECTIVES: Primary Decision Maker: Ryan Tyler (son/POA)    SYMPTOM MANAGEMENT: per attending   Palliative Prophylaxis:   Aspiration, Bowel Regimen, Delirium Protocol, Eye Care, Frequent Pain Assessment, Oral Care and Turn Reposition  PSYCHO-SOCIAL/SPIRITUAL:  Support System: Family  Desire for further Chaplaincy support:No   Additional Recommendations (Limitations, Scope, Preferences):  Avoid Hospitalization, Initiate Comfort Feeding, No Artificial Feeding and continue to treat, no escalation in care, DNR   Education on hospice/palliative    PAST MEDICAL HISTORY: Past Medical History:  Diagnosis Date  . Asymptomatic cholelithiasis   . Bladder tumor   . BPH (benign prostatic hypertrophy)   . Cancer ([place])    bladder  . GERD (gastroesophageal reflux disease)   . Hearing loss   . HOH (hard of hearing)    no hearing aids  . Hyperlipidemia   . Hypertension   . Kidney stones   . Mild left inguinal hernia   . Nocturia   . Urgency of  urination     ALLERGIES:  has No Known Allergies.   MEDICATIONS:  Current Facility-Administered Medications  Medication Dose Route Frequency Provider Last Rate Last Admin  . 0.9 %  sodium chloride infusion  250 mL Intravenous PRN Karmen Bongo, MD      . acetaminophen (TYLENOL) tablet 650 mg  650 mg Oral Q6H PRN Karmen Bongo, MD      . albuterol (VENTOLIN HFA) 108 (90 Base) MCG/ACT inhaler 2 puff  2 puff Inhalation Q2H PRN Karmen Bongo, MD      . ampicillin-sulbactam (UNASYN) 1.5 g in sodium chloride 0.9 % 100 mL IVPB  1.5 g Intravenous Q8H Robertson, Crystal S, RPH 200 mL/hr at 11/26/20 0940 1.5 g at 11/26/20 0940  . ascorbic acid (VITAMIN C) tablet 500 mg  500 mg Oral Daily Karmen Bongo, MD   500 mg at 11/26/20 [id number]  . benzonatate (TESSALON) capsule 200 mg  200 mg Oral TID Jonetta Osgood, MD   200 mg at 11/26/20 [id number]  . bisacodyl (DULCOLAX) EC tablet 5 mg  5 mg Oral Daily PRN Karmen Bongo, MD      . chlorpheniramine-HYDROcodone (TUSSIONEX) 10-8 MG/5ML suspension 5 mL  5 mL Oral Q12H PRN Karmen Bongo, MD      . docusate sodium (COLACE) capsule 100 mg  100 mg Oral BID Karmen Bongo, MD   100 mg at 11/26/20 [id number]  . enoxaparin (LOVENOX) injection 40 mg  40 mg Subcutaneous Daily Karmen Bongo, MD   40 mg at 11/26/20 [id number]  . fluticasone furoate-vilanterol (BREO ELLIPTA) 100-25 MCG/INH 1 puff  1 puff Inhalation Daily Karmen Bongo, MD   1 puff at 11/26/20 0940  . folic acid (FOLVITE) tablet 1 mg  1 mg Oral Daily Karmen Bongo, MD   1 mg at 11/26/20 [id number]  . guaiFENesin (MUCINEX) 12 hr tablet 600 mg  600 mg Oral BID Jonetta Osgood, MD   600 mg at 11/26/20 0936  . haloperidol lactate (HALDOL) injection 2 mg  2 mg Intravenous Q6H PRN Jonetta Osgood, MD   2 mg at 11/26/20 0242  . multivitamin with minerals tablet 1 tablet  1 tablet Oral Daily Karmen Bongo, MD   1 tablet at 11/26/20 [phone]  . ondansetron (ZOFRAN) tablet 4 mg  4 mg Oral Q6H PRN Karmen Bongo, MD        Or  . ondansetron [facility]) injection 4 mg  4 mg Intravenous Q6H PRN Karmen Bongo, MD      . pantoprazole (PROTONIX) EC tablet 40 mg  40 mg Oral Daily Karmen Bongo, MD   40 mg at 11/26/20 0936  . polyethylene glycol (MIRALAX / GLYCOLAX) packet 17 g  17 g Oral Daily PRN Karmen Bongo, MD      . predniSONE (DELTASONE) tablet 40 mg  40 mg Oral Q breakfast Jonetta Osgood, MD   40 mg at 11/26/20 0936  . QUEtiapine (SEROQUEL) tablet 25 mg  25 mg Oral QHS Jonetta Osgood, MD   25 mg at 11/25/20 2100  . Resource ThickenUp Clear   Oral PRN Jonetta Osgood, MD      . sodium chloride flush (NS) 0.9 % injection 3 mL  3 mL Intravenous Q12H Karmen Bongo, MD   3 mL at 11/26/20 0941  . sodium chloride flush (NS) 0.9 % injection 3 mL  3 mL Intravenous Q12H Karmen Bongo, MD   3 mL at 11/26/20 0941  . sodium chloride flush (NS) 0.9 % injection 3 mL  3 mL Intravenous PRN Karmen Bongo, MD      . sodium phosphate (FLEET) 7-19 GM/118ML enema 1 enema  1 enema Rectal Once PRN Karmen Bongo, MD      . spiritus frumenti (ethyl alcohol) solution 1 each  1 each Oral BID Jonetta Osgood, MD   1 each at 11/25/20 2100  . thiamine tablet 100 mg  100 mg Oral Daily Jonetta Osgood, MD   100 mg at 11/26/20 [id number]  . umeclidinium bromide (INCRUSE ELLIPTA) 62.5 MCG/INH 1 puff  1 puff Inhalation Daily Karmen Bongo, MD   1 puff at 11/26/20 0940  . zinc sulfate capsule 220 mg  220 mg Oral Daily Karmen Bongo, MD   220 mg at 11/26/20 0936    VITAL SIGNS: BP 128/61 (BP Location: Left Leg)   Pulse 60   Temp 98.2 F (36.8 C) (Axillary)   Resp 16   Ht 6' (1.829 m)   Wt 85.7 kg   SpO2 100%   BMI 25.63 kg/m  [facility]   11/20/20 2036 11/26/20 0323  Weight: 71.8 kg 85.7 kg    Estimated body mass index is 25.63 kg/m as calculated from the following:   Height as of this encounter: 6' (1.829 m).   Weight as of this encounter: 85.7 kg.  LABS: CBC:    Component Value Date/Time   WBC  9.2 11/26/2020 0759   HGB 13.3 11/26/2020 0759   HCT 40.5 11/26/2020 0759   PLT 142 (L) 11/26/2020 0759   Comprehensive Metabolic Panel:    Component Value Date/Time   NA 137 11/26/2020 0759   K 4.3 11/26/2020 0759   BUN 34 (H) 11/26/2020 0759   CREATININE 1.01 11/26/2020 0759   ALBUMIN 2.4 (L) 11/26/2020 0759     Review of Systems  Unable to perform ROS: Acuity of condition   The above conversation was completed via telephone due to the visitor restrictions during the COVID-19 pandemic. Thorough chart review and discussion with necessary members of the care team was completed as part of assessment. All issues were discussed and addressed but no physical exam was performed.  Prognosis: < 6 months or less in the setting of COVID-19, pneumonia, lethargy, dementia, CKD, pneumonia, ETOH use, aspiration, advanced age, deconditioned.   Discharge Planning:  Home with Hospice as requested by son.   Recommendations: . DNR/DNI-as confirmed by son . Continue with current plan of care per medical team. No escalation in care.  . Discussed/educated son at length regarding high risk for aspiration. Son is not interested in artificial feedings such as PEG. He wishes to allow his father comfort feedings with awareness of risk.  . Son is clear in expressed wishes for patient to discharge home with family and friends support. Goal is for patient to be comfortable, happy, and at peace during what time he has left.  Ryan Tyler is requesting outpatient hospice support and PTAR transport home. (TOC referral placed)  . At discharge patient will need RX for comfort meds (Roxanol and Ativan).  Marland Kitchen PMT will continue to support and follow. Please call team line with urgent needs.   Palliative Performance Scale: PPS 20-30%                Family expressed understanding and was in agreement with this plan.   Thank you for allowing the Palliative Medicine Team to assist in the care of this  patient.  Time In: 1300 Time Out: 1350 Time Total: 50 min.   Visit consisted of counseling and education dealing with the complex and emotionally intense issues of symptom management and palliative care in the setting of serious and potentially life-threatening illness.Greater than 50%  of this time was spent counseling and coordinating care related to the above assessment and plan.  Signed by:  Alda Lea, AGPCNP-BC Palliative Medicine Team  Phone: [phone] Pager: [phone] Amion: Bjorn Pippin

## 2020-11-26 NOTE — Progress Notes (Signed)
Pharmacy Antibiotic Note  Ryan Tyler is a 87 y.o. male admitted on 11/20/2020 with aspiration PNA.  Pharmacy has been consulted for Unasyn dosing.   Patient continues on day 4 of Unasyn for possible aspiration pneumonia. WBC is wnl at 9.2 and patient remains afebrile. Scr stable at 1.01.   Plan: Continue Unasyn 1.5g IV q8hr F/u LOT - likely 5 days adequate   Height: 6' (182.9 cm) Weight: 85.7 kg (189 lb) IBW/kg (Calculated) : 77.6  Temp (24hrs), Avg:98.5 F (36.9 C), Min:98.3 F (36.8 C), Max:98.6 F (37 C)  Recent Labs  Lab 11/22/20 0221 11/23/20 0303 11/24/20 0114 11/25/20 0123 11/26/20 0759  WBC 7.0 12.5* 17.3* 11.5* 9.2  CREATININE 1.23 1.07 1.29* 1.26* 1.01    Estimated Creatinine Clearance: 52.3 mL/min (by C-G formula based on SCr of 1.01 mg/dL).    No Known Allergies  Claudina Lick, PharmD PGY1 Acute Care Pharmacy Resident 11/26/2020 11:27 AM  Please check [email or web address] for unit-specific pharmacy phone numbers.

## 2020-11-27 DIAGNOSIS — N179 Acute kidney failure, unspecified: Secondary | ICD-10-CM | POA: Diagnosis not present

## 2020-11-27 DIAGNOSIS — G9349 Other encephalopathy: Secondary | ICD-10-CM | POA: Diagnosis not present

## 2020-11-27 DIAGNOSIS — U071 COVID-19: Secondary | ICD-10-CM | POA: Diagnosis not present

## 2020-11-27 DIAGNOSIS — G309 Alzheimer's disease, unspecified: Secondary | ICD-10-CM | POA: Diagnosis not present

## 2020-11-27 LAB — CBC
HCT: 45.1 % (ref 39.0–52.0)
Hemoglobin: 14.4 g/dL (ref 13.0–17.0)
MCH: 26.1 pg (ref 26.0–34.0)
MCHC: 31.9 g/dL (ref 30.0–36.0)
MCV: 81.7 fL (ref 80.0–100.0)
Platelets: 164 10*3/uL (ref 150–400)
RBC: 5.52 MIL/uL (ref 4.22–5.81)
RDW: 16.5 % — ABNORMAL HIGH (ref 11.5–15.5)
WBC: 12.1 10*3/uL — ABNORMAL HIGH (ref 4.0–10.5)
nRBC: 0 % (ref 0.0–0.2)

## 2020-11-27 LAB — COMPREHENSIVE METABOLIC PANEL
ALT: 32 U/L (ref 0–44)
AST: 33 U/L (ref 15–41)
Albumin: 2.6 g/dL — ABNORMAL LOW (ref 3.5–5.0)
Alkaline Phosphatase: 71 U/L (ref 38–126)
Anion gap: 9 (ref 5–15)
BUN: 31 mg/dL — ABNORMAL HIGH (ref 8–23)
CO2: 26 mmol/L (ref 22–32)
Calcium: 8.4 mg/dL — ABNORMAL LOW (ref 8.9–10.3)
Chloride: 104 mmol/L (ref 98–111)
Creatinine, Ser: 1.05 mg/dL (ref 0.61–1.24)
GFR, Estimated: >60 mL/min (ref >60)
Glucose, Bld: 170 mg/dL — ABNORMAL HIGH (ref 70–99)
Potassium: 4.1 mmol/L (ref 3.5–5.1)
Sodium: 139 mmol/L (ref 135–145)
Total Bilirubin: 1.5 mg/dL — ABNORMAL HIGH (ref 0.3–1.2)
Total Protein: 5.1 g/dL — ABNORMAL LOW (ref 6.5–8.1)

## 2020-11-27 LAB — D-DIMER, QUANTITATIVE: D-Dimer, Quant: 1.36 ug/mL-FEU — ABNORMAL HIGH (ref 0.00–0.50)

## 2020-11-27 LAB — C-REACTIVE PROTEIN: CRP: 2.3 mg/dL — ABNORMAL HIGH (ref <1.0)

## 2020-11-27 NOTE — TOC Progression Note (Addendum)
Transition of Care [facility]) - Progression Note    Patient Details  Name: ZAN ORLICK MRN: [id number] Date of Birth: 05/09/1928  Transition of Care [facility] CM/SW Contact  Carles Collet, RN Phone Number: 11/27/2020, 3:47 PM  Clinical Narrative:   Requested to speak w patient's son to discuss home hospice arrangements. Spoke w son over the phone. Conversation was short, and ended with me having to hang up on him due to threats, insults, and sexually inappropriate speech.  Introduced self, role, and purpose of call to assist in setting up home hospice services, to obtain choice of home hospice provider and to initiate referral. He quickly began yelling at me that he is "not a millennial, and doesn't Google this stuff." I offered to assist and choose provider based on location and ratings, however he continued to yell at me and could not focus on the conversation.  He was angry that I was not a doctor, I explained my role and purpose for calling again to reorient him to the goal of the conversation. He stated that he had my name and my number and was recording everything and began making vague threats of retaliation citing bad care his mother received at [facility] before she died. He again diverted the conversation to a different subject prohibiting me from making any progress with consult. During his rant, he referred to me as "cookie." I attempted to reorient him again, informed him that "cookie" was not my name and he said with a sexual undertone "isn't that your nickname?"  Phone call was terminated at this time. MILEY, LINDON   [phone]    This raises concerns for the son's ability to follow a care plan with home hospice as he would be responsible for the 24/7 supervision and care of the patient at home. [place] would also have to agree to provide services in a home infested with bed bugs.  TOC will continue to follow.       Expected Discharge Plan: [place] Barriers to Discharge: Continued Medical Work up  Expected Discharge Plan and Services Expected Discharge Plan: [place] In-house Referral: Clinical Social Work     Living arrangements for the past 2 months: Single Family Home                                       Social Determinants of Health (SDOH) Interventions    Readmission Risk Interventions No flowsheet data found.

## 2020-11-27 NOTE — Progress Notes (Signed)
The son, Laverna Peace requests an update this morning at [phone].

## 2020-11-27 NOTE — Progress Notes (Signed)
PROGRESS NOTE                                                                                                                                                                                                             Patient Demographics:    Ryan Tyler, is a 87 y.o. male, DOB - 03-01-1928, KD:[id number]  Outpatient Primary MD for the patient is Josetta Huddle, MD   Admit date - 11/20/2020   LOS - 7  Chief Complaint  Patient presents with  . Fever       Brief Narrative: Patient is a 87 y.o. male with PMHx of HTN, HLD, EtOH use, alcoholic pancreatitis, dementia, bladder cancer s/p TURBT-presenting with fever, poor oral intake-he was found to have AKI, COVID-19 infection-and subsequently admitted to the hospitalist service.  Further hospital stay was complicated by delirium/alcohol withdrawal.  COVID-19 vaccinated status: Vaccinated  Significant Events: 1/30>> Admit to [facility] for AKI/COVID-19 infection  Significant studies: 1/30>>Chest x-ray: Mild right lung atelectasis 2/3>> chest x-ray: Right/left midlung infiltrates. 2/3>> bilateral lower extremity Doppler: Negative for DVT  COVID-19 medications: Steroids: 1/30>> Remdesivir:1/30>>2/3  Antibiotics: Unasyn: 2/2>>  Microbiology data: None  Procedures: None  Consults: Palliative Care  DVT prophylaxis: enoxaparin (LOVENOX) injection 40 mg Start: 11/20/20 1600    Subjective:   Remains lethargic-Per nursing staff-continues to have cough-and features suggestive of continued aspiration. Seen by palliative care yesterday-no further escalation in care-Home in a few days with hospice care.    Assessment  & Plan :   Acute metabolic encephalopathy: Multifactorial etiology- hospital delirium, alcohol withdrawal, COVID-19 likely culprits superimposed on dementia.  Mentation has waxed and waned-but for the past 2 days-has been persistently lethargic and confused.  Completed course of Librium-on low-dose Seroquel. Given advanced age/frailty and probable underlying dementia-suspect he will continue to be confused as long as he is hospitalized.   AKI:-Mild-likely hemodynamically mediated-improving-follow periodically.   EtOH dependence/withdrawl: Mentation continues to wax and wane-should be in the last stages of alcohol withdrawal. Per patient's son-drinks at least 2 bottles of beer and 2 drinks of liquor on a daily basis-family does not want to try Ativan-tolerating Librium fairly well-continue slow taper.  Pneumonia due to COVID-19 and aspiration/bacterial: Continues to sound very congested-coughing-Per nursing staff-likely aspirating-family has discussed with palliative care-okay for comfort feeds-family accepts aspiration risk. On dysphagia two diet. Continue to taper  steroids-on Unasyn. Per palliative care-no further escalation in care-keep treatment as is until Monday/Tuesday-when family plans to take him home with hospice  Fever: afebrile O2 requirements:  SpO2: 97 %   COVID-19 Labs: Recent Labs    11/25/20 0123 11/26/20 0759 11/27/20 0402  DDIMER 2.51* 7.58* 1.36*  FERRITIN 44  --   --   CRP 11.9* 3.8* 2.3*    No results found for: BNP  No results for input(s): PROCALCITON in the last 168 hours.  Lab Results  Component Value Date   SARSCOV2NAA POSITIVE (A) 11/20/2020   [place] NEGATIVE 05/24/2020   SARSCOV2NAA NOT DETECTED 01/23/2019     Elevated D-dimer: Due to COVID-19 related inflammation-lower extremity Dopplers negative-on prophylactic heparin.  Not hypoxic-clinical exam chest which is more consistent with aspiration-follow closely.  HTN: BP stable-lisinopril/HCTZ on hold.  Resume when able.  GERD: Continue PPI  History of suspected COPD: Continue bronchodilators  History of bladder cancer  Hard of hearing  Severe deconditioning/debility-due to acute illness-PT/OT recommending SNF-however family wants him  home.  Palliative care: DNR in [place] frail/debilitated-with multifactorial etiology-suspected aspiration-unfortunately-not much of progress in spite of above-noted cares.  Palliative care now following-spoke to palliative care NP on 2/5--family aware of tenuous clinical status-no further escalation in care-they understand aspiration risk-they do not want feeding tubes. Plans are to get him home Monday/Tuesday with hospice care.  GI prophylaxis: PPI  ABG:    Component Value Date/Time   HCO3 22.1 01/23/2019 2015   TCO2 23 01/23/2019 2015   ACIDBASEDEF 4.0 (H) 01/23/2019 2015   O2SAT 78.0 01/23/2019 2015    Vent Settings: N/A  Condition -Guarded  Family Communication  :  Son-Dahir Pat-[phone]-on 2/6 -hard to communicate-claims he has not slept in 36 hours-claims he is "woozy"-but understands that his father is deteriorating-and wants him home with hospice.  Will touch base with case management.  Code Status : DNR  Diet :  Diet Order            DIET DYS 2 Room service appropriate? Yes with Assist; Fluid consistency: Nectar Thick  Diet effective now                  Disposition Plan  :   Status is: Inpatient  Remains inpatient appropriate because:Inpatient level of care appropriate due to severity of illness   Dispo: The patient is from: Home              Anticipated d/c is to: Home              Anticipated d/c date is: > 1-2 days              Patient currently is not medically stable to d/c.   Difficult to place patient No   Barriers to discharge: Ongoing encephalopathy-not yet at baseline.  Antimicorbials  :    Anti-infectives (From admission, onward)   Start     Dose/Rate Route Frequency Ordered Stop   11/23/20 1600  ampicillin-sulbactam (UNASYN) 1.5 g in sodium chloride 0.9 % 100 mL IVPB        1.5 g 200 mL/hr over 30 Minutes Intravenous Every 8 hours 11/23/20 1510     11/21/20 1000  remdesivir 100 mg in sodium chloride 0.9 % 100 mL IVPB        "Followed by" Linked Group Details   100 mg 200 mL/hr over 30 Minutes Intravenous Daily 11/20/20 0633 11/24/20 0959   11/20/20 0730  remdesivir 200 mg in sodium chloride  0.9% 250 mL IVPB       "Followed by" Linked Group Details   200 mg 580 mL/hr over 30 Minutes Intravenous Once 11/20/20 [id number] 11/20/20 0900      Inpatient Medications  Scheduled Meds: . vitamin C  500 mg Oral Daily  . benzonatate  200 mg Oral TID  . docusate sodium  100 mg Oral BID  . enoxaparin (LOVENOX) injection  40 mg Subcutaneous Daily  . feeding supplement (NEPRO CARB STEADY)  237 mL Oral TID BM  . fluticasone furoate-vilanterol  1 puff Inhalation Daily  . folic acid  1 mg Oral Daily  . guaiFENesin  600 mg Oral BID  . multivitamin with minerals  1 tablet Oral Daily  . pantoprazole  40 mg Oral Daily  . predniSONE  30 mg Oral Q breakfast  . QUEtiapine  25 mg Oral QHS  . sodium chloride flush  3 mL Intravenous Q12H  . sodium chloride flush  3 mL Intravenous Q12H  . spiritus frumenti  1 each Oral BID  . thiamine  100 mg Oral Daily  . umeclidinium bromide  1 puff Inhalation Daily  . zinc sulfate  220 mg Oral Daily   Continuous Infusions: . sodium chloride    . ampicillin-sulbactam (UNASYN) IV 1.5 g (11/27/20 0838)   PRN Meds:.sodium chloride, acetaminophen, albuterol, bisacodyl, chlorpheniramine-HYDROcodone, haloperidol lactate, ondansetron **OR** ondansetron (ZOFRAN) IV, polyethylene glycol, Resource ThickenUp Clear, sodium chloride flush, sodium phosphate   Time Spent in minutes  25  See all Orders from today for further details   Oren Binet M.D on 11/27/2020 at 3:00 PM  To page go to [email or web address] - use universal password  [facility] -  Office  [phone]    Objective:   Vitals:   11/26/20 2351 11/27/20 0351 11/27/20 0738 11/27/20 1300  BP: 128/84 134/85 126/82 115/78  Pulse: 80 68 71 69  Resp: 18 18 16 18   Temp: (!) 97.5 F (36.4 C) 97.9 F (36.6 C) 97.8 F (36.6 C) 97.9  F (36.6 C)  TempSrc: Axillary Axillary Axillary Axillary  SpO2: 97% 91% 97%   Weight:      Height:        Wt Readings from Last 3 Encounters:  11/26/20 85.7 kg  05/24/20 77.1 kg  01/31/19 78.1 kg     Intake/Output Summary (Last 24 hours) at 11/27/2020 1500 Last data filed at 11/27/2020 0558 Gross per 24 hour  Intake 351.59 ml  Output --  Net 351.59 ml     Physical Exam Gen Exam: Confused-but not in distress.  Has mittens. HEENT:atraumatic, normocephalic Chest: Transmitted upper airway sounds-raspy cough. CVS:S1S2 regular Abdomen:soft non tender, non distended Extremities:no edema Neurology: Moving all 4 extremities. Skin: no rash   Data Review:    CBC Recent Labs  Lab 11/21/20 0150 11/22/20 0221 11/23/20 0303 11/24/20 0114 11/25/20 0123 11/26/20 0759 11/27/20 0402  WBC 7.1 7.0 12.5* 17.3* 11.5* 9.2 12.1*  HGB 12.7* 13.8 14.7 15.4 13.5 13.3 14.4  HCT 38.9* 46.4 46.8 50.0 43.9 40.5 45.1  PLT 111* 128* 144* 167 139* 142* 164  MCV 81.9 84.1 82.1 82.2 82.8 80.5 81.7  MCH 26.7 25.0* 25.8* 25.3* 25.5* 26.4 26.1  MCHC 32.6 29.7* 31.4 30.8 30.8 32.8 31.9  RDW 15.9* 15.6* 15.9* 16.6* 16.0* 16.4* 16.5*  LYMPHSABS 0.3* 0.2* 0.2* 0.6* 0.2*  --   --   MONOABS 0.2 0.4 0.9 1.2* 1.2*  --   --   EOSABS 0.0 0.0 0.0 0.0 0.0  --   --  BASOSABS 0.0 0.0 0.0 0.0 0.0  --   --     Chemistries  Recent Labs  Lab 11/21/20 0150 11/22/20 0221 11/23/20 0303 11/24/20 0114 11/25/20 0123 11/26/20 0759 11/27/20 0402  NA 137 136 136 139 141 137 139  K 4.2 3.3* 4.9 4.5 4.6 4.3 4.1  CL 104 99 101 105 106 105 104  CO2 22 24 23  21* 23 23 26   GLUCOSE 187* 150* 244* 203* 175* 193* 170*  BUN 43* 39* 31* 34* 35* 34* 31*  CREATININE 1.30* 1.23 1.07 1.29* 1.26* 1.01 1.05  CALCIUM 8.6* 8.4* 9.0 8.8* 8.4* 8.2* 8.4*  MG 1.6* 1.6* 1.9 1.9 1.7  --   --   AST 23 29 38 45* 30 34 33  ALT 15 18 19 24 23 27  32  ALKPHOS 92 93 89 87 72 66 71  BILITOT 0.9 1.2 1.1 1.3* 1.6* 1.6* 1.5*    ------------------------------------------------------------------------------------------------------------------ No results for input(s): CHOL, HDL, LDLCALC, TRIG, CHOLHDL, LDLDIRECT in the last 72 hours.  Lab Results  Component Value Date   HGBA1C 5.9 (H) 05/24/2020   ------------------------------------------------------------------------------------------------------------------ No results for input(s): TSH, T4TOTAL, T3FREE, THYROIDAB in the last 72 hours.  Invalid input(s): FREET3 ------------------------------------------------------------------------------------------------------------------ Recent Labs    11/25/20 0123  FERRITIN 44    Coagulation profile No results for input(s): INR, PROTIME in the last 168 hours.  Recent Labs    11/26/20 0759 11/27/20 0402  DDIMER 7.58* 1.36*    Cardiac Enzymes No results for input(s): CKMB, TROPONINI, MYOGLOBIN in the last 168 hours.  Invalid input(s): CK ------------------------------------------------------------------------------------------------------------------ No results found for: BNP  Micro Results Recent Results (from the past 240 hour(s))  SARS Coronavirus 2 by RT PCR (hospital order, performed in [facility] hospital lab) Nasopharyngeal Nasopharyngeal Swab     Status: Abnormal   Collection Time: 11/20/20  2:59 AM   Specimen: Nasopharyngeal Swab  Result Value Ref Range Status   SARS Coronavirus 2 POSITIVE (A) NEGATIVE Final    Comment: RESULT CALLED TO, READ BACK BY AND VERIFIED WITH: Thurman Coyer [id number] 11/20/2020 T. TYSOR (NOTE) SARS-CoV-2 target nucleic acids are DETECTED  SARS-CoV-2 RNA is generally detectable in upper respiratory specimens  during the acute phase of infection.  Positive results are indicative  of the presence of the identified virus, but do not rule out bacterial infection or co-infection with other pathogens not detected by the test.  Clinical correlation with patient history and   other diagnostic information is necessary to determine patient infection status.  The expected result is negative.  Fact Sheet for Patients:   StrictlyIdeas.no   Fact Sheet for Healthcare Providers:   BankingDealers.co.za    This test is not yet approved or cleared by the [place] FDA and  has been authorized for detection and/or diagnosis of SARS-CoV-2 by FDA under an Emergency Use Authorization (EUA).  This EUA will remain in effect (meaning thi s test can be used) for the duration of  the COVID-19 declaration under Section 564(b)(1) of the Act, 21 U.S.C. section 360-bbb-3(b)(1), unless the authorization is terminated or revoked sooner.  Performed at [facility], [place] [address], [place], [place] [address]     Radiology Reports DG Chest [place] 1 View  Result Date: 11/24/2020 CLINICAL DATA:  Shortness of breath. EXAM: PORTABLE CHEST 1 VIEW COMPARISON:  11/20/2020.  05/24/2020.  01/23/2019. FINDINGS: Mediastinum and hilar structures normal. Heart size normal. Low lung volumes with bibasilar atelectasis. Pleuroparenchymal thickening right upper lung suggesting scarring. Mild diffuse  right lung and left mid lung interstitial infiltrates. No pleural effusion or pneumothorax. Surgical clips right upper abdomen. IMPRESSION: 1. Mild diffuse right lung and left mid lung interstitial infiltrates. 2. Low lung volumes with bibasilar atelectasis. Probable right upper lung pleural-parenchymal scarring. Electronically Signed   By: Marcello Moores  Register   On: 11/24/2020 06:43   DG Chest Portable 1 View  Result Date: 11/20/2020 CLINICAL DATA:  Shortness of breath and fevers EXAM: PORTABLE CHEST 1 VIEW COMPARISON:  05/24/2020 FINDINGS: Cardiac shadow is stable. Aortic calcifications are noted. Left lung is clear. Right lung demonstrates some upper lobe atelectatic changes. No bony abnormality is seen. IMPRESSION: Mild right lung atelectatic changes.  Electronically Signed   By: Inez Catalina M.D.   On: 11/20/2020 03:18   VAS [place] LOWER EXTREMITY VENOUS (DVT)  Result Date: 11/24/2020  Lower Venous DVT Study Indications: Covid +ve, increasing d dimer.  Comparison Study: no prior Performing Technologist: Abram Sander RVS  Examination Guidelines: A complete evaluation includes B-mode imaging, spectral Doppler, color Doppler, and power Doppler as needed of all accessible portions of each vessel. Bilateral testing is considered an integral part of a complete examination. Limited examinations for reoccurring indications may be performed as noted. The reflux portion of the exam is performed with the patient in reverse Trendelenburg.  +---------+---------------+---------+-----------+----------+--------------+ RIGHT    CompressibilityPhasicitySpontaneityPropertiesThrombus Aging +---------+---------------+---------+-----------+----------+--------------+ CFV      Full           Yes      Yes                                 +---------+---------------+---------+-----------+----------+--------------+ SFJ      Full                                                        +---------+---------------+---------+-----------+----------+--------------+ FV Prox  Full                                                        +---------+---------------+---------+-----------+----------+--------------+ FV Mid   Full                                                        +---------+---------------+---------+-----------+----------+--------------+ FV DistalFull                                                        +---------+---------------+---------+-----------+----------+--------------+ PFV      Full                                                        +---------+---------------+---------+-----------+----------+--------------+ POP  Full           Yes      Yes                                  +---------+---------------+---------+-----------+----------+--------------+ PTV      Full                                                        +---------+---------------+---------+-----------+----------+--------------+ PERO     Full                                                        +---------+---------------+---------+-----------+----------+--------------+   +---------+---------------+---------+-----------+----------+--------------+ LEFT     CompressibilityPhasicitySpontaneityPropertiesThrombus Aging +---------+---------------+---------+-----------+----------+--------------+ CFV      Full           Yes      Yes                                 +---------+---------------+---------+-----------+----------+--------------+ SFJ      Full                                                        +---------+---------------+---------+-----------+----------+--------------+ FV Prox  Full                                                        +---------+---------------+---------+-----------+----------+--------------+ FV Mid   Full                                                        +---------+---------------+---------+-----------+----------+--------------+ FV DistalFull                                                        +---------+---------------+---------+-----------+----------+--------------+ PFV      Full                                                        +---------+---------------+---------+-----------+----------+--------------+ POP      Full           Yes      Yes                                 +---------+---------------+---------+-----------+----------+--------------+  PTV      Full                                                        +---------+---------------+---------+-----------+----------+--------------+ PERO     Full                                                         +---------+---------------+---------+-----------+----------+--------------+     Summary: BILATERAL: - No evidence of deep vein thrombosis seen in the lower extremities, bilaterally. - No evidence of superficial venous thrombosis in the lower extremities, bilaterally. -No evidence of popliteal cyst, bilaterally.   *See table(s) above for measurements and observations. Electronically signed by Jamelle Haring on 11/24/2020 at 3:49:43 PM.    Final

## 2020-11-28 DIAGNOSIS — G309 Alzheimer's disease, unspecified: Secondary | ICD-10-CM | POA: Diagnosis not present

## 2020-11-28 DIAGNOSIS — U071 COVID-19: Secondary | ICD-10-CM | POA: Diagnosis not present

## 2020-11-28 DIAGNOSIS — N179 Acute kidney failure, unspecified: Secondary | ICD-10-CM | POA: Diagnosis not present

## 2020-11-28 DIAGNOSIS — G9349 Other encephalopathy: Secondary | ICD-10-CM | POA: Diagnosis not present

## 2020-11-28 LAB — COMPREHENSIVE METABOLIC PANEL
ALT: 27 U/L (ref 0–44)
AST: 31 U/L (ref 15–41)
Albumin: 2.4 g/dL — ABNORMAL LOW (ref 3.5–5.0)
Alkaline Phosphatase: 66 U/L (ref 38–126)
Anion gap: 10 (ref 5–15)
BUN: 30 mg/dL — ABNORMAL HIGH (ref 8–23)
CO2: 23 mmol/L (ref 22–32)
Calcium: 8.5 mg/dL — ABNORMAL LOW (ref 8.9–10.3)
Chloride: 106 mmol/L (ref 98–111)
Creatinine, Ser: 0.93 mg/dL (ref 0.61–1.24)
GFR, Estimated: >60 mL/min (ref >60)
Glucose, Bld: 141 mg/dL — ABNORMAL HIGH (ref 70–99)
Potassium: 4.4 mmol/L (ref 3.5–5.1)
Sodium: 139 mmol/L (ref 135–145)
Total Bilirubin: 1.2 mg/dL (ref 0.3–1.2)
Total Protein: 4.8 g/dL — ABNORMAL LOW (ref 6.5–8.1)

## 2020-11-28 LAB — CBC
HCT: 47.7 % (ref 39.0–52.0)
Hemoglobin: 14.5 g/dL (ref 13.0–17.0)
MCH: 25 pg — ABNORMAL LOW (ref 26.0–34.0)
MCHC: 30.4 g/dL (ref 30.0–36.0)
MCV: 82.4 fL (ref 80.0–100.0)
Platelets: 153 10*3/uL (ref 150–400)
RBC: 5.79 MIL/uL (ref 4.22–5.81)
RDW: 17 % — ABNORMAL HIGH (ref 11.5–15.5)
WBC: 11.5 10*3/uL — ABNORMAL HIGH (ref 4.0–10.5)
nRBC: 0 % (ref 0.0–0.2)

## 2020-11-28 LAB — GLUCOSE, CAPILLARY: Glucose-Capillary: 146 mg/dL — ABNORMAL HIGH (ref 70–99)

## 2020-11-28 LAB — C-REACTIVE PROTEIN: CRP: 2 mg/dL — ABNORMAL HIGH (ref <1.0)

## 2020-11-28 LAB — D-DIMER, QUANTITATIVE: D-Dimer, Quant: 1.45 ug/mL-FEU — ABNORMAL HIGH (ref 0.00–0.50)

## 2020-11-28 MED ORDER — PREDNISONE 20 MG PO TABS
20.0000 mg | ORAL_TABLET | Freq: Every day | ORAL | Status: DC
  Administered 2020-11-29: 20 mg via ORAL
  Filled 2020-11-28: qty 1

## 2020-11-28 MED ORDER — CHLORDIAZEPOXIDE HCL 5 MG PO CAPS: 15.0000 mg | ORAL_CAPSULE | Freq: Three times a day (TID) | ORAL | Status: DC | PRN

## 2020-11-28 MED ORDER — HALOPERIDOL LACTATE 5 MG/ML IJ SOLN
4.0000 mg | Freq: Four times a day (QID) | INTRAMUSCULAR | Status: DC | PRN
  Administered 2020-11-29 – 2020-12-05 (×3): 4 mg via INTRAVENOUS
  Filled 2020-11-28 (×4): qty 1

## 2020-11-28 NOTE — Progress Notes (Addendum)
SLP Cancellation Note  Patient Details Name: Ryan Tyler MRN: [id number] DOB: 09-15-1928   Cancelled treatment:       Reason Eval/Treat Not Completed: Fatigue/lethargy limiting ability to participate (Pt was approached for treatment, but was inadequately alert to participate. Pt's case discussed with RN who indicated that the pt has not officially been made comfort care as yet and is now symptomatic across consistencies. SLP will f/u)  Tobie Poet I. Hardin Negus, [place], [place] Office number [phone] Pager [place] 11/28/2020, 5:55 PM

## 2020-11-28 NOTE — Progress Notes (Signed)
Pt Son Ryan Tyler request an update from doctor in the AM at [phone].

## 2020-11-28 NOTE — Care Management (Addendum)
11:43 am - Case manager spoke with Clementeen Hoof with [facility] concerning referral. Melissa had conversation with patient;'s son Laverna Peace that was not productive. There are concerns about this being a safe discharge for patient. CM did inform her that Education officer, museum and I have discussed APS report as well. At this time [facility] will not be servicing patient. TOC Team will continue to monitor for disposition.   Ricki Miller, RN BSN Case Manager

## 2020-11-28 NOTE — Progress Notes (Addendum)
PROGRESS NOTE                                                                                                                                                                                                             Patient Demographics:    Ryan Tyler, is a 87 y.o. male, DOB - 01-16-1928, WRU:[id number]  Outpatient Primary MD for the patient is Ryan Huddle, MD   Admit date - 11/20/2020   LOS - 8  Chief Complaint  Patient presents with  . Fever       Brief Narrative: Patient is a 87 y.o. male with PMHx of HTN, HLD, EtOH use, alcoholic pancreatitis, dementia, bladder cancer s/p TURBT-presenting with fever, poor oral intake-he was found to have AKI, COVID-19 infection-and subsequently admitted to the hospitalist service.  Further hospital stay was complicated by delirium/alcohol withdrawal.  COVID-19 vaccinated status: Vaccinated  Significant Events: 1/30>> Admit to [facility] for AKI/COVID-19 infection  Significant studies: 1/30>>Chest x-ray: Mild right lung atelectasis 2/3>> chest x-ray: Right/left midlung infiltrates. 2/3>> bilateral lower extremity Doppler: Negative for DVT  COVID-19 medications: Steroids: 1/30>> Remdesivir:1/30>>2/3  Antibiotics: Unasyn: 2/2>>  Microbiology data: None  Procedures: None  Consults: Palliative Care  DVT prophylaxis: enoxaparin (LOVENOX) injection 40 mg Start: 11/20/20 1600    Subjective:   Remains essentially unchanged-lethargic but awakes-pushes me away-tells me to "get away".  Less congested compared to yesterday.    Assessment  & Plan :   Acute metabolic encephalopathy: Multifactorial etiology- hospital delirium, alcohol withdrawal, COVID-19 likely culprits superimposed on dementia.  Mentation has waxed and waned-but has been more lethargic and confused for the past few days.  Has completed a course of Librium for alcohol withdrawal symptoms-do not think he has  any further alcohol withdrawal symptoms-but probably has delirium/encephalopathy due to pneumonia.  Continue low-dose Seroquel-continue supportive care-suspect he will continue to be delirious/encephalopathic as long as he is hospitalized.  AKI:-Mild-likely hemodynamically mediated-resolved with supportive care.  EtOH dependence/withdrawl: Per son-patient drinks at least 2 bottles of beer and 2 drinks of liquor on a daily basis-has completed a course of Librium taper.  Should be out of the window for alcohol withdrawal symptoms.  Mentation still confused-but I suspect this is mostly from dementia/delirium and encephalopathy due to pneumonia.   Pneumonia due to COVID-19 and aspiration/bacterial: On Unasyn-evaluated by palliative care given continued risk  for severe aspiration-as he is very confused and encephalopathic and very debilitated.  Recommendations are to continue with current care-social worker/case management following and working with family to see if we can have home hospice arranged.  Per palliative care-no further escalation in care.  Fever: afebrile O2 requirements:  SpO2: 95 %   COVID-19 Labs: Recent Labs    11/26/20 0759 11/27/20 0402 11/28/20 0403  DDIMER 7.58* 1.36* 1.45*  CRP 3.8* 2.3* 2.0*    No results found for: BNP  No results for input(s): PROCALCITON in the last 168 hours.  Lab Results  Component Value Date   SARSCOV2NAA POSITIVE (A) 11/20/2020   [place] NEGATIVE 05/24/2020   SARSCOV2NAA NOT DETECTED 01/23/2019     Elevated D-dimer: Due to COVID-19 related inflammation-lower extremity Dopplers negative-on prophylactic heparin.  Not hypoxic-clinical exam chest which is more consistent with aspiration-follow closely.  HTN: BP stable-lisinopril/HCTZ on hold.  Resume when able.  GERD: Continue PPI  History of suspected COPD: Continue bronchodilators  History of bladder cancer  Hard of hearing  Severe deconditioning/debility-due to acute  illness-PT/OT recommending SNF-however family wants him home.  Palliative care: DNR in [place] frail/debilitated-with multifactorial etiology-suspected aspiration-unfortunately-not much of progress in spite of above-noted cares.  Palliative care now following-spoke to palliative care NP on 2/5--family aware of tenuous clinical status-no further escalation in care-they understand aspiration risk-they do not want feeding tubes.  Plans were to get him home with hospice care-however given issues-see above-this may not be possible.  Social work/case manager/palliative care following-May need to look at residential hospice instead.  GI prophylaxis: PPI  ABG:    Component Value Date/Time   HCO3 22.1 01/23/2019 2015   TCO2 23 01/23/2019 2015   ACIDBASEDEF 4.0 (H) 01/23/2019 2015   O2SAT 78.0 01/23/2019 2015    Vent Settings: N/A  Condition -Guarded  Family Communication  :  Son-[name]-[phone]-on 2/7- continuous to be very hard to communicate with-goes off on tangents/rants.  He has been very abusive with nursing staff-and with case management as well.  I spoke to him at length this morning-explained that to discharge his father home with hospice-we will need his cooperation-and to refrain from being abusive to our staff.    Code Status : DNR  Diet :  Diet Order            DIET DYS 2 Room service appropriate? Yes with Assist; Fluid consistency: Nectar Thick  Diet effective now                  Disposition Plan  :   Status is: Inpatient  Remains inpatient appropriate because:Inpatient level of care appropriate due to severity of illness   Dispo: The patient is from: Home              Anticipated d/c is to: Home              Anticipated d/c date is: > 1-2 days              Patient currently is not medically stable to d/c.   Difficult to place patient No   Barriers to discharge: Ongoing encephalopathy-not yet at baseline.  Antimicorbials  :    Anti-infectives (From  admission, onward)   Start     Dose/Rate Route Frequency Ordered Stop   11/23/20 1600  ampicillin-sulbactam (UNASYN) 1.5 g in sodium chloride 0.9 % 100 mL IVPB        1.5 g 200 mL/hr over 30 Minutes Intravenous Every 8  hours 11/23/20 1510 11/28/20 2359   11/21/20 1000  remdesivir 100 mg in sodium chloride 0.9 % 100 mL IVPB       "Followed by" Linked Group Details   100 mg 200 mL/hr over 30 Minutes Intravenous Daily 11/20/20 0633 11/24/20 0959   11/20/20 0730  remdesivir 200 mg in sodium chloride 0.9% 250 mL IVPB       "Followed by" Linked Group Details   200 mg 580 mL/hr over 30 Minutes Intravenous Once 11/20/20 5621 11/20/20 0900      Inpatient Medications  Scheduled Meds: . vitamin C  500 mg Oral Daily  . benzonatate  200 mg Oral TID  . docusate sodium  100 mg Oral BID  . enoxaparin (LOVENOX) injection  40 mg Subcutaneous Daily  . feeding supplement (NEPRO CARB STEADY)  237 mL Oral TID BM  . fluticasone furoate-vilanterol  1 puff Inhalation Daily  . folic acid  1 mg Oral Daily  . guaiFENesin  600 mg Oral BID  . multivitamin with minerals  1 tablet Oral Daily  . pantoprazole  40 mg Oral Daily  . predniSONE  30 mg Oral Q breakfast  . QUEtiapine  25 mg Oral QHS  . sodium chloride flush  3 mL Intravenous Q12H  . sodium chloride flush  3 mL Intravenous Q12H  . spiritus frumenti  1 each Oral BID  . thiamine  100 mg Oral Daily  . umeclidinium bromide  1 puff Inhalation Daily  . zinc sulfate  220 mg Oral Daily   Continuous Infusions: . sodium chloride    . ampicillin-sulbactam (UNASYN) IV 1.5 g (11/28/20 1043)   PRN Meds:.sodium chloride, acetaminophen, albuterol, bisacodyl, chlorpheniramine-HYDROcodone, haloperidol lactate, ondansetron **OR** ondansetron (ZOFRAN) IV, polyethylene glycol, Resource ThickenUp Clear, sodium chloride flush, sodium phosphate   Time Spent in minutes  25  See all Orders from today for further details   Oren Binet M.D on 11/28/2020 at 11:43  AM  To page go to [email or web address] - use universal password  [facility] -  Office  [phone]    Objective:   Vitals:   11/27/20 2015 11/27/20 2356 11/28/20 0352 11/28/20 0726  BP: 140/87 137/88 (!) 144/68 133/64  Pulse: 87 98 88 78  Resp: 19 20 20 18   Temp: 97.9 F (36.6 C) 97.6 F (36.4 C) 98 F (36.7 C) 97.6 F (36.4 C)  TempSrc: Axillary Axillary Axillary Axillary  SpO2: 97% 98% 94% 95%  Weight:      Height:        Wt Readings from Last 3 Encounters:  11/26/20 85.7 kg  05/24/20 77.1 kg  01/31/19 78.1 kg     Intake/Output Summary (Last 24 hours) at 11/28/2020 1143 Last data filed at 11/28/2020 0300 Gross per 24 hour  Intake 269.75 ml  Output --  Net 269.75 ml     Physical Exam Gen Exam: Lethargic but awakes-pushes me away.  Follow simple commands. HEENT:atraumatic, normocephalic Chest: Transmitted upper airway sounds CVS:S1S2 regular Abdomen:soft non tender, non distended Extremities:no edema Neurology: Generalized weakness-but nonfocal.   Skin: no rash   Data Review:    CBC Recent Labs  Lab 11/22/20 0221 11/23/20 0303 11/24/20 0114 11/25/20 0123 11/26/20 0759 11/27/20 0402 11/28/20 0403  WBC 7.0 12.5* 17.3* 11.5* 9.2 12.1* 11.5*  HGB 13.8 14.7 15.4 13.5 13.3 14.4 14.5  HCT 46.4 46.8 50.0 43.9 40.5 45.1 47.7  PLT 128* 144* 167 139* 142* 164 153  MCV 84.1 82.1 82.2 82.8 80.5 81.7 82.4  MCH 25.0*  25.8* 25.3* 25.5* 26.4 26.1 25.0*  MCHC 29.7* 31.4 30.8 30.8 32.8 31.9 30.4  RDW 15.6* 15.9* 16.6* 16.0* 16.4* 16.5* 17.0*  LYMPHSABS 0.2* 0.2* 0.6* 0.2*  --   --   --   MONOABS 0.4 0.9 1.2* 1.2*  --   --   --   EOSABS 0.0 0.0 0.0 0.0  --   --   --   BASOSABS 0.0 0.0 0.0 0.0  --   --   --     Chemistries  Recent Labs  Lab 11/22/20 0221 11/23/20 0303 11/24/20 0114 11/25/20 0123 11/26/20 0759 11/27/20 0402 11/28/20 0403  NA 136 136 139 141 137 139 139  K 3.3* 4.9 4.5 4.6 4.3 4.1 4.4  CL 99 101 105 106 105 104 106  CO2 24 23 21* 23  23 26 23   GLUCOSE 150* 244* 203* 175* 193* 170* 141*  BUN 39* 31* 34* 35* 34* 31* 30*  CREATININE 1.23 1.07 1.29* 1.26* 1.01 1.05 0.93  CALCIUM 8.4* 9.0 8.8* 8.4* 8.2* 8.4* 8.5*  MG 1.6* 1.9 1.9 1.7  --   --   --   AST 29 38 45* 30 34 33 31  ALT 18 19 24 23 27  32 27  ALKPHOS 93 89 87 72 66 71 66  BILITOT 1.2 1.1 1.3* 1.6* 1.6* 1.5* 1.2   ------------------------------------------------------------------------------------------------------------------ No results for input(s): CHOL, HDL, LDLCALC, TRIG, CHOLHDL, LDLDIRECT in the last 72 hours.  Lab Results  Component Value Date   HGBA1C 5.9 (H) 05/24/2020   ------------------------------------------------------------------------------------------------------------------ No results for input(s): TSH, T4TOTAL, T3FREE, THYROIDAB in the last 72 hours.  Invalid input(s): FREET3 ------------------------------------------------------------------------------------------------------------------ No results for input(s): VITAMINB12, FOLATE, FERRITIN, TIBC, IRON, RETICCTPCT in the last 72 hours.  Coagulation profile No results for input(s): INR, PROTIME in the last 168 hours.  Recent Labs    11/27/20 0402 11/28/20 0403  DDIMER 1.36* 1.45*    Cardiac Enzymes No results for input(s): CKMB, TROPONINI, MYOGLOBIN in the last 168 hours.  Invalid input(s): CK ------------------------------------------------------------------------------------------------------------------ No results found for: BNP  Micro Results Recent Results (from the past 240 hour(s))  SARS Coronavirus 2 by RT PCR (hospital order, performed in [facility] hospital lab) Nasopharyngeal Nasopharyngeal Swab     Status: Abnormal   Collection Time: 11/20/20  2:59 AM   Specimen: Nasopharyngeal Swab  Result Value Ref Range Status   SARS Coronavirus 2 POSITIVE (A) NEGATIVE Final    Comment: RESULT CALLED TO, READ BACK BY AND VERIFIED WITH: Thurman Coyer IN:[id number] 11/20/2020 T.  TYSOR (NOTE) SARS-CoV-2 target nucleic acids are DETECTED  SARS-CoV-2 RNA is generally detectable in upper respiratory specimens  during the acute phase of infection.  Positive results are indicative  of the presence of the identified virus, but do not rule out bacterial infection or co-infection with other pathogens not detected by the test.  Clinical correlation with patient history and  other diagnostic information is necessary to determine patient infection status.  The expected result is negative.  Fact Sheet for Patients:   StrictlyIdeas.no   Fact Sheet for Healthcare Providers:   BankingDealers.co.za    This test is not yet approved or cleared by the [place] FDA and  has been authorized for detection and/or diagnosis of SARS-CoV-2 by FDA under an Emergency Use Authorization (EUA).  This EUA will remain in effect (meaning thi s test can be used) for the duration of  the COVID-19 declaration under Section 564(b)(1) of the Act, 21 U.S.C. section 360-bbb-3(b)(1), unless the  authorization is terminated or revoked sooner.  Performed at [facility], [place] [address], [place], [place] [address]     Radiology Reports DG Chest [place] 1 View  Result Date: 11/24/2020 CLINICAL DATA:  Shortness of breath. EXAM: PORTABLE CHEST 1 VIEW COMPARISON:  11/20/2020.  05/24/2020.  01/23/2019. FINDINGS: Mediastinum and hilar structures normal. Heart size normal. Low lung volumes with bibasilar atelectasis. Pleuroparenchymal thickening right upper lung suggesting scarring. Mild diffuse right lung and left mid lung interstitial infiltrates. No pleural effusion or pneumothorax. Surgical clips right upper abdomen. IMPRESSION: 1. Mild diffuse right lung and left mid lung interstitial infiltrates. 2. Low lung volumes with bibasilar atelectasis. Probable right upper lung pleural-parenchymal scarring. Electronically Signed   By: Marcello Moores  Register   On:  11/24/2020 06:43   DG Chest Portable 1 View  Result Date: 11/20/2020 CLINICAL DATA:  Shortness of breath and fevers EXAM: PORTABLE CHEST 1 VIEW COMPARISON:  05/24/2020 FINDINGS: Cardiac shadow is stable. Aortic calcifications are noted. Left lung is clear. Right lung demonstrates some upper lobe atelectatic changes. No bony abnormality is seen. IMPRESSION: Mild right lung atelectatic changes. Electronically Signed   By: Inez Catalina M.D.   On: 11/20/2020 03:18   VAS [place] LOWER EXTREMITY VENOUS (DVT)  Result Date: 11/24/2020  Lower Venous DVT Study Indications: Covid +ve, increasing d dimer.  Comparison Study: no prior Performing Technologist: Abram Sander RVS  Examination Guidelines: A complete evaluation includes B-mode imaging, spectral Doppler, color Doppler, and power Doppler as needed of all accessible portions of each vessel. Bilateral testing is considered an integral part of a complete examination. Limited examinations for reoccurring indications may be performed as noted. The reflux portion of the exam is performed with the patient in reverse Trendelenburg.  +---------+---------------+---------+-----------+----------+--------------+ RIGHT    CompressibilityPhasicitySpontaneityPropertiesThrombus Aging +---------+---------------+---------+-----------+----------+--------------+ CFV      Full           Yes      Yes                                 +---------+---------------+---------+-----------+----------+--------------+ SFJ      Full                                                        +---------+---------------+---------+-----------+----------+--------------+ FV Prox  Full                                                        +---------+---------------+---------+-----------+----------+--------------+ FV Mid   Full                                                        +---------+---------------+---------+-----------+----------+--------------+ FV DistalFull                                                         +---------+---------------+---------+-----------+----------+--------------+  PFV      Full                                                        +---------+---------------+---------+-----------+----------+--------------+ POP      Full           Yes      Yes                                 +---------+---------------+---------+-----------+----------+--------------+ PTV      Full                                                        +---------+---------------+---------+-----------+----------+--------------+ PERO     Full                                                        +---------+---------------+---------+-----------+----------+--------------+   +---------+---------------+---------+-----------+----------+--------------+ LEFT     CompressibilityPhasicitySpontaneityPropertiesThrombus Aging +---------+---------------+---------+-----------+----------+--------------+ CFV      Full           Yes      Yes                                 +---------+---------------+---------+-----------+----------+--------------+ SFJ      Full                                                        +---------+---------------+---------+-----------+----------+--------------+ FV Prox  Full                                                        +---------+---------------+---------+-----------+----------+--------------+ FV Mid   Full                                                        +---------+---------------+---------+-----------+----------+--------------+ FV DistalFull                                                        +---------+---------------+---------+-----------+----------+--------------+ PFV      Full                                                        +---------+---------------+---------+-----------+----------+--------------+  POP      Full           Yes      Yes                                  +---------+---------------+---------+-----------+----------+--------------+ PTV      Full                                                        +---------+---------------+---------+-----------+----------+--------------+ PERO     Full                                                        +---------+---------------+---------+-----------+----------+--------------+     Summary: BILATERAL: - No evidence of deep vein thrombosis seen in the lower extremities, bilaterally. - No evidence of superficial venous thrombosis in the lower extremities, bilaterally. -No evidence of popliteal cyst, bilaterally.   *See table(s) above for measurements and observations. Electronically signed by Jamelle Haring on 11/24/2020 at 3:49:43 PM.    Final

## 2020-11-28 NOTE — Progress Notes (Signed)
[facility])  [name] was referred to [facility] from hospital [facility] for hospice services at home once discharged.   Spoke with son Ryan Tyler over the phone to explain services and team approach care. Ryan Tyler was appropriate with conversation however he had slurred speech, making jokes, had put me on speaker phone with a friend who was also slurring speech. I was told not to ask questions. Then Ryan Tyler stated "God bless Ryan Tyler", I tried to initiate what type of in home equipment was needed and he stated they had bed but may need a defibrillator or oxygen. I stated that pt was on RA so would not need oxygen at this time. He then went on to say he yelled at the Dr. yesterday about his dads treatment and would like to make a mends. Stated that his father does not need any Ativan or pain medication, to let him be how he is, hes a man and a Ryan seal so he can deal with it. I asked Ryan Tyler if he drove, and he stated yes. He also said he takes care of his dad at home because hes 91.   At this time, I feel this is an unsafe discharge. CSW is aware and I will notify MD.   Please feel free to contact [place] with any questions.  Thank you,  Clementeen Hoof, BSN, [facility] [phone]

## 2020-11-28 NOTE — TOC Progression Note (Addendum)
Transition of Care [facility]) - Progression Note    Patient Details  Name: Ryan Tyler MRN: [id number] Date of Birth: 01-Jul-1928  Transition of Care [facility]) CM/SW [place], LCSW Phone Number: 11/28/2020, 12:49 PM  Clinical Narrative:    12pm-CSW left voicemail for APS Intake line regarding concerns about to returning home with his son.   1pm-CSW received return call from APS and completed report.    3pm-CSW spoke with APS, Shara Blazing [phone]). He reported that it does not sound like patient should discharge home with his son. CSW explained that patient does not yet meet residential hospice criteria and SNF will not accept patient under Medicare due to not being able to do rehab. Patient does not have Medicaid. [place] will call patient's son and follow up with CSW.      Expected Discharge Plan: Home w Hospice Care Barriers to Discharge: Continued Medical Work up  Expected Discharge Plan and Services Expected Discharge Plan: [place] In-house Referral: Clinical Social Work Discharge Planning Services: CM Consult Post Acute Care Choice: Hospice Living arrangements for the past 2 months: [place]                 DME Arranged: 3-N-1           Rachel Date [place]: 11/28/20 Time Echo: 1109 Representative spoke with at [place]: Velta Addison with [facility]   Social Determinants of Health ([place]) Interventions    Readmission Risk Interventions No flowsheet data found.

## 2020-11-28 NOTE — Progress Notes (Signed)
Occupational Therapy Treatment Note  Pt seen as cotreat with PT to increase participation with mobility. Used +2 HHA with Mod A to ambulate to window to look outside then to sink. Once at sink, pt asking to drink his beer. Assisted back to recliner. Pt set up with tray and assisted with self feeding to increase PO intake. At one time, pt trying to scoop out butter to eat rather than placing it on his food. Note increased coughing with self feeding, especially when drinking thickened liquids - will notify Speech. Nsg made aware. After lunch pt trying to get out of cahir. Pt assisted back to chair to reduce risk of falls. Given home situation as noted in chart, recommend SNF. Will continue to follow acutely.    11/28/20 1500  OT Visit Information  Last OT Received On 11/28/20  Assistance Needed +2 (for mobility)  PT/OT/SLP Co-Evaluation/Treatment Yes  Reason for Co-Treatment Necessary to address cognition/behavior during functional activity;To address functional/ADL transfers;For patient/therapist safety  OT goals addressed during session ADL's and self-care;Strengthening/ROM  History of Present Illness Ryan Tyler is a 87 y.o. male with medical history significant of HTN; HLD; ETOH dependence with h/o alcoholic pancreatitis; dementia; and bladder cancer s/p TURBT presenting with fever. Per triage note, EMS brought in the patient due to fever, anorexia x 1 week.  His son was concerned about bedbugs. It is not clear whether the patient has continued to drink alcohol.  Pt admitted with encephalopathy and COVID 19  Precautions  Precautions Fall;Other (comment)  Pain Assessment  Pain Assessment Faces  Faces Pain Scale 2  Pain Location generalized  Pain Descriptors / Indicators Grimacing  Pain Intervention(s) Limited activity within patient's tolerance  Cognition  Arousal/Alertness Awake/alert  Behavior During Therapy Flat affect  Overall Cognitive Status No family/caregiver present to determine  baseline cognitive functioning  Area of Impairment Orientation;Attention;Memory;Following commands;Safety/judgement;Awareness;Problem solving  Orientation Level Place;Time;Situation  Current Attention Level Sustained  Memory Decreased short-term memory  Following Commands Follows one step commands with increased time  Safety/Judgement Decreased awareness of safety;Decreased awareness of deficits  Awareness Intellectual  Problem Solving Slow processing;Decreased initiation  General Comments decreased insight/awarenss/safety;delayed processing; oriented to self only  Upper Extremity Assessment  Upper Extremity Assessment Generalized weakness  Lower Extremity Assessment  Lower Extremity Assessment Defer to PT evaluation  ADL  Overall ADL's  Needs assistance/impaired  Eating/Feeding Supervision/ safety;Set up  Eating/Feeding Details (indicate cue type and reason) coughing with thickened liquids  Grooming Minimal assistance;Sitting  Upper Body Bathing Moderate assistance;Sitting  Lower Body Bathing Maximal assistance;Sit to/from stand  Upper Body Dressing  Maximal assistance;Sitting  Lower Body Dressing Maximal assistance;Sit to/from Retail buyer Moderate assistance;+2 for physical assistance  Functional mobility during ADLs Moderate assistance;+2 for physical assistance;Cueing for safety  Bed Mobility  Overal bed mobility Needs Assistance  Bed Mobility Supine to Sit;Sit to Supine  Supine to sit Mod assist  Sit to supine Mod assist  General bed mobility comments multimodal cues and assist to initiate  Balance  Overall balance assessment Needs assistance  Sitting-balance support No upper extremity supported  Sitting balance-Leahy Scale Fair  Standing balance support Bilateral upper extremity supported;During functional activity  Standing balance-Leahy Scale Poor  Standing balance comment Requiring RW and min-mod A  Transfers  Overall transfer level Needs assistance   Equipment used 2 person hand held assist  Transfers Sit to/from Stand  Sit to Stand Mod assist;+2 physical assistance;+2 safety/equipment  General transfer comment Mod A to rise with multimodal cues  General Comments  General comments (skin integrity, edema, etc.) unsafely trys to sit at times withou a chair behind him; asking to go back to bed immediatly after finishing lunch  OT - End of Session  Equipment Utilized During Treatment Gait belt (HHA +2)  Activity Tolerance Patient tolerated treatment well  Patient left in bed;with call bell/phone within reach;with bed alarm set  Nurse Communication Mobility status  OT Assessment/Plan  OT Plan Discharge plan needs to be updated  OT Visit Diagnosis Unsteadiness on feet (R26.81);Muscle weakness (generalized) (M62.81)  OT Frequency (ACUTE ONLY) Min 2X/week  Follow Up Recommendations SNF  OT Equipment 3 in 1 bedside commode  AM-PAC OT "6 Clicks" Daily Activity Outcome Measure (Version 2)  Help from another person eating meals? 3  Help from another person taking care of personal grooming? 3  Help from another person toileting, which includes using toliet, bedpan, or urinal? 2  Help from another person bathing (including washing, rinsing, drying)? 2  Help from another person to put on and taking off regular upper body clothing? 2  Help from another person to put on and taking off regular lower body clothing? 2  6 Click Score 14  OT Goal Progression  Progress towards OT goals Not progressing toward goals - comment;OT to reassess next treatment  Acute Rehab OT Goals  Patient Stated Goal none stated  OT Goal Formulation Patient unable to participate in goal setting  Time For Goal Achievement 12/05/20  Potential to Achieve Goals Fair  ADL Goals  Pt Will Perform Lower Body Dressing with supervision;sit to/from stand;sitting/lateral leans  Pt Will Transfer to Toilet with supervision;ambulating;regular height toilet  Pt Will Perform Toileting  - Clothing Manipulation and hygiene with supervision;sitting/lateral leans;sit to/from stand  Pt/caregiver will Perform Home Exercise Program Increased strength;Both right and left upper extremity  Additional ADL Goal #1 Pt will increase to sit increase standing tolerance x5 mins for OOB ADL to increase independence.  OT Time Calculation  OT Start Time (ACUTE ONLY) 1259  OT Stop Time (ACUTE ONLY) 1341  OT Time Calculation (min) 42 min  OT General Charges  $OT Visit 1 Visit  OT Treatments  $Self Care/Home Management  23-37 mins  Maurie Boettcher, OT/L   Acute OT Clinical Specialist [place] Pager [phone] Office [phone]

## 2020-11-28 NOTE — Progress Notes (Signed)
Physical Therapy Treatment Patient Details Name: Ryan Tyler MRN: [id number] DOB: 1927-12-19 Today's Date: 11/28/2020    History of Present Illness Ryan Tyler is a 87 y.o. male with medical history significant of HTN; HLD; ETOH dependence with h/o alcoholic pancreatitis; dementia; and bladder cancer s/p TURBT presenting with fever. Per triage note, EMS brought in the patient due to fever, anorexia x 1 week.  His son was concerned about bedbugs. It is not clear whether the patient has continued to drink alcohol.  Pt admitted with encephalopathy and COVID 19    PT Comments    Patient remains with flat affect and requires max verbal and moderate tactile cues to participate in therapy. With +2 assist, pt able to walk up to 15 ft with +2 HHA and very forward flexed posture. Noted Case Management and home Hospice notes re: ?safe discharge plan (for son to care for pt). Discharge plan updated to SNF.     Follow Up Recommendations  Other (comment);SNF (family wants to take pt home, however SW involved due to unsafe home situation)     Equipment Recommendations  Rolling walker with 5" wheels    Recommendations for Other Services       Precautions / Restrictions Precautions Precautions: Fall;Other (comment) Precaution Comments: COVID+    Mobility  Bed Mobility Overal bed mobility: Needs Assistance Bed Mobility: Supine to Sit     Supine to sit: Mod assist     General bed mobility comments: multimodal cues and assist to initiate  Transfers Overall transfer level: Needs assistance Equipment used: 2 person hand held assist Transfers: Sit to/from Stand Sit to Stand: Mod assist;+2 physical assistance;+2 safety/equipment         General transfer comment: Mod A to rise with multimodal cues  Ambulation/Gait Ambulation/Gait assistance: Mod assist;+2 physical assistance Gait Distance (Feet): 15 Feet (standing rest; 12 ft) Assistive device: 2 person hand held assist Gait  Pattern/deviations: Step-to pattern;Decreased stride length;Trunk flexed;Shuffle Gait velocity: decr   General Gait Details: Mod A for stability and directions   Stairs             Wheelchair Mobility    Modified Rankin (Stroke Patients Only)       Balance Overall balance assessment: Needs assistance Sitting-balance support: No upper extremity supported Sitting balance-Leahy Scale: Fair     Standing balance support: Bilateral upper extremity supported;During functional activity Standing balance-Leahy Scale: Poor Standing balance comment: Requiring RW and min-mod A                            Cognition Arousal/Alertness: Awake/alert Behavior During Therapy: Flat affect Overall Cognitive Status: No family/caregiver present to determine baseline cognitive functioning                                 General Comments: decreased insight/awarenss/safety;      Exercises      General Comments General comments (skin integrity, edema, etc.): Easily lead to complete activities, however no initiation of movement noted      Pertinent Vitals/Pain Pain Assessment: Faces Faces Pain Scale: Hurts a little bit Pain Location: generalized Pain Descriptors / Indicators: Grimacing Pain Intervention(s): Limited activity within patient's tolerance;Monitored during session    Home Living Family/patient expects to be discharged to:: Private residence Living Arrangements: Alone Available Help at Discharge: Family;Available PRN/intermittently Type of Home: House Home Access: Stairs to enter Entrance  Stairs-Rails: Right;Left Home Layout: One level Home Equipment: Walker - 2 wheels Additional Comments: Pt unable to provide PLOF or home situation and family not present.  Information taken from prior admission. Pt reporting he uses RW at home.    Prior Function Level of Independence: Needs assistance  Gait / Transfers Assistance Needed: Pt utilizes RW for  mobility   Comments: Patient unable to provide history   PT Goals (current goals can now be found in the care plan section) Acute Rehab PT Goals Patient Stated Goal: none stated PT Goal Formulation: Patient unable to participate in goal setting Time For Goal Achievement: 12/02/20 Progress towards PT goals: Progressing toward goals    Frequency    Min 2X/week      PT Plan Discharge plan needs to be updated;Frequency needs to be updated    Co-evaluation              AM-PAC PT "6 Clicks" Mobility   Outcome Measure  Help needed turning from your back to your side while in a flat bed without using bedrails?: A Little Help needed moving from lying on your back to sitting on the side of a flat bed without using bedrails?: A Lot Help needed moving to and from a bed to a chair (including a wheelchair)?: A Lot Help needed standing up from a chair using your arms (e.g., wheelchair or bedside chair)?: A Lot Help needed to walk in hospital room?: A Lot Help needed climbing 3-5 steps with a railing? : A Lot 6 Click Score: 13    End of Session Equipment Utilized During Treatment: Gait belt Activity Tolerance: Patient tolerated treatment well Patient left: with call bell/phone within reach;in chair;with chair alarm set;Other (comment) (OT present to work on eating lunch) Nurse Communication: Mobility status PT Visit Diagnosis: Unsteadiness on feet (R26.81);Muscle weakness (generalized) (M62.81);Difficulty in walking, not elsewhere classified (R26.2)     Time: 4540-9811 PT Time Calculation (min) (ACUTE ONLY): 30 min  Charges:  $Gait Training: 8-22 mins                      [name], PT Pager [phone]    Ryan Tyler 11/28/2020, 2:47 PM
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
## 2020-11-28 NOTE — TOC Progression Note (Signed)
Transition of Care [facility]) - Progression Note    Patient Details  Name: Ryan Tyler MRN: [id number] Date of Birth: August 03, 1928  [name]) CM/SW Contact  Loletha Grayer Beverely Pace, RN Phone Number: 11/28/2020, 11:10 AM  Clinical Narrative:   Case Manager spoke with patient's sonLaverna Peace- [phone] concerning discharge needs and to follow up on Choices for [place]. Laverna Peace was pleasant and polite, conversation had to be reoriented frequently because he rambles on about different things.  Jimmy agreed to Case Administrator, sports for [employer]. CM called referral to Bevely Palmer, [place] Liaison. TOC Team will continue to follow for needs.     Expected Discharge Plan: Home w Hospice Care Barriers to Discharge: Continued Medical Work up  Expected Discharge Plan and Services Expected Discharge Plan: [place] In-house Referral: Clinical Social Work Discharge Planning Services: CM Consult Post Acute Care Choice: Hospice Living arrangements for the past 2 months: [place]                 DME Arranged: 3-N-1           [place] Date [place]: 11/28/20 Time [place]: 1109 Representative spoke with at [place]: Velta Addison with [facility]   Social Determinants of Health ([place]) Interventions    Readmission Risk Interventions No flowsheet data found.

## 2020-11-29 DIAGNOSIS — N179 Acute kidney failure, unspecified: Secondary | ICD-10-CM | POA: Diagnosis not present

## 2020-11-29 DIAGNOSIS — G9349 Other encephalopathy: Secondary | ICD-10-CM | POA: Diagnosis not present

## 2020-11-29 DIAGNOSIS — N183 Chronic kidney disease, stage 3 unspecified: Secondary | ICD-10-CM | POA: Diagnosis not present

## 2020-11-29 DIAGNOSIS — U071 COVID-19: Secondary | ICD-10-CM | POA: Diagnosis not present

## 2020-11-29 DIAGNOSIS — G309 Alzheimer's disease, unspecified: Secondary | ICD-10-CM | POA: Diagnosis not present

## 2020-11-29 LAB — CBC
HCT: 46.7 % (ref 39.0–52.0)
Hemoglobin: 14.8 g/dL (ref 13.0–17.0)
MCH: 26 pg (ref 26.0–34.0)
MCHC: 31.7 g/dL (ref 30.0–36.0)
MCV: 81.9 fL (ref 80.0–100.0)
Platelets: 160 10*3/uL (ref 150–400)
RBC: 5.7 MIL/uL (ref 4.22–5.81)
RDW: 17.7 % — ABNORMAL HIGH (ref 11.5–15.5)
WBC: 12.2 10*3/uL — ABNORMAL HIGH (ref 4.0–10.5)
nRBC: 0 % (ref 0.0–0.2)

## 2020-11-29 LAB — COMPREHENSIVE METABOLIC PANEL
ALT: 28 U/L (ref 0–44)
AST: 27 U/L (ref 15–41)
Albumin: 2.6 g/dL — ABNORMAL LOW (ref 3.5–5.0)
Alkaline Phosphatase: 65 U/L (ref 38–126)
Anion gap: 10 (ref 5–15)
BUN: 31 mg/dL — ABNORMAL HIGH (ref 8–23)
CO2: 23 mmol/L (ref 22–32)
Calcium: 8.5 mg/dL — ABNORMAL LOW (ref 8.9–10.3)
Chloride: 108 mmol/L (ref 98–111)
Creatinine, Ser: 1.11 mg/dL (ref 0.61–1.24)
GFR, Estimated: >60 mL/min (ref >60)
Glucose, Bld: 167 mg/dL — ABNORMAL HIGH (ref 70–99)
Potassium: 4.3 mmol/L (ref 3.5–5.1)
Sodium: 141 mmol/L (ref 135–145)
Total Bilirubin: 1.6 mg/dL — ABNORMAL HIGH (ref 0.3–1.2)
Total Protein: 5.2 g/dL — ABNORMAL LOW (ref 6.5–8.1)

## 2020-11-29 LAB — C-REACTIVE PROTEIN: CRP: 4 mg/dL — ABNORMAL HIGH (ref <1.0)

## 2020-11-29 LAB — D-DIMER, QUANTITATIVE: D-Dimer, Quant: 0.95 ug/mL-FEU — ABNORMAL HIGH (ref 0.00–0.50)

## 2020-11-29 MED ORDER — FENTANYL CITRATE (PF) 100 MCG/2ML IJ SOLN
12.5000 ug | INTRAMUSCULAR | Status: DC | PRN
  Administered 2020-12-01: 12.5 ug via INTRAVENOUS
  Filled 2020-11-29: qty 2

## 2020-11-29 MED ORDER — NEPRO/CARBSTEADY PO LIQD: 237.0000 mL | Freq: Two times a day (BID) | ORAL | Status: DC | PRN

## 2020-11-29 MED ORDER — GUAIFENESIN ER 600 MG PO TB12: 600.0000 mg | ORAL_TABLET | Freq: Two times a day (BID) | ORAL | Status: DC | PRN

## 2020-11-29 MED ORDER — BENZONATATE 100 MG PO CAPS: 200.0000 mg | ORAL_CAPSULE | Freq: Three times a day (TID) | ORAL | Status: DC | PRN

## 2020-11-29 MED ORDER — DOCUSATE SODIUM 100 MG PO CAPS: 100.0000 mg | ORAL_CAPSULE | Freq: Two times a day (BID) | ORAL | Status: DC | PRN

## 2020-11-29 NOTE — Progress Notes (Signed)
  Speech Language Pathology Treatment: Dysphagia  Patient Details Name: Ryan Tyler MRN: [id number] DOB: 1928/03/31 Today's Date: 11/29/2020 Time: 8891-6945 SLP Time Calculation (min) (ACUTE ONLY): 14 min  Assessment / Plan / Recommendation Clinical Impression  SLP followed up for dysphagia. Per RN pt continues to exhibit difficulty with all POs. Assessed pt with puree, nectar, and honey thick liquids this date. Pt with delayed oral transit, reduced laryngeal elevation per palpation, suspected delay in swallow initiation, and wet vocal quality. Pt with delayed significant coughing, congested shortly following PO consumption, with high suspicion of reduced airway protection from POs. Discussed pt condition with Dr. Sloan Leiter and team who report pt transitioning to comfort feeds without aggressive workup . Continue dysphagia 1 and nectar thick liquids with known aspiration risk. No further ST needs identified.    HPI HPI: Pt is a 87 y.o. male with PMHx of HTN, HLD, EtOH use, alcoholic pancreatitis, dementia, bladder cancer s/p TURBT who presented with fever, poor oral intake-he was found to have AKI, COVID-19 infection. CXR 1/30: Mild right lung atelectatic changes. MBS 01/30/19: trace silent penetration during consecutive swallows of thin liquids and trace silent aspiration of oral residuals spilling to pyriforms post swallow. Pt would not attempt a chin tuck despite cueing. A throat clear was successful in clearing aspirate, but pt needed excessive cueing to understand SLP instruction. Quantity of aspiration was judged to be minimal and a dysphagia 2 diet with thin liquids was recommended with observance of swallowing precautions. It was noted that during that MBS, " max encouragement needed for patient participation given grumpiness, confusion and poor hearing ability." Palliative care has been consulted for [facility]      SLP Plan  Discharge SLP treatment due to (comment) (pt transitioning to comfort  measures)       Recommendations  Diet recommendations: Dysphagia 1 (puree);Nectar-thick liquid Liquids provided via: Cup;No straw Medication Administration: Crushed with puree Supervision: Staff to assist with self feeding;Full supervision/cueing for compensatory strategies Compensations: Slow rate;Small sips/bites;Minimize environmental distractions;Clear throat after each swallow Postural Changes and/or Swallow Maneuvers: Seated upright 90 degrees;Upright 30-60 min after meal                Oral Care Recommendations: Oral care BID Follow up Recommendations: 24 hour supervision/assistance SLP Visit Diagnosis: Dysphagia, unspecified (R13.10) Plan: Discharge SLP treatment due to (comment) (pt transitioning to comfort measures)       GO                Ryan Rasmussen MA, CCC-SLP Acute Rehabilitation Services   11/29/2020, 10:56 AM

## 2020-11-29 NOTE — Progress Notes (Addendum)
   Daily Progress Note   Patient Name: Ryan Tyler       Date: 11/29/2020 DOB: July 03, 1928  Age: 87 y.o. MRN#: [id number] Attending Physician: Jonetta Osgood, MD Primary Care Physician: Josetta Huddle, MD Admit Date: 11/20/2020  Reason for Consultation/Follow-up: Establishing goals of care, Non pain symptom management, Pain control and Psychosocial/spiritual support  Subjective: Chart Reviewed. Updates Received.   Patient's prognosis remains poor. Per nursing continues to have poor oral intake with signs of continued aspiration. Somnolent with no acute distress.   Multiple attempts to contact son today and continue disposition discussion. During previous goals of care son was clear that the goals of care should focus on comfort with hopes of discharging patient home with hospice, however there have been concerns regarding son's ability to provide the necessary care needs for patient if discharged home. APS is involved with pending follow-up for guidance with home safety.   Patient is hospice home appropriate (if son agrees) in the setting of advanced age, decreased oral intake for past several days, showing no interest in po's, lethargy, pneumonia due to aspiration/COVID-19, deconditioned, COPD, failure to thrive, continued signs of aspiration, encephalopathy, history of bladder ca s/p TURBT, and expressed wishes for comfort focused care.   Length of Stay: 9 days  Vital Signs: BP (!) 149/71 (BP Location: Right Leg)   Pulse 69   Temp 97.9 F (36.6 C) (Axillary)   Resp 18   Ht 6' (1.829 m)   Wt 85.7 kg   SpO2 95%   BMI 25.63 kg/m  SpO2: SpO2: 95 % O2 Device: O2 Device: Room Air O2 Flow Rate:     Palliative Care Assessment & Plan   Goals of Care/Recommendations:  Comfort focused care  Fentanyl 12.5 mcg PRN for pain/shortness of breath  Continue Seroquel for comfort/behavior management  Patient to have comfort feeds, Spirits with thickner as tolerated for comfort in the  setting of continued aspiration.   Pending APS follow for guidance with disposition.   Patient is appropriate for residential hospice placement however in the setting of COVID positive would have to be considered for [place]/or [facility] [place] once final decisions are made.   PMT will continue to support and follow as needed.   Prognosis: POOR (days-weeks) if patient continues to have poor oral intake and lethargy.   Discharge Planning: To Be Determined  Thank you for allowing the Palliative Medicine Team to assist in the care of this patient.  Time Total: 25 min.   Visit consisted of counseling and education dealing with the complex and emotionally intense issues of symptom management and palliative care in the setting of serious and potentially life-threatening illness.Greater than 50%  of this time was spent counseling and coordinating care related to the above assessment and plan.  Alda Lea, AGPCNP-BC  Palliative Medicine Team [phone]

## 2020-11-29 NOTE — TOC Progression Note (Signed)
Transition of Care [facility]) - Progression Note    Patient Details  Name: Ryan Tyler MRN: [id number] Date of Birth: [birth year]  [facility] CM/SW [place], LCSW Phone Number: 11/29/2020, 5:18 PM  Clinical Narrative:    CSW left voicemail for [place] with APS for updates.    Expected Discharge Plan: Home w Hospice Care Barriers to Discharge: Continued Medical Work up  Expected Discharge Plan and Services Expected Discharge Plan: [place] In-house Referral: Clinical Social Work Discharge Planning Services: CM Consult Post Acute Care Choice: Hospice Living arrangements for the past 2 months: [place]                 DME Arranged: 3-N-1           [place] Date [place]: 11/28/20 Time [place]: 1109 Representative spoke with at [place]: Velta Addison with [facility]   Social Determinants of Health ([place]) Interventions    Readmission Risk Interventions No flowsheet data found.

## 2020-11-29 NOTE — Progress Notes (Signed)
PROGRESS NOTE                                                                                                                                                                                                             Patient Demographics:    Ryan Tyler, is a 87 y.o. male, DOB - 1928/07/14, LHT:[id number]  Outpatient Primary MD for the patient is Josetta Huddle, MD   Admit date - 11/20/2020   LOS - 9  Chief Complaint  Patient presents with  . Fever       Brief Narrative: Patient is a 87 y.o. male with PMHx of HTN, HLD, EtOH use, alcoholic pancreatitis, dementia, bladder cancer s/p TURBT-presenting with fever, poor oral intake-he was found to have AKI, COVID-19 infection-and subsequently admitted to the hospitalist service.  Further hospital stay was complicated by delirium/alcohol withdrawal.  COVID-19 vaccinated status: Vaccinated  Significant Events: 1/30>> Admit to [facility] for AKI/COVID-19 infection  Significant studies: 1/30>>Chest x-ray: Mild right lung atelectasis 2/3>> chest x-ray: Right/left midlung infiltrates. 2/3>> bilateral lower extremity Doppler: Negative for DVT  COVID-19 medications: Steroids: 1/30>> Remdesivir:1/30>>2/3  Antibiotics: Unasyn: 2/2>>2/8  Microbiology data: None  Procedures: None  Consults: Palliative Care  DVT prophylaxis:     Subjective:   No major issues overnight-awakes-lethargic-Per nursing staff and SLP-aspirating every consistency of food.  Not in any distress.  Per nursing staff-no significant oral intake for the past several days    Assessment  & Plan :   Acute metabolic encephalopathy: Multifactorial etiology- hospital delirium, alcohol withdrawal, COVID-19 likely culprits superimposed on dementia.  Initially mentation waxed and waned-however for the past several days has been very lethargic and confused.  On Seroquel nightly-suspect that he will likely remain  encephalopathic/delirious as long as he is hospitalized.  Furthermore-he is slowly deteriorating-hardly any oral intake for the past several days-prognosis is poor.  Palliative care following-arrangements being made for potential hospice care at home.  AKI:-Mild-likely hemodynamically mediated-resolved with supportive care.  EtOH dependence/withdrawl: Per son-patient drinks at least 2 bottles of beer and 2 drinks of liquor on a daily basis-has completed a course of Librium taper.  Should be out of the window for alcohol withdrawal symptoms.  Mentation still confused-but I suspect this is mostly from dementia/delirium and encephalopathy due to pneumonia.   Pneumonia due to COVID-19 and aspiration/bacterial: Overtly aspirating-confused-on comfort feeding.  Remains very encephalopathic and debilitated.  Palliative care following-recommendations are to continue with current care but no further escalation in care.  Awaiting further recommendations from palliative care- will complete 7 days of Unasyn today.  Fever: afebrile O2 requirements:  SpO2: 95 %   COVID-19 Labs: Recent Labs    11/27/20 0402 11/28/20 0403 11/29/20 0106  DDIMER 1.36* 1.45* 0.95*  CRP 2.3* 2.0* 4.0*    No results found for: BNP  No results for input(s): PROCALCITON in the last 168 hours.  Lab Results  Component Value Date   SARSCOV2NAA POSITIVE (A) 11/20/2020   [place] NEGATIVE 05/24/2020   SARSCOV2NAA NOT DETECTED 01/23/2019     Elevated D-dimer: Due to COVID-19 related inflammation-lower extremity Dopplers negative-on prophylactic heparin.  Not hypoxic-clinical exam chest which is more consistent with aspiration-follow closely.  HTN: BP stable-lisinopril/HCTZ on hold.  Resume when able.  GERD: Continue PPI  History of suspected COPD: Continue bronchodilators  History of bladder cancer  Hard of hearing  Severe deconditioning/debility/failure to thrive-very weak-debilitated-overtly aspirating-hardly any  oral intake for the past several days-palliative care following-DNR in place.  Poor prognosis.  Palliative care: DNR in [place] frail/debilitated-with multifactorial etiology-suspected aspiration-unfortunately-not much of progress in spite of above-noted cares.  Palliative care now following-spoke to palliative care NP on 2/5--family aware of tenuous clinical status-no further escalation in care-they understand aspiration risk-they do not want feeding tubes.  Plans were to get him home with hospice care-however given issues-see above-this may not be possible.  Social work/case manager/palliative care following-May need to look at residential hospice instead.  GI prophylaxis: PPI  ABG:    Component Value Date/Time   HCO3 22.1 01/23/2019 2015   TCO2 23 01/23/2019 2015   ACIDBASEDEF 4.0 (H) 01/23/2019 2015   O2SAT 78.0 01/23/2019 2015    Vent Settings: N/A  Condition -Guarded  Family Communication  :  Son-Remer [phone]-on 2/7-palliative care reaching out to him on 2/8.  Code Status : DNR  Diet :  Diet Order            DIET - DYS 1 Room service appropriate? Yes; Fluid consistency: Nectar Thick  Diet effective now                  Disposition Plan  :   Status is: Inpatient  Remains inpatient appropriate because:Inpatient level of care appropriate due to severity of illness   Dispo: The patient is from: Home              Anticipated d/c is to: Home              Anticipated d/c date is: > 1-2 days              Patient currently is not medically stable to d/c.   Difficult to place patient No   Barriers to discharge: Ongoing encephalopathy-not yet at baseline.  Antimicorbials  :    Anti-infectives (From admission, onward)   Start     Dose/Rate Route Frequency Ordered Stop   11/23/20 1600  ampicillin-sulbactam (UNASYN) 1.5 g in sodium chloride 0.9 % 100 mL IVPB        1.5 g 200 mL/hr over 30 Minutes Intravenous Every 8 hours 11/23/20 1510 11/28/20 1801    11/21/20 1000  remdesivir 100 mg in sodium chloride 0.9 % 100 mL IVPB       "Followed by" Linked Group Details   100 mg 200 mL/hr over 30 Minutes Intravenous Daily 11/20/20 0633 11/24/20 0959  11/20/20 0730  remdesivir 200 mg in sodium chloride 0.9% 250 mL IVPB       "Followed by" Linked Group Details   200 mg 580 mL/hr over 30 Minutes Intravenous Once 11/20/20 1660 11/20/20 0900      Inpatient Medications  Scheduled Meds: . fluticasone furoate-vilanterol  1 puff Inhalation Daily  . pantoprazole  40 mg Oral Daily  . predniSONE  20 mg Oral Q breakfast  . QUEtiapine  25 mg Oral QHS  . sodium chloride flush  3 mL Intravenous Q12H  . sodium chloride flush  3 mL Intravenous Q12H  . spiritus frumenti  1 each Oral BID  . umeclidinium bromide  1 puff Inhalation Daily   Continuous Infusions: . sodium chloride     PRN Meds:.sodium chloride, acetaminophen, albuterol, benzonatate, bisacodyl, chlordiazePOXIDE, chlorpheniramine-HYDROcodone, docusate sodium, feeding supplement (NEPRO CARB STEADY), fentaNYL (SUBLIMAZE) injection, guaiFENesin, haloperidol lactate, [DISCONTINUED] ondansetron **OR** ondansetron (ZOFRAN) IV, polyethylene glycol, Resource ThickenUp Clear, sodium chloride flush, sodium phosphate   Time Spent in minutes  25  See all Orders from today for further details   Oren Binet M.D on 11/29/2020 at 2:31 PM  To page go to [email or web address] - use universal password  [facility] -  Office  [phone]    Objective:   Vitals:   11/29/20 0023 11/29/20 0630 11/29/20 0747 11/29/20 1211  BP: 114/78 (!) 146/82 120/73 (!) 149/71  Pulse: 72 76 79 69  Resp: 19 20 18 18   Temp: 97.8 F (36.6 C) 97.6 F (36.4 C) 97.9 F (36.6 C) 97.9 F (36.6 C)  TempSrc: Axillary Axillary Axillary Axillary  SpO2: 93% 96% 95% 95%  Weight:      Height:        Wt Readings from Last 3 Encounters:  11/26/20 85.7 kg  05/24/20 77.1 kg  01/31/19 78.1 kg     Intake/Output Summary  (Last 24 hours) at 11/29/2020 1431 Last data filed at 11/28/2020 1841 Gross per 24 hour  Intake 200 ml  Output --  Net 200 ml     Physical Exam Gen Exam: Confused but not in any distress. HEENT:atraumatic, normocephalic Chest: Continues to have transmitted upper airway sounds CVS:S1S2 regular Abdomen:soft non tender, non distended Extremities:no edema Neurology: Generalized weakness but no focal deficits  skin: no rash   Data Review:    CBC Recent Labs  Lab 11/23/20 0303 11/24/20 0114 11/25/20 0123 11/26/20 0759 11/27/20 0402 11/28/20 0403 11/29/20 0106  WBC 12.5* 17.3* 11.5* 9.2 12.1* 11.5* 12.2*  HGB 14.7 15.4 13.5 13.3 14.4 14.5 14.8  HCT 46.8 50.0 43.9 40.5 45.1 47.7 46.7  PLT 144* 167 139* 142* 164 153 160  MCV 82.1 82.2 82.8 80.5 81.7 82.4 81.9  MCH 25.8* 25.3* 25.5* 26.4 26.1 25.0* 26.0  MCHC 31.4 30.8 30.8 32.8 31.9 30.4 31.7  RDW 15.9* 16.6* 16.0* 16.4* 16.5* 17.0* 17.7*  LYMPHSABS 0.2* 0.6* 0.2*  --   --   --   --   MONOABS 0.9 1.2* 1.2*  --   --   --   --   EOSABS 0.0 0.0 0.0  --   --   --   --   BASOSABS 0.0 0.0 0.0  --   --   --   --     Chemistries  Recent Labs  Lab 11/23/20 0303 11/24/20 0114 11/25/20 0123 11/26/20 0759 11/27/20 0402 11/28/20 0403 11/29/20 0106  NA 136 139 141 137 139 139 141  K 4.9 4.5 4.6 4.3 4.1 4.4 4.3  CL 101 105 106 105 104 106 108  CO2 23 21* 23 23 26 23 23   GLUCOSE 244* 203* 175* 193* 170* 141* 167*  BUN 31* 34* 35* 34* 31* 30* 31*  CREATININE 1.07 1.29* 1.26* 1.01 1.05 0.93 1.11  CALCIUM 9.0 8.8* 8.4* 8.2* 8.4* 8.5* 8.5*  MG 1.9 1.9 1.7  --   --   --   --   AST 38 45* 30 34 33 31 27  ALT 19 24 23 27  32 27 28  ALKPHOS 89 87 72 66 71 66 65  BILITOT 1.1 1.3* 1.6* 1.6* 1.5* 1.2 1.6*   ------------------------------------------------------------------------------------------------------------------ No results for input(s): CHOL, HDL, LDLCALC, TRIG, CHOLHDL, LDLDIRECT in the last 72 hours.  Lab Results  Component  Value Date   HGBA1C 5.9 (H) 05/24/2020   ------------------------------------------------------------------------------------------------------------------ No results for input(s): TSH, T4TOTAL, T3FREE, THYROIDAB in the last 72 hours.  Invalid input(s): FREET3 ------------------------------------------------------------------------------------------------------------------ No results for input(s): VITAMINB12, FOLATE, FERRITIN, TIBC, IRON, RETICCTPCT in the last 72 hours.  Coagulation profile No results for input(s): INR, PROTIME in the last 168 hours.  Recent Labs    11/28/20 0403 11/29/20 0106  DDIMER 1.45* 0.95*    Cardiac Enzymes No results for input(s): CKMB, TROPONINI, MYOGLOBIN in the last 168 hours.  Invalid input(s): CK ------------------------------------------------------------------------------------------------------------------ No results found for: BNP  Micro Results Recent Results (from the past 240 hour(s))  SARS Coronavirus 2 by RT PCR (hospital order, performed in [facility] hospital lab) Nasopharyngeal Nasopharyngeal Swab     Status: Abnormal   Collection Time: 11/20/20  2:59 AM   Specimen: Nasopharyngeal Swab  Result Value Ref Range Status   SARS Coronavirus 2 POSITIVE (A) NEGATIVE Final    Comment: RESULT CALLED TO, READ BACK BY AND VERIFIED WITH: Thurman Coyer [id number] 11/20/2020 T. TYSOR (NOTE) SARS-CoV-2 target nucleic acids are DETECTED  SARS-CoV-2 RNA is generally detectable in upper respiratory specimens  during the acute phase of infection.  Positive results are indicative  of the presence of the identified virus, but do not rule out bacterial infection or co-infection with other pathogens not detected by the test.  Clinical correlation with patient history and  other diagnostic information is necessary to determine patient infection status.  The expected result is negative.  Fact Sheet for Patients:    StrictlyIdeas.no   Fact Sheet for Healthcare Providers:   BankingDealers.co.za    This test is not yet approved or cleared by the [place] FDA and  has been authorized for detection and/or diagnosis of SARS-CoV-2 by FDA under an Emergency Use Authorization (EUA).  This EUA will remain in effect (meaning thi s test can be used) for the duration of  the COVID-19 declaration under Section 564(b)(1) of the Act, 21 U.S.C. section 360-bbb-3(b)(1), unless the authorization is terminated or revoked sooner.  Performed at [facility], [place] [address] [place], [place] [address]     Radiology Reports DG Chest [place] 1 View  Result Date: 11/24/2020 CLINICAL DATA:  Shortness of breath. EXAM: PORTABLE CHEST 1 VIEW COMPARISON:  11/20/2020.  05/24/2020.  01/23/2019. FINDINGS: Mediastinum and hilar structures normal. Heart size normal. Low lung volumes with bibasilar atelectasis. Pleuroparenchymal thickening right upper lung suggesting scarring. Mild diffuse right lung and left mid lung interstitial infiltrates. No pleural effusion or pneumothorax. Surgical clips right upper abdomen. IMPRESSION: 1. Mild diffuse right lung and left mid lung interstitial infiltrates. 2. Low lung volumes with bibasilar atelectasis. Probable right upper lung pleural-parenchymal scarring. Electronically Signed  By: [place]   On: 11/24/2020 06:43   DG Chest Portable 1 View  Result Date: 11/20/2020 CLINICAL DATA:  Shortness of breath and fevers EXAM: PORTABLE CHEST 1 VIEW COMPARISON:  05/24/2020 FINDINGS: Cardiac shadow is stable. Aortic calcifications are noted. Left lung is clear. Right lung demonstrates some upper lobe atelectatic changes. No bony abnormality is seen. IMPRESSION: Mild right lung atelectatic changes. Electronically Signed   By: Inez Catalina M.D.   On: 11/20/2020 03:18   VAS [place] LOWER EXTREMITY VENOUS (DVT)  Result Date: 11/24/2020  Lower Venous  DVT Study Indications: Covid +ve, increasing d dimer.  Comparison Study: no prior Performing Technologist: Abram Sander RVS  Examination Guidelines: A complete evaluation includes B-mode imaging, spectral Doppler, color Doppler, and power Doppler as needed of all accessible portions of each vessel. Bilateral testing is considered an integral part of a complete examination. Limited examinations for reoccurring indications may be performed as noted. The reflux portion of the exam is performed with the patient in reverse Trendelenburg.  +---------+---------------+---------+-----------+----------+--------------+ RIGHT    CompressibilityPhasicitySpontaneityPropertiesThrombus Aging +---------+---------------+---------+-----------+----------+--------------+ CFV      Full           Yes      Yes                                 +---------+---------------+---------+-----------+----------+--------------+ SFJ      Full                                                        +---------+---------------+---------+-----------+----------+--------------+ FV Prox  Full                                                        +---------+---------------+---------+-----------+----------+--------------+ FV Mid   Full                                                        +---------+---------------+---------+-----------+----------+--------------+ FV DistalFull                                                        +---------+---------------+---------+-----------+----------+--------------+ PFV      Full                                                        +---------+---------------+---------+-----------+----------+--------------+ POP      Full           Yes      Yes                                 +---------+---------------+---------+-----------+----------+--------------+  PTV      Full                                                         +---------+---------------+---------+-----------+----------+--------------+ PERO     Full                                                        +---------+---------------+---------+-----------+----------+--------------+   +---------+---------------+---------+-----------+----------+--------------+ LEFT     CompressibilityPhasicitySpontaneityPropertiesThrombus Aging +---------+---------------+---------+-----------+----------+--------------+ CFV      Full           Yes      Yes                                 +---------+---------------+---------+-----------+----------+--------------+ SFJ      Full                                                        +---------+---------------+---------+-----------+----------+--------------+ FV Prox  Full                                                        +---------+---------------+---------+-----------+----------+--------------+ FV Mid   Full                                                        +---------+---------------+---------+-----------+----------+--------------+ FV DistalFull                                                        +---------+---------------+---------+-----------+----------+--------------+ PFV      Full                                                        +---------+---------------+---------+-----------+----------+--------------+ POP      Full           Yes      Yes                                 +---------+---------------+---------+-----------+----------+--------------+ PTV      Full                                                        +---------+---------------+---------+-----------+----------+--------------+  PERO     Full                                                        +---------+---------------+---------+-----------+----------+--------------+     Summary: BILATERAL: - No evidence of deep vein thrombosis seen in the lower extremities, bilaterally. - No evidence of  superficial venous thrombosis in the lower extremities, bilaterally. -No evidence of popliteal cyst, bilaterally.   *See table(s) above for measurements and observations. Electronically signed by Jamelle Haring on 11/24/2020 at 3:49:43 PM.    Final

## 2020-11-30 DIAGNOSIS — N179 Acute kidney failure, unspecified: Secondary | ICD-10-CM | POA: Diagnosis not present

## 2020-11-30 DIAGNOSIS — G309 Alzheimer's disease, unspecified: Secondary | ICD-10-CM | POA: Diagnosis not present

## 2020-11-30 DIAGNOSIS — U071 COVID-19: Secondary | ICD-10-CM | POA: Diagnosis not present

## 2020-11-30 DIAGNOSIS — G9349 Other encephalopathy: Secondary | ICD-10-CM | POA: Diagnosis not present

## 2020-11-30 MED ORDER — LIP MEDEX EX OINT
TOPICAL_OINTMENT | CUTANEOUS | Status: DC | PRN
  Filled 2020-11-30: qty 7

## 2020-11-30 MED ORDER — ENOXAPARIN SODIUM 40 MG/0.4ML ~~LOC~~ SOLN
40.0000 mg | SUBCUTANEOUS | Status: DC
  Administered 2020-11-30 – 2020-12-05 (×5): 40 mg via SUBCUTANEOUS
  Filled 2020-11-30 (×6): qty 0.4

## 2020-11-30 MED ORDER — PREDNISONE 5 MG PO TABS
10.0000 mg | ORAL_TABLET | Freq: Every day | ORAL | Status: AC
  Administered 2020-11-30: 10 mg via ORAL
  Filled 2020-11-30: qty 2

## 2020-11-30 NOTE — Progress Notes (Signed)
PROGRESS NOTE                                                                                                                                                                                                             Patient Demographics:    Ryan Tyler, is a 87 y.o. male, DOB - 08/12/1928, UTM:[id number]  Outpatient Primary MD for the patient is Josetta Huddle, MD   Admit date - 11/20/2020   LOS - 10  Chief Complaint  Patient presents with  . Fever       Brief Narrative: Patient is a 87 y.o. male with PMHx of HTN, HLD, EtOH use, alcoholic pancreatitis, dementia, bladder cancer s/p TURBT-presenting with fever, poor oral intake-he was found to have AKI, COVID-19 infection-and subsequently admitted to the hospitalist service.  Further hospital stay was complicated by delirium/alcohol withdrawal.  COVID-19 vaccinated status: Vaccinated  Significant Events: 1/30>> Admit to [facility] for AKI/COVID-19 infection  Significant studies: 1/30>>Chest x-ray: Mild right lung atelectasis 2/3>> chest x-ray: Right/left midlung infiltrates. 2/3>> bilateral lower extremity Doppler: Negative for DVT  COVID-19 medications: Steroids: 1/30>> Remdesivir:1/30>>2/3  Antibiotics: Unasyn: 2/2>>2/8  Microbiology data: None  Procedures: None  Consults: Palliative Care  DVT prophylaxis:     Subjective:   Remains unchanged-Per nursing staff-very poor oral intake-lethargic-still confused but following some commands today.  Son was supposed to visit today-however he has not yet.   Assessment  & Plan :   Acute metabolic encephalopathy: Multifactorial etiology- hospital delirium, alcohol withdrawal, COVID-19 likely culprits superimposed on dementia.  Initially mentation waxed and waned-however for the past several days has been very lethargic and confused.  On Seroquel nightly-suspect that he will likely remain encephalopathic/delirious as  long as he is hospitalized.  Furthermore-he is slowly deteriorating-hardly any oral intake for the past several days-prognosis is poor.  Palliative care following-social work following-Adult Protective Services-following-and has not yet made a decision whether it is safe for patient to go home (bedbugs/son with probable alcohol use-concern that he may not be able to manage patient at EOL)   AKI:-Mild-likely hemodynamically mediated-resolved with supportive care.  EtOH dependence/withdrawl: Per son-patient drinks at least 2 bottles of beer and 2 drinks of liquor on a daily basis-has completed a course of Librium taper.  Should be out of the window for alcohol withdrawal symptoms.  Mentation still confused-but I suspect  this is mostly from dementia/delirium and encephalopathy due to pneumonia.  Allow-alcohol use for comfort-pleasure.  Pneumonia due to COVID-19 and aspiration/bacterial: Overtly aspirating-confused-on comfort feeding.  Remains very encephalopathic and debilitated.  Palliative care following-recommendations are to continue with current care but no further escalation in care.  Has completed a course of IV Unasyn.  Fever: afebrile O2 requirements:  SpO2: 98 %   COVID-19 Labs: Recent Labs    11/28/20 0403 11/29/20 0106  DDIMER 1.45* 0.95*  CRP 2.0* 4.0*    No results found for: BNP  No results for input(s): PROCALCITON in the last 168 hours.  Lab Results  Component Value Date   SARSCOV2NAA POSITIVE (A) 11/20/2020   [place] NEGATIVE 05/24/2020   SARSCOV2NAA NOT DETECTED 01/23/2019     Elevated D-dimer: Due to COVID-19 related inflammation-lower extremity Dopplers negative-on prophylactic heparin.  Not hypoxic-clinical exam chest which is more consistent with aspiration-follow closely.  HTN: BP stable-lisinopril/HCTZ on hold.  Resume when able.  GERD: Continue PPI  History of suspected COPD: Continue bronchodilators  History of bladder cancer  Hard of  hearing  Severe deconditioning/debility/failure to thrive-very weak-debilitated-overtly aspirating-hardly any oral intake for the past several days-palliative care following-DNR in place.  Poor prognosis.  Palliative care: DNR in [place] frail/debilitated-with multifactorial etiology-suspected aspiration-unfortunately-not much of progress in spite of above-noted cares.  Palliative care now following-spoke to palliative care NP on 2/5--family aware of tenuous clinical status-no further escalation in care-they understand aspiration risk-they do not want feeding tubes.  Plans were to get him home with hospice care-however when hospice agency called son to arrange hospice care at home-patient's son was apparently intoxicated himself-and said the patient was a [employer]-and would "tough it out".  APS now involved-if home hospice not possible-May need residential hospice.  Awaiting further input from APS/social work/palliative care  GI prophylaxis: PPI  ABG:    Component Value Date/Time   HCO3 22.1 01/23/2019 2015   TCO2 23 01/23/2019 2015   ACIDBASEDEF 4.0 (H) 01/23/2019 2015   O2SAT 78.0 01/23/2019 2015    Vent Settings: N/A  Condition -Guarded  Family Communication  :  Son-Aristotle [phone]-left a voicemail on 2/9.  Per RN-he had spoken with the son earlier today.  Son was supposed to come visit patient today-as he is close to [facility] son has not showed up as of yet.  Code Status : DNR  Diet :  Diet Order            DIET - DYS 1 Room service appropriate? Yes; Fluid consistency: Nectar Thick  Diet effective now                  Disposition Plan  :   Status is: Inpatient  Remains inpatient appropriate because:Inpatient level of care appropriate due to severity of illness   Dispo: The patient is from: Home              Anticipated d/c is to: Home              Anticipated d/c date is: > 1-2 days              Patient currently is not medically stable to d/c.    Difficult to place patient No   Barriers to discharge: Ongoing encephalopathy-not yet at baseline.  Antimicorbials  :    Anti-infectives (From admission, onward)   Start     Dose/Rate Route Frequency Ordered Stop   11/23/20 1600  ampicillin-sulbactam (UNASYN) 1.5 g in sodium chloride  0.9 % 100 mL IVPB        1.5 g 200 mL/hr over 30 Minutes Intravenous Every 8 hours 11/23/20 1510 11/28/20 1801   11/21/20 1000  remdesivir 100 mg in sodium chloride 0.9 % 100 mL IVPB       "Followed by" Linked Group Details   100 mg 200 mL/hr over 30 Minutes Intravenous Daily 11/20/20 0633 11/24/20 0959   11/20/20 0730  remdesivir 200 mg in sodium chloride 0.9% 250 mL IVPB       "Followed by" Linked Group Details   200 mg 580 mL/hr over 30 Minutes Intravenous Once 11/20/20 8657 11/20/20 0900      Inpatient Medications  Scheduled Meds: . fluticasone furoate-vilanterol  1 puff Inhalation Daily  . pantoprazole  40 mg Oral Daily  . QUEtiapine  25 mg Oral QHS  . sodium chloride flush  3 mL Intravenous Q12H  . sodium chloride flush  3 mL Intravenous Q12H  . spiritus frumenti  1 each Oral BID  . umeclidinium bromide  1 puff Inhalation Daily   Continuous Infusions: . sodium chloride     PRN Meds:.sodium chloride, acetaminophen, albuterol, benzonatate, bisacodyl, chlordiazePOXIDE, chlorpheniramine-HYDROcodone, docusate sodium, feeding supplement (NEPRO CARB STEADY), fentaNYL (SUBLIMAZE) injection, guaiFENesin, haloperidol lactate, lip balm, [DISCONTINUED] ondansetron **OR** ondansetron (ZOFRAN) IV, polyethylene glycol, Resource ThickenUp Clear, sodium chloride flush, sodium phosphate   Time Spent in minutes  25  See all Orders from today for further details   Oren Binet M.D on 11/30/2020 at 3:03 PM  To page go to [email or web address] - use universal password  [facility] -  Office  [phone]    Objective:   Vitals:   11/29/20 2354 11/30/20 0357 11/30/20 0809 11/30/20 1234  BP: 122/88  131/83 130/81 125/78  Pulse: 65 70 74 75  Resp: 18 16 18 19   Temp: 97.7 F (36.5 C) 98 F (36.7 C) 98.1 F (36.7 C) 99 F (37.2 C)  TempSrc: Axillary Axillary Axillary Axillary  SpO2: 90% 95% 96% 98%  Weight:      Height:        Wt Readings from Last 3 Encounters:  11/26/20 85.7 kg  05/24/20 77.1 kg  01/31/19 78.1 kg     Intake/Output Summary (Last 24 hours) at 11/30/2020 1503 Last data filed at 11/30/2020 0900 Gross per 24 hour  Intake 60 ml  Output -  Net 60 ml     Physical Exam Gen Exam: Confused-frail-lethargic-but not in any distress.   HEENT:atraumatic, normocephalic Chest: Continues to have significant transmitted upper airway sounds  CVS:S1S2 regular Abdomen:soft non tender, non distended Extremities:no edema Neurology: Generalized weakness-but moving all 4 extremities. Skin: no rash   Data Review:    CBC Recent Labs  Lab 11/24/20 0114 11/25/20 0123 11/26/20 0759 11/27/20 0402 11/28/20 0403 11/29/20 0106  WBC 17.3* 11.5* 9.2 12.1* 11.5* 12.2*  HGB 15.4 13.5 13.3 14.4 14.5 14.8  HCT 50.0 43.9 40.5 45.1 47.7 46.7  PLT 167 139* 142* 164 153 160  MCV 82.2 82.8 80.5 81.7 82.4 81.9  MCH 25.3* 25.5* 26.4 26.1 25.0* 26.0  MCHC 30.8 30.8 32.8 31.9 30.4 31.7  RDW 16.6* 16.0* 16.4* 16.5* 17.0* 17.7*  LYMPHSABS 0.6* 0.2*  --   --   --   --   MONOABS 1.2* 1.2*  --   --   --   --   EOSABS 0.0 0.0  --   --   --   --   BASOSABS 0.0 0.0  --   --   --   --  Chemistries  Recent Labs  Lab 11/24/20 0114 11/25/20 0123 11/26/20 0759 11/27/20 0402 11/28/20 0403 11/29/20 0106  NA 139 141 137 139 139 141  K 4.5 4.6 4.3 4.1 4.4 4.3  CL 105 106 105 104 106 108  CO2 21* 23 23 26 23 23   GLUCOSE 203* 175* 193* 170* 141* 167*  BUN 34* 35* 34* 31* 30* 31*  CREATININE 1.29* 1.26* 1.01 1.05 0.93 1.11  CALCIUM 8.8* 8.4* 8.2* 8.4* 8.5* 8.5*  MG 1.9 1.7  --   --   --   --   AST 45* 30 34 33 31 27  ALT 24 23 27  32 27 28  ALKPHOS 87 72 66 71 66 65  BILITOT 1.3*  1.6* 1.6* 1.5* 1.2 1.6*   ------------------------------------------------------------------------------------------------------------------ No results for input(s): CHOL, HDL, LDLCALC, TRIG, CHOLHDL, LDLDIRECT in the last 72 hours.  Lab Results  Component Value Date   HGBA1C 5.9 (H) 05/24/2020   ------------------------------------------------------------------------------------------------------------------ No results for input(s): TSH, T4TOTAL, T3FREE, THYROIDAB in the last 72 hours.  Invalid input(s): FREET3 ------------------------------------------------------------------------------------------------------------------ No results for input(s): VITAMINB12, FOLATE, FERRITIN, TIBC, IRON, RETICCTPCT in the last 72 hours.  Coagulation profile No results for input(s): INR, PROTIME in the last 168 hours.  Recent Labs    11/28/20 0403 11/29/20 0106  DDIMER 1.45* 0.95*    Cardiac Enzymes No results for input(s): CKMB, TROPONINI, MYOGLOBIN in the last 168 hours.  Invalid input(s): CK ------------------------------------------------------------------------------------------------------------------ No results found for: BNP  Micro Results No results found for this or any previous visit (from the past 240 hour(s)).  Radiology Reports DG Chest Port 1 View  Result Date: 11/24/2020 CLINICAL DATA:  Shortness of breath. EXAM: PORTABLE CHEST 1 VIEW COMPARISON:  11/20/2020.  05/24/2020.  01/23/2019. FINDINGS: Mediastinum and hilar structures normal. Heart size normal. Low lung volumes with bibasilar atelectasis. Pleuroparenchymal thickening right upper lung suggesting scarring. Mild diffuse right lung and left mid lung interstitial infiltrates. No pleural effusion or pneumothorax. Surgical clips right upper abdomen. IMPRESSION: 1. Mild diffuse right lung and left mid lung interstitial infiltrates. 2. Low lung volumes with bibasilar atelectasis. Probable right upper lung pleural-parenchymal  scarring. Electronically Signed   By: Marcello Moores  Register   On: 11/24/2020 06:43   DG Chest Portable 1 View  Result Date: 11/20/2020 CLINICAL DATA:  Shortness of breath and fevers EXAM: PORTABLE CHEST 1 VIEW COMPARISON:  05/24/2020 FINDINGS: Cardiac shadow is stable. Aortic calcifications are noted. Left lung is clear. Right lung demonstrates some upper lobe atelectatic changes. No bony abnormality is seen. IMPRESSION: Mild right lung atelectatic changes. Electronically Signed   By: Inez Catalina M.D.   On: 11/20/2020 03:18   VAS [place] LOWER EXTREMITY VENOUS (DVT)  Result Date: 11/24/2020  Lower Venous DVT Study Indications: Covid +ve, increasing d dimer.  Comparison Study: no prior Performing Technologist: Abram Sander RVS  Examination Guidelines: A complete evaluation includes B-mode imaging, spectral Doppler, color Doppler, and power Doppler as needed of all accessible portions of each vessel. Bilateral testing is considered an integral part of a complete examination. Limited examinations for reoccurring indications may be performed as noted. The reflux portion of the exam is performed with the patient in reverse Trendelenburg.  +---------+---------------+---------+-----------+----------+--------------+ RIGHT    CompressibilityPhasicitySpontaneityPropertiesThrombus Aging +---------+---------------+---------+-----------+----------+--------------+ CFV      Full           Yes      Yes                                 +---------+---------------+---------+-----------+----------+--------------+  SFJ      Full                                                        +---------+---------------+---------+-----------+----------+--------------+ FV Prox  Full                                                        +---------+---------------+---------+-----------+----------+--------------+ FV Mid   Full                                                         +---------+---------------+---------+-----------+----------+--------------+ FV DistalFull                                                        +---------+---------------+---------+-----------+----------+--------------+ PFV      Full                                                        +---------+---------------+---------+-----------+----------+--------------+ POP      Full           Yes      Yes                                 +---------+---------------+---------+-----------+----------+--------------+ PTV      Full                                                        +---------+---------------+---------+-----------+----------+--------------+ PERO     Full                                                        +---------+---------------+---------+-----------+----------+--------------+   +---------+---------------+---------+-----------+----------+--------------+ LEFT     CompressibilityPhasicitySpontaneityPropertiesThrombus Aging +---------+---------------+---------+-----------+----------+--------------+ CFV      Full           Yes      Yes                                 +---------+---------------+---------+-----------+----------+--------------+ SFJ      Full                                                        +---------+---------------+---------+-----------+----------+--------------+  FV Prox  Full                                                        +---------+---------------+---------+-----------+----------+--------------+ FV Mid   Full                                                        +---------+---------------+---------+-----------+----------+--------------+ FV DistalFull                                                        +---------+---------------+---------+-----------+----------+--------------+ PFV      Full                                                         +---------+---------------+---------+-----------+----------+--------------+ POP      Full           Yes      Yes                                 +---------+---------------+---------+-----------+----------+--------------+ PTV      Full                                                        +---------+---------------+---------+-----------+----------+--------------+ PERO     Full                                                        +---------+---------------+---------+-----------+----------+--------------+     Summary: BILATERAL: - No evidence of deep vein thrombosis seen in the lower extremities, bilaterally. - No evidence of superficial venous thrombosis in the lower extremities, bilaterally. -No evidence of popliteal cyst, bilaterally.   *See table(s) above for measurements and observations. Electronically signed by Jamelle Haring on 11/24/2020 at 3:49:43 PM.    Final

## 2020-11-30 NOTE — TOC Progression Note (Signed)
Transition of Care [facility]) - Progression Note    Patient Details  Name: Ryan Tyler MRN: [id number] Date of Birth: November 08, 1927  Transition of [facility] CM/SW [place], LCSW Phone Number: 11/30/2020, 9:38 AM  Clinical Narrative:    9:38 AM CSW received voicemail from [place] with APS stating that he visited patient's son and found him to be appropriately concerned for his father, though he did appear to be intoxicated and APS is still unsure of discharging patient home. He stated he asked son about [facility] instead but son became tearful and stated that he promised patient that he could die at home as patient's wife died at [place] and not in the comfort of her home.   CSW left [place] a return message to further discuss case.    Expected Discharge Plan: Home w Hospice Care Barriers to Discharge: Continued Medical Work up  Expected Discharge Plan and Services Expected Discharge Plan: [place] In-house Referral: Clinical Social Work Discharge Planning Services: CM Consult Post Acute Care Choice: Hospice Living arrangements for the past 2 months: [place]                 DME Arranged: 3-N-1           [place] Date [place]: 11/28/20 Time [place]: 1109 Representative spoke with at [place]: Velta Addison with [facility]   Social Determinants of Health ([place]) Interventions    Readmission Risk Interventions No flowsheet data found.

## 2020-11-30 NOTE — Plan of Care (Signed)

## 2020-12-01 DIAGNOSIS — Z66 Do not resuscitate: Secondary | ICD-10-CM | POA: Diagnosis not present

## 2020-12-01 DIAGNOSIS — N179 Acute kidney failure, unspecified: Secondary | ICD-10-CM | POA: Diagnosis not present

## 2020-12-01 DIAGNOSIS — G9349 Other encephalopathy: Secondary | ICD-10-CM | POA: Diagnosis not present

## 2020-12-01 DIAGNOSIS — G309 Alzheimer's disease, unspecified: Secondary | ICD-10-CM | POA: Diagnosis not present

## 2020-12-01 DIAGNOSIS — U071 COVID-19: Secondary | ICD-10-CM | POA: Diagnosis not present

## 2020-12-01 DIAGNOSIS — N183 Chronic kidney disease, stage 3 unspecified: Secondary | ICD-10-CM | POA: Diagnosis not present

## 2020-12-01 MED ORDER — MORPHINE SULFATE (CONCENTRATE) 10 MG/0.5ML PO SOLN
5.0000 mg | ORAL | Status: DC | PRN
  Administered 2020-12-03: 5 mg via ORAL
  Filled 2020-12-01: qty 0.5

## 2020-12-01 MED ORDER — SPIRITUS FRUMENTI
1.0000 | Freq: Once | ORAL | Status: DC
  Filled 2020-12-01: qty 1

## 2020-12-01 MED ORDER — CHLORDIAZEPOXIDE HCL 5 MG PO CAPS
15.0000 mg | ORAL_CAPSULE | Freq: Three times a day (TID) | ORAL | Status: DC | PRN
  Administered 2020-12-01: 15 mg via ORAL
  Filled 2020-12-01: qty 3

## 2020-12-01 MED ORDER — SPIRITUS FRUMENTI
1.0000 | Freq: Three times a day (TID) | ORAL | Status: DC | PRN
  Administered 2020-12-01: 1 via ORAL
  Filled 2020-12-01 (×2): qty 1

## 2020-12-01 NOTE — Plan of Care (Signed)
Pt c/o chest pain overnight; ekg performed; MD notified; gave prn fentanyl; pt expressed relief. Low bed is locked in lowest position; side rails up; continue to monitor and follow plan of care.    Problem: Education: Goal: Knowledge of General Education information will improve Description: Including pain rating scale, medication(s)/side effects and non-pharmacologic comfort measures 12/01/2020 0737 by Tollie Eth, RN Outcome: Progressing 12/01/2020 0303 by Tollie Eth, RN Outcome: Progressing   Problem: Health Behavior/Discharge Planning: Goal: Ability to manage health-related needs will improve 12/01/2020 0737 by Tollie Eth, RN Outcome: Progressing 12/01/2020 0303 by Tollie Eth, RN Outcome: Progressing   Problem: Clinical Measurements: Goal: Ability to maintain clinical measurements within normal limits will improve 12/01/2020 0737 by Tollie Eth, RN Outcome: Progressing 12/01/2020 0303 by Tollie Eth, RN Outcome: Progressing Goal: Will remain free from infection 12/01/2020 0737 by Tollie Eth, RN Outcome: Progressing 12/01/2020 0303 by Tollie Eth, RN Outcome: Progressing Goal: Diagnostic test results will improve 12/01/2020 0737 by Tollie Eth, RN Outcome: Progressing 12/01/2020 0303 by Tollie Eth, RN Outcome: Progressing Goal: Respiratory complications will improve 12/01/2020 0737 by Tollie Eth, RN Outcome: Progressing 12/01/2020 0303 by Tollie Eth, RN Outcome: Progressing Goal: Cardiovascular complication will be avoided 12/01/2020 0737 by Tollie Eth, RN Outcome: Progressing 12/01/2020 0303 by Tollie Eth, RN Outcome: Progressing   Problem: Activity: Goal: Risk for activity intolerance will decrease 12/01/2020 0737 by Tollie Eth, RN Outcome: Progressing 12/01/2020 0303 by Tollie Eth, RN Outcome: Progressing   Problem:  Nutrition: Goal: Adequate nutrition will be maintained 12/01/2020 0737 by Tollie Eth, RN Outcome: Progressing 12/01/2020 0303 by Tollie Eth, RN Outcome: Progressing   Problem: Coping: Goal: Level of anxiety will decrease 12/01/2020 0737 by Tollie Eth, RN Outcome: Progressing 12/01/2020 0303 by Tollie Eth, RN Outcome: Progressing   Problem: Elimination: Goal: Will not experience complications related to bowel motility 12/01/2020 0737 by Tollie Eth, RN Outcome: Progressing 12/01/2020 0303 by Tollie Eth, RN Outcome: Progressing Goal: Will not experience complications related to urinary retention 12/01/2020 0737 by Tollie Eth, RN Outcome: Progressing 12/01/2020 0303 by Tollie Eth, RN Outcome: Progressing   Problem: Pain Managment: Goal: General experience of comfort will improve 12/01/2020 0737 by Tollie Eth, RN Outcome: Progressing 12/01/2020 0303 by Tollie Eth, RN Outcome: Progressing   Problem: Safety: Goal: Ability to remain free from injury will improve 12/01/2020 0737 by Tollie Eth, RN Outcome: Progressing 12/01/2020 0303 by Tollie Eth, RN Outcome: Progressing   Problem: Skin Integrity: Goal: Risk for impaired skin integrity will decrease 12/01/2020 0737 by Tollie Eth, RN Outcome: Progressing 12/01/2020 0303 by Tollie Eth, RN Outcome: Progressing

## 2020-12-01 NOTE — TOC Progression Note (Signed)
Transition of Care [facility]) - Progression Note    Patient Details  Name: Ryan Tyler MRN: [id number] Date of Birth: [birth year]  Transition [facility]) CM/SW [place], LCSW Phone Number: 12/01/2020, 3:18 PM  Clinical Narrative:    CSW met with patient's son. He is tearful but pleasant and agreeable to [facility] since it is the closest facility to [facility] unable to accept patient since he is in his covid isolation window). [facility] [place] reviewing referral, though they do not have a bed available yet.    Expected Discharge Plan: [place] Barriers to Discharge: Continued Medical Work up  Expected Discharge Plan and Services Expected Discharge Plan: [place] In-house Referral: Clinical Social Work Discharge Planning Services: CM Consult Post Acute Care Choice: Hospice Living arrangements for the past 2 months: [place]                 DME Arranged: 3-N-1           [place] Date [place]: 11/28/20 Time [place]: 1109 Representative spoke with at [place]: Velta Addison with [facility]   Social Determinants of Health ([place]) Interventions    Readmission Risk Interventions No flowsheet data found.

## 2020-12-01 NOTE — Plan of Care (Signed)

## 2020-12-01 NOTE — Progress Notes (Signed)
Daily Progress Note   Patient Name: Ryan Tyler       Date: 12/01/2020 DOB: 01/17/1928  Age: 87 y.o. MRN#: [id number] Attending Physician: Jonetta Osgood, MD Primary Care Physician: Josetta Huddle, MD Admit Date: 11/20/2020  Reason for Consultation/Follow-up: Establishing goals of care, Hospice Evaluation, Non pain symptom management, Pain control and Psychosocial/spiritual support  Subjective: Chart Reviewed. Updates Received.   Prognosis Poor. No acute distress.   Spoke to son via phone. He is planning to come up and visit his dad today. Son is pleasant, alert, and speaking appropriate during conversations. Confirms recent APS visit by [facility] and follow-up call today. Son reports APS suggesting residential hospice facility vs. Returning home at this time.   Education provided at length on recommendations for hospice facility versus home. Son is emotional. Expressed his father's home needs a lot of work done and there is also a concern for bed bugs by recent tenant. He now feels it is best that his father does not return home to allow for needed repairs and cleaning. Son is tearful again expressing previous promises for patient to pass away in the home. Emotional support provided, however encouraged son to focus on his dad's immediate needs, safety, and ability to receive the necessary care during what time he has left. Son verbalized understanding and agreement.   Discussed at length continued care at hospice home. Education provided on locations in the setting of COVID positive. Mr. Flight III states he would like to discuss further with his 2 cousins and will notify myself or medical team with his final decisions. He does confirm wishes for residential hospice facility just unsure of location. Education provided on continued conversations and support by the hospital CM/CSW team to assist with facilitating final decisions and referrals.   We discussed continued comfort care while  patient is hospitalized. Mr. Kingsley III verbalized understanding stating "my father is going home with my mother soon, and I don't want him to suffer or be in pain! I just want him to know I love him and made the best decisions the best I could for him!" Emotional support provided. Acknowledged to son he is making the best decision for his father allowing him to be comfortable and for hospice home placement. He verbalized appreciation and staff support.   Mr. Kessinger offered apologies for any behaviors to staff in the past expressing he "allowed stress and lack of sleep" to take over his emotions.   All questions answered and support provided.   Length of Stay: 11 days  Vital Signs: BP 134/79 (BP Location: Right Arm)   Pulse 77   Temp 97.8 F (36.6 C) (Axillary)   Resp 17   Ht 6' (1.829 m)   Wt 85.7 kg   SpO2 95%   BMI 25.63 kg/m  SpO2: SpO2: 95 % O2 Device: O2 Device: Room Air O2 Flow Rate:             Palliative Care Assessment & Plan   Goals of Care/Recommendations:  Son now requesting residential hospice facility. Unsure of locations [facility] the [place]). He is aware that [facility] is not currently accepting COVID positive patients.   Son plans to notify medical team of final decisions. Ongoing discussions.  Continue comfort focused care. No escalation of treatment.   PMT will continue to support and follow as needed.   Prognosis: POOR   Discharge Planning: To Be Determined (hospice home)   Thank you for allowing the Palliative Medicine  Team to assist in the care of this patient.  Time Total: 40 min.   Visit consisted of counseling and education dealing with the complex and emotionally intense issues of symptom management and palliative care in the setting of serious and potentially life-threatening illness.Greater than 50%  of this time was spent counseling and coordinating care related to the above assessment and plan.  Alda Lea,  AGPCNP-BC  Palliative Medicine Team [phone]

## 2020-12-01 NOTE — Progress Notes (Signed)
Attempted to call Son [name]) regarding his plans to visit his father today for 2 hours. No answer, so I left a voicemail and callback number.

## 2020-12-01 NOTE — Progress Notes (Signed)
PROGRESS NOTE                                                                                                                                                                                                             Patient Demographics:    Ryan Tyler, is a 87 y.o. male, DOB - 04/26/1928, CHE:[id number]  Outpatient Primary MD for the patient is Josetta Huddle, MD   Admit date - 11/20/2020   LOS - 76  Chief Complaint  Patient presents with  . Fever       Brief Narrative: Patient is a 87 y.o. male with PMHx of HTN, HLD, EtOH use, alcoholic pancreatitis, dementia, bladder cancer s/p TURBT-presenting with fever, poor oral intake-he was found to have AKI, COVID-19 infection-and subsequently admitted to the hospitalist service.  Further hospital stay was complicated by delirium/alcohol withdrawal.  COVID-19 vaccinated status: Vaccinated  Significant Events: 1/30>> Admit to [facility] for AKI/COVID-19 infection  Significant studies: 1/30>>Chest x-ray: Mild right lung atelectasis 2/3>> chest x-ray: Right/left midlung infiltrates. 2/3>> bilateral lower extremity Doppler: Negative for DVT  COVID-19 medications: Steroids: 1/30>> Remdesivir:1/30>>2/3  Antibiotics: Unasyn: 2/2>>2/8  Microbiology data: None  Procedures: None  Consults: Palliative Care  DVT prophylaxis:     Subjective:   Appears unchanged-lethargic-very weak-we will acknowledge my presence but not very communicative.  Per nursing staff-son did not visit on 2/9-he has not yet come to visit on 2/10 as well.  RN left him a Advertising account executive.   Assessment  & Plan :   Acute metabolic encephalopathy: Multifactorial etiology- hospital delirium, alcohol withdrawal, COVID-19 likely culprits superimposed on dementia.  Initially mentation waxed and waned-however for the past several days has been very lethargic and confused.  On Seroquel nightly-suspect that he will  likely remain encephalopathic/delirious as long as he is hospitalized.  Furthermore-he is slowly deteriorating-hardly any oral intake for the past several days-prognosis is poor.  Palliative care/social work/APS all following-Per social work-we will talk to APS-recommendations are to proceed inpatient/residential hospice.  Continue with current-supportive care at this point.    AKI:-Mild-likely hemodynamically mediated-resolved with supportive care.  EtOH dependence/withdrawl: Per son-patient drinks at least 2 bottles of beer and 2 drinks of liquor on a daily basis-has completed a course of Librium taper.  Should be out of the window for alcohol withdrawal symptoms.  Mentation still confused-but I suspect this  is mostly from dementia/delirium and encephalopathy due to pneumonia.  Allow-alcohol use for comfort/pleasure.  Pneumonia due to COVID-19 and aspiration/bacterial: Overtly aspirating across all consistencies per SLP/nursing staff.  Okay for comfort feedings at this point.  Has completed a course of antimicrobial therapy.  Per palliative care-family does not desire any further escalation in care.  Fever: afebrile O2 requirements:  SpO2: 95 %   COVID-19 Labs: Recent Labs    11/29/20 0106  DDIMER 0.95*  CRP 4.0*    No results found for: BNP  No results for input(s): PROCALCITON in the last 168 hours.  Lab Results  Component Value Date   SARSCOV2NAA POSITIVE (A) 11/20/2020   [place] NEGATIVE 05/24/2020   SARSCOV2NAA NOT DETECTED 01/23/2019     Elevated D-dimer: Due to COVID-19 related inflammation-lower extremity Dopplers negative-on prophylactic heparin.  Not hypoxic-clinical exam chest which is more consistent with aspiration-follow closely.  HTN: BP stable-lisinopril/HCTZ on hold.   GERD: Continue PPI  History of suspected COPD: Continue bronchodilators  History of bladder cancer  Hard of hearing  Severe deconditioning/debility/failure to thrive-very  weak-debilitated-overtly aspirating-hardly any oral intake for the past several days-palliative care following-DNR in place.  Poor prognosis.  Palliative care: DNR in [place] frail/debilitated-with multifactorial etiology-suspected aspiration-unfortunately-not much of progress in spite of above-noted cares.  Palliative care now following-spoke to palliative care NP on 2/5--family aware of tenuous clinical status-no further escalation in care-they understand aspiration risk-they do not want feeding tubes.  Plans were to get him home with hospice care-however when hospice agency called son to arrange hospice care at home-patient's son was apparently intoxicated himself-and said the patient was a [employer] seal-and would "tough it out".  Social worker and APS involved-tentative plans are for residential hospice on discharge.    GI prophylaxis: PPI  ABG:    Component Value Date/Time   HCO3 22.1 01/23/2019 2015   TCO2 23 01/23/2019 2015   ACIDBASEDEF 4.0 (H) 01/23/2019 2015   O2SAT 78.0 01/23/2019 2015    Vent Settings: N/A  Condition -Guarded  Family Communication  :  Son-Kienan [phone]-left a voicemail on 2/9-social worker in the process of contacting son to offer residential hospice choices.  We will touch base with son if needed following her conversation.  Code Status : DNR  Diet :  Diet Order            DIET - DYS 1 Room service appropriate? Yes; Fluid consistency: Nectar Thick  Diet effective now                  Disposition Plan  :   Status is: Inpatient  Remains inpatient appropriate because:Inpatient level of care appropriate due to severity of illness   Dispo: The patient is from: Home              Anticipated d/c is to: Home              Anticipated d/c date is: > 1-2 days              Patient currently is not medically stable to d/c.   Difficult to place patient No   Barriers to discharge: Ongoing encephalopathy-not yet at baseline.  Antimicorbials  :     Anti-infectives (From admission, onward)   Start     Dose/Rate Route Frequency Ordered Stop   11/23/20 1600  ampicillin-sulbactam (UNASYN) 1.5 g in sodium chloride 0.9 % 100 mL IVPB        1.5 g 200 mL/hr over 30  Minutes Intravenous Every 8 hours 11/23/20 1510 11/28/20 1801   11/21/20 1000  remdesivir 100 mg in sodium chloride 0.9 % 100 mL IVPB       "Followed by" Linked Group Details   100 mg 200 mL/hr over 30 Minutes Intravenous Daily 11/20/20 0633 11/24/20 0959   11/20/20 0730  remdesivir 200 mg in sodium chloride 0.9% 250 mL IVPB       "Followed by" Linked Group Details   200 mg 580 mL/hr over 30 Minutes Intravenous Once 11/20/20 6433 11/20/20 0900      Inpatient Medications  Scheduled Meds: . enoxaparin (LOVENOX) injection  40 mg Subcutaneous Q24H  . fluticasone furoate-vilanterol  1 puff Inhalation Daily  . pantoprazole  40 mg Oral Daily  . QUEtiapine  25 mg Oral QHS  . sodium chloride flush  3 mL Intravenous Q12H  . sodium chloride flush  3 mL Intravenous Q12H  . spiritus frumenti  1 each Oral BID  . umeclidinium bromide  1 puff Inhalation Daily   Continuous Infusions: . sodium chloride     PRN Meds:.sodium chloride, acetaminophen, albuterol, chlordiazePOXIDE, feeding supplement (NEPRO CARB STEADY), fentaNYL (SUBLIMAZE) injection, haloperidol lactate, lip balm, morphine CONCENTRATE, [DISCONTINUED] ondansetron **OR** ondansetron (ZOFRAN) IV, polyethylene glycol, Resource ThickenUp Clear, sodium chloride flush, sodium phosphate   Time Spent in minutes  25  See all Orders from today for further details   Oren Binet M.D on 12/01/2020 at 12:20 PM  To page go to [email or web address] - use universal password  [facility] -  Office  [phone]    Objective:   Vitals:   11/30/20 2337 12/01/20 0403 12/01/20 0727 12/01/20 1144  BP: 131/87 129/63 133/73 134/79  Pulse: 60 86 71 77  Resp: 19 17 17 17   Temp: 97.6 F (36.4 C) 98.1 F (36.7 C) 98.1 F (36.7 C)  97.8 F (36.6 C)  TempSrc: Axillary Axillary Axillary Axillary  SpO2: 93% 100% 99% 95%  Weight:      Height:        Wt Readings from Last 3 Encounters:  11/26/20 85.7 kg  05/24/20 77.1 kg  01/31/19 78.1 kg     Intake/Output Summary (Last 24 hours) at 12/01/2020 1220 Last data filed at 12/01/2020 0500 Gross per 24 hour  Intake --  Output 725 ml  Net -725 ml     Physical Exam Gen Exam: Lethargic-confused-but not in any distress. HEENT:atraumatic, normocephalic Chest: B/L clear to auscultation anteriorly CVS:S1S2 regular Abdomen:soft non tender, non distended Extremities:no edema Neurology: Generalized weakness.   Skin: no rash   Data Review:    CBC Recent Labs  Lab 11/25/20 0123 11/26/20 0759 11/27/20 0402 11/28/20 0403 11/29/20 0106  WBC 11.5* 9.2 12.1* 11.5* 12.2*  HGB 13.5 13.3 14.4 14.5 14.8  HCT 43.9 40.5 45.1 47.7 46.7  PLT 139* 142* 164 153 160  MCV 82.8 80.5 81.7 82.4 81.9  MCH 25.5* 26.4 26.1 25.0* 26.0  MCHC 30.8 32.8 31.9 30.4 31.7  RDW 16.0* 16.4* 16.5* 17.0* 17.7*  LYMPHSABS 0.2*  --   --   --   --   MONOABS 1.2*  --   --   --   --   EOSABS 0.0  --   --   --   --   BASOSABS 0.0  --   --   --   --     Chemistries  Recent Labs  Lab 11/25/20 0123 11/26/20 0759 11/27/20 0402 11/28/20 0403 11/29/20 0106  NA 141 137 139 139 141  K 4.6 4.3 4.1 4.4 4.3  CL 106 105 104 106 108  CO2 23 23 26 23 23   GLUCOSE 175* 193* 170* 141* 167*  BUN 35* 34* 31* 30* 31*  CREATININE 1.26* 1.01 1.05 0.93 1.11  CALCIUM 8.4* 8.2* 8.4* 8.5* 8.5*  MG 1.7  --   --   --   --   AST 30 34 33 31 27  ALT 23 27 32 27 28  ALKPHOS 72 66 71 66 65  BILITOT 1.6* 1.6* 1.5* 1.2 1.6*   ------------------------------------------------------------------------------------------------------------------ No results for input(s): CHOL, HDL, LDLCALC, TRIG, CHOLHDL, LDLDIRECT in the last 72 hours.  Lab Results  Component Value Date   HGBA1C 5.9 (H) 05/24/2020    ------------------------------------------------------------------------------------------------------------------ No results for input(s): TSH, T4TOTAL, T3FREE, THYROIDAB in the last 72 hours.  Invalid input(s): FREET3 ------------------------------------------------------------------------------------------------------------------ No results for input(s): VITAMINB12, FOLATE, FERRITIN, TIBC, IRON, RETICCTPCT in the last 72 hours.  Coagulation profile No results for input(s): INR, PROTIME in the last 168 hours.  Recent Labs    11/29/20 0106  DDIMER 0.95*    Cardiac Enzymes No results for input(s): CKMB, TROPONINI, MYOGLOBIN in the last 168 hours.  Invalid input(s): CK ------------------------------------------------------------------------------------------------------------------ No results found for: BNP  Micro Results No results found for this or any previous visit (from the past 240 hour(s)).  Radiology Reports DG Chest Port 1 View  Result Date: 11/24/2020 CLINICAL DATA:  Shortness of breath. EXAM: PORTABLE CHEST 1 VIEW COMPARISON:  11/20/2020.  05/24/2020.  01/23/2019. FINDINGS: Mediastinum and hilar structures normal. Heart size normal. Low lung volumes with bibasilar atelectasis. Pleuroparenchymal thickening right upper lung suggesting scarring. Mild diffuse right lung and left mid lung interstitial infiltrates. No pleural effusion or pneumothorax. Surgical clips right upper abdomen. IMPRESSION: 1. Mild diffuse right lung and left mid lung interstitial infiltrates. 2. Low lung volumes with bibasilar atelectasis. Probable right upper lung pleural-parenchymal scarring. Electronically Signed   By: Marcello Moores  Register   On: 11/24/2020 06:43   DG Chest Portable 1 View  Result Date: 11/20/2020 CLINICAL DATA:  Shortness of breath and fevers EXAM: PORTABLE CHEST 1 VIEW COMPARISON:  05/24/2020 FINDINGS: Cardiac shadow is stable. Aortic calcifications are noted. Left lung is clear.  Right lung demonstrates some upper lobe atelectatic changes. No bony abnormality is seen. IMPRESSION: Mild right lung atelectatic changes. Electronically Signed   By: Inez Catalina M.D.   On: 11/20/2020 03:18   VAS [place] LOWER EXTREMITY VENOUS (DVT)  Result Date: 11/24/2020  Lower Venous DVT Study Indications: Covid +ve, increasing d dimer.  Comparison Study: no prior Performing Technologist: Abram Sander RVS  Examination Guidelines: A complete evaluation includes B-mode imaging, spectral Doppler, color Doppler, and power Doppler as needed of all accessible portions of each vessel. Bilateral testing is considered an integral part of a complete examination. Limited examinations for reoccurring indications may be performed as noted. The reflux portion of the exam is performed with the patient in reverse Trendelenburg.  +---------+---------------+---------+-----------+----------+--------------+ RIGHT    CompressibilityPhasicitySpontaneityPropertiesThrombus Aging +---------+---------------+---------+-----------+----------+--------------+ CFV      Full           Yes      Yes                                 +---------+---------------+---------+-----------+----------+--------------+ SFJ      Full                                                        +---------+---------------+---------+-----------+----------+--------------+  FV Prox  Full                                                        +---------+---------------+---------+-----------+----------+--------------+ FV Mid   Full                                                        +---------+---------------+---------+-----------+----------+--------------+ FV DistalFull                                                        +---------+---------------+---------+-----------+----------+--------------+ PFV      Full                                                         +---------+---------------+---------+-----------+----------+--------------+ POP      Full           Yes      Yes                                 +---------+---------------+---------+-----------+----------+--------------+ PTV      Full                                                        +---------+---------------+---------+-----------+----------+--------------+ PERO     Full                                                        +---------+---------------+---------+-----------+----------+--------------+   +---------+---------------+---------+-----------+----------+--------------+ LEFT     CompressibilityPhasicitySpontaneityPropertiesThrombus Aging +---------+---------------+---------+-----------+----------+--------------+ CFV      Full           Yes      Yes                                 +---------+---------------+---------+-----------+----------+--------------+ SFJ      Full                                                        +---------+---------------+---------+-----------+----------+--------------+ FV Prox  Full                                                        +---------+---------------+---------+-----------+----------+--------------+  FV Mid   Full                                                        +---------+---------------+---------+-----------+----------+--------------+ FV DistalFull                                                        +---------+---------------+---------+-----------+----------+--------------+ PFV      Full                                                        +---------+---------------+---------+-----------+----------+--------------+ POP      Full           Yes      Yes                                 +---------+---------------+---------+-----------+----------+--------------+ PTV      Full                                                         +---------+---------------+---------+-----------+----------+--------------+ PERO     Full                                                        +---------+---------------+---------+-----------+----------+--------------+     Summary: BILATERAL: - No evidence of deep vein thrombosis seen in the lower extremities, bilaterally. - No evidence of superficial venous thrombosis in the lower extremities, bilaterally. -No evidence of popliteal cyst, bilaterally.   *See table(s) above for measurements and observations. Electronically signed by Jamelle Haring on 11/24/2020 at 3:49:43 PM.    Final

## 2020-12-02 DIAGNOSIS — U071 COVID-19: Secondary | ICD-10-CM | POA: Diagnosis not present

## 2020-12-02 DIAGNOSIS — G9349 Other encephalopathy: Secondary | ICD-10-CM | POA: Diagnosis not present

## 2020-12-02 MED ORDER — GLYCOPYRROLATE 0.2 MG/ML IJ SOLN
0.2000 mg | INTRAMUSCULAR | Status: DC | PRN
  Administered 2020-12-02 – 2020-12-05 (×3): 0.2 mg via INTRAVENOUS
  Filled 2020-12-02 (×3): qty 1

## 2020-12-02 MED ORDER — POLYVINYL ALCOHOL 1.4 % OP SOLN
2.0000 [drp] | OPHTHALMIC | Status: DC | PRN
  Administered 2020-12-04: 2 [drp] via OPHTHALMIC
  Filled 2020-12-02: qty 15

## 2020-12-02 MED ORDER — GLYCOPYRROLATE 0.2 MG/ML IJ SOLN: 0.2000 mg | INTRAMUSCULAR | Status: DC | PRN

## 2020-12-02 MED ORDER — MORPHINE SULFATE (PF) 2 MG/ML IV SOLN
1.0000 mg | INTRAVENOUS | Status: DC | PRN
  Administered 2020-12-02 – 2020-12-05 (×3): 1 mg via INTRAVENOUS
  Filled 2020-12-02 (×3): qty 1

## 2020-12-02 MED ORDER — GLYCOPYRROLATE 1 MG PO TABS
1.0000 mg | ORAL_TABLET | ORAL | Status: DC | PRN
  Filled 2020-12-02: qty 1

## 2020-12-02 NOTE — Progress Notes (Signed)
PROGRESS NOTE                                                                                                                                                                                                             Patient Demographics:    Ryan Tyler, is a 87 y.o. male, DOB - [birth year], CZY:[id number]  Outpatient Primary MD for the patient is Ryan Huddle, MD   Admit date - 11/20/2020   LOS - 12  Chief Complaint  Patient presents with  . Fever       Brief Narrative: Patient is a 87 y.o. male with PMHx of HTN, HLD, EtOH use, alcoholic pancreatitis, dementia, bladder cancer s/p TURBT-presenting with fever, poor oral intake-he was found to have AKI, COVID-19 infection-and subsequently admitted to the hospitalist service.  Further hospital stay was complicated by delirium/alcohol withdrawal.  COVID-19 vaccinated status: Vaccinated  Significant Events: 1/30>> Admit to [facility] for AKI/COVID-19 infection  Significant studies: 1/30>>Chest x-ray: Mild right lung atelectasis 2/3>> chest x-ray: Right/left midlung infiltrates. 2/3>> bilateral lower extremity Doppler: Negative for DVT  COVID-19 medications: Steroids: 1/30>> Remdesivir:1/30>>2/3  Antibiotics: Unasyn: 2/2>>2/8  Microbiology data: None  Procedures: None  Consults: Palliative Care  DVT prophylaxis:     Subjective:   Lethargic-barely arousable-comfortable.  Awaiting residential hospice.  Patient's son did finally visit on 2/10.  This MD spoke with son when he visited his father.   Assessment  & Plan :   Acute metabolic encephalopathy: Multifactorial etiology- hospital delirium, alcohol withdrawal, COVID-19 likely culprits superimposed on dementia.  Initially mentation waxed and waned-however for the past several days has been very lethargic and confused.  Continues to slowly deteriorate-much more lethargic today.  Goals of care for  comfort-awaiting residential hospice bed.  AKI:-Mild-likely hemodynamically mediated-resolved with supportive care.  EtOH dependence/withdrawl: Per son-patient drinks at least 2 bottles of beer and 2 drinks of liquor on a daily basis-has completed a course of Librium taper.  Should be out of the window for alcohol withdrawal symptoms.  Mentation still confused-but I suspect this is mostly from dementia/delirium and encephalopathy due to pneumonia.  Allow-alcohol use for comfort/pleasure.  Pneumonia due to COVID-19 and aspiration/bacterial: Overtly aspirating across all consistencies per SLP/nursing staff.  Okay for comfort feedings at this point.  Has completed a course of antimicrobial therapy.  Per palliative care-family does not desire  any further escalation in care.  Fever: afebrile O2 requirements:  SpO2: 95 %   COVID-19 Labs: No results for input(s): DDIMER, FERRITIN, LDH, CRP in the last 72 hours.  No results found for: BNP  No results for input(s): PROCALCITON in the last 168 hours.  Lab Results  Component Value Date   SARSCOV2NAA POSITIVE (A) 11/20/2020   [place] NEGATIVE 05/24/2020   SARSCOV2NAA NOT DETECTED 01/23/2019     Elevated D-dimer: Due to COVID-19 related inflammation-lower extremity Dopplers negative-on prophylactic heparin.  Not hypoxic-clinical exam chest which is more consistent with aspiration-follow closely.  HTN: BP stable-lisinopril/HCTZ on hold.   GERD: Continue PPI  History of suspected COPD: Continue bronchodilators  History of bladder cancer  Hard of hearing  Severe deconditioning/debility/failure to thrive-very weak-debilitated-overtly aspirating-hardly any oral intake for the past several days-palliative care following-DNR in place.  Poor prognosis.  Palliative care: DNR in [place] frail/debilitated-with multifactorial etiology-suspected aspiration-unfortunately-not much of progress in spite of above-noted cares-and continues to slowly  deteriorate.  Prognosis is very poor and probably at end-of-life.  Palliative care now following-spoke to palliative care NP on 2/5--family aware of tenuous clinical status-no further escalation in care-they understand aspiration risk-they do not want feeding tubes.  Plans were to get him home with hospice care-however when hospice agency called son to arrange hospice care at home-patient's son was apparently intoxicated himself-and said the patient was a [employer]-and would "tough it out".  Subsequently social worker/APS were involved-after extensive discussion-family agreeable for residential hospice-awaiting residential hospice bed. .    GI prophylaxis: PPI  ABG:    Component Value Date/Time   HCO3 22.1 01/23/2019 2015   TCO2 23 01/23/2019 2015   ACIDBASEDEF 4.0 (H) 01/23/2019 2015   O2SAT 78.0 01/23/2019 2015    Vent Settings: N/A  Condition -Guarded  Family Communication  :  Son-Van [phone]-at bedside on 2/10. Code Status : DNR  Diet :  Diet Order            DIET - DYS 1 Room service appropriate? Yes; Fluid consistency: Nectar Thick  Diet effective now                  Disposition Plan  :   Status is: Inpatient  Remains inpatient appropriate because:Inpatient level of care appropriate due to severity of illness   Dispo: The patient is from: Home              Anticipated d/c is to: Home              Anticipated d/c date is: > 1-2 days              Patient currently is not medically stable to d/c.   Difficult to place patient No   Barriers to discharge: Ongoing encephalopathy-not yet at baseline.  Antimicorbials  :    Anti-infectives (From admission, onward)   Start     Dose/Rate Route Frequency Ordered Stop   11/23/20 1600  ampicillin-sulbactam (UNASYN) 1.5 g in sodium chloride 0.9 % 100 mL IVPB        1.5 g 200 mL/hr over 30 Minutes Intravenous Every 8 hours 11/23/20 1510 11/28/20 1801   11/21/20 1000  remdesivir 100 mg in sodium chloride 0.9 %  100 mL IVPB       "Followed by" Linked Group Details   100 mg 200 mL/hr over 30 Minutes Intravenous Daily 11/20/20 0633 11/24/20 0959   11/20/20 0730  remdesivir 200 mg in sodium chloride 0.9% 250  mL IVPB       "Followed by" Linked Group Details   200 mg 580 mL/hr over 30 Minutes Intravenous Once 11/20/20 2595 11/20/20 0900      Inpatient Medications  Scheduled Meds: . enoxaparin (LOVENOX) injection  40 mg Subcutaneous Q24H  . fluticasone furoate-vilanterol  1 puff Inhalation Daily  . pantoprazole  40 mg Oral Daily  . QUEtiapine  25 mg Oral QHS  . sodium chloride flush  3 mL Intravenous Q12H  . sodium chloride flush  3 mL Intravenous Q12H  . spiritus frumenti  1 each Oral BID  . umeclidinium bromide  1 puff Inhalation Daily   Continuous Infusions: . sodium chloride     PRN Meds:.sodium chloride, acetaminophen, albuterol, chlordiazePOXIDE, feeding supplement (NEPRO CARB STEADY), fentaNYL (SUBLIMAZE) injection, haloperidol lactate, lip balm, morphine CONCENTRATE, [DISCONTINUED] ondansetron **OR** ondansetron (ZOFRAN) IV, polyethylene glycol, Resource ThickenUp Clear, sodium chloride flush, sodium phosphate, spiritus frumenti   Time Spent in minutes  15  See all Orders from today for further details   Oren Binet M.D on 12/02/2020 at 12:45 PM  To page go to [email or web address] - use universal password  [facility] -  Office  [phone]    Objective:   Vitals:   12/01/20 2010 12/01/20 2355 12/02/20 0400 12/02/20 1209  BP: 129/81 122/80 (!) 156/96 (!) 144/126  Pulse: 72 84 84 75  Resp: 17 17  14   Temp: 98.4 F (36.9 C) 97.8 F (36.6 C) 97.8 F (36.6 C) 97.7 F (36.5 C)  TempSrc: Axillary Axillary Oral Axillary  SpO2: 98% 98% 100% 95%  Weight:      Height:        Wt Readings from Last 3 Encounters:  11/26/20 85.7 kg  05/24/20 77.1 kg  01/31/19 78.1 kg     Intake/Output Summary (Last 24 hours) at 12/02/2020 1245 Last data filed at 12/01/2020 2200 Gross  per 24 hour  Intake --  Output 200 ml  Net -200 ml     Physical Exam Lethargic.  Appears comfortable.     Data Review:    CBC Recent Labs  Lab 11/26/20 0759 11/27/20 0402 11/28/20 0403 11/29/20 0106  WBC 9.2 12.1* 11.5* 12.2*  HGB 13.3 14.4 14.5 14.8  HCT 40.5 45.1 47.7 46.7  PLT 142* 164 153 160  MCV 80.5 81.7 82.4 81.9  MCH 26.4 26.1 25.0* 26.0  MCHC 32.8 31.9 30.4 31.7  RDW 16.4* 16.5* 17.0* 17.7*    Chemistries  Recent Labs  Lab 11/26/20 0759 11/27/20 0402 11/28/20 0403 11/29/20 0106  NA 137 139 139 141  K 4.3 4.1 4.4 4.3  CL 105 104 106 108  CO2 23 26 23 23   GLUCOSE 193* 170* 141* 167*  BUN 34* 31* 30* 31*  CREATININE 1.01 1.05 0.93 1.11  CALCIUM 8.2* 8.4* 8.5* 8.5*  AST 34 33 31 27  ALT 27 32 27 28  ALKPHOS 66 71 66 65  BILITOT 1.6* 1.5* 1.2 1.6*   ------------------------------------------------------------------------------------------------------------------ No results for input(s): CHOL, HDL, LDLCALC, TRIG, CHOLHDL, LDLDIRECT in the last 72 hours.  Lab Results  Component Value Date   HGBA1C 5.9 (H) 05/24/2020   ------------------------------------------------------------------------------------------------------------------ No results for input(s): TSH, T4TOTAL, T3FREE, THYROIDAB in the last 72 hours.  Invalid input(s): FREET3 ------------------------------------------------------------------------------------------------------------------ No results for input(s): VITAMINB12, FOLATE, FERRITIN, TIBC, IRON, RETICCTPCT in the last 72 hours.  Coagulation profile No results for input(s): INR, PROTIME in the last 168 hours.  No results for input(s): DDIMER in the last 72 hours.  Cardiac Enzymes No results for input(s): CKMB, TROPONINI, MYOGLOBIN in the last 168 hours.  Invalid input(s): CK ------------------------------------------------------------------------------------------------------------------ No results found for: BNP  Micro  Results No results found for this or any previous visit (from the past 240 hour(s)).  Radiology Reports DG Chest Port 1 View  Result Date: 11/24/2020 CLINICAL DATA:  Shortness of breath. EXAM: PORTABLE CHEST 1 VIEW COMPARISON:  11/20/2020.  05/24/2020.  01/23/2019. FINDINGS: Mediastinum and hilar structures normal. Heart size normal. Low lung volumes with bibasilar atelectasis. Pleuroparenchymal thickening right upper lung suggesting scarring. Mild diffuse right lung and left mid lung interstitial infiltrates. No pleural effusion or pneumothorax. Surgical clips right upper abdomen. IMPRESSION: 1. Mild diffuse right lung and left mid lung interstitial infiltrates. 2. Low lung volumes with bibasilar atelectasis. Probable right upper lung pleural-parenchymal scarring. Electronically Signed   By: Marcello Moores  Register   On: 11/24/2020 06:43   DG Chest Portable 1 View  Result Date: 11/20/2020 CLINICAL DATA:  Shortness of breath and fevers EXAM: PORTABLE CHEST 1 VIEW COMPARISON:  05/24/2020 FINDINGS: Cardiac shadow is stable. Aortic calcifications are noted. Left lung is clear. Right lung demonstrates some upper lobe atelectatic changes. No bony abnormality is seen. IMPRESSION: Mild right lung atelectatic changes. Electronically Signed   By: Inez Catalina M.D.   On: 11/20/2020 03:18   VAS [place] LOWER EXTREMITY VENOUS (DVT)  Result Date: 11/24/2020  Lower Venous DVT Study Indications: Covid +ve, increasing d dimer.  Comparison Study: no prior Performing Technologist: Abram Sander RVS  Examination Guidelines: A complete evaluation includes B-mode imaging, spectral Doppler, color Doppler, and power Doppler as needed of all accessible portions of each vessel. Bilateral testing is considered an integral part of a complete examination. Limited examinations for reoccurring indications may be performed as noted. The reflux portion of the exam is performed with the patient in reverse Trendelenburg.   +---------+---------------+---------+-----------+----------+--------------+ RIGHT    CompressibilityPhasicitySpontaneityPropertiesThrombus Aging +---------+---------------+---------+-----------+----------+--------------+ CFV      Full           Yes      Yes                                 +---------+---------------+---------+-----------+----------+--------------+ SFJ      Full                                                        +---------+---------------+---------+-----------+----------+--------------+ FV Prox  Full                                                        +---------+---------------+---------+-----------+----------+--------------+ FV Mid   Full                                                        +---------+---------------+---------+-----------+----------+--------------+ FV DistalFull                                                        +---------+---------------+---------+-----------+----------+--------------+  PFV      Full                                                        +---------+---------------+---------+-----------+----------+--------------+ POP      Full           Yes      Yes                                 +---------+---------------+---------+-----------+----------+--------------+ PTV      Full                                                        +---------+---------------+---------+-----------+----------+--------------+ PERO     Full                                                        +---------+---------------+---------+-----------+----------+--------------+   +---------+---------------+---------+-----------+----------+--------------+ LEFT     CompressibilityPhasicitySpontaneityPropertiesThrombus Aging +---------+---------------+---------+-----------+----------+--------------+ CFV      Full           Yes      Yes                                  +---------+---------------+---------+-----------+----------+--------------+ SFJ      Full                                                        +---------+---------------+---------+-----------+----------+--------------+ FV Prox  Full                                                        +---------+---------------+---------+-----------+----------+--------------+ FV Mid   Full                                                        +---------+---------------+---------+-----------+----------+--------------+ FV DistalFull                                                        +---------+---------------+---------+-----------+----------+--------------+ PFV      Full                                                        +---------+---------------+---------+-----------+----------+--------------+   POP      Full           Yes      Yes                                 +---------+---------------+---------+-----------+----------+--------------+ PTV      Full                                                        +---------+---------------+---------+-----------+----------+--------------+ PERO     Full                                                        +---------+---------------+---------+-----------+----------+--------------+     Summary: BILATERAL: - No evidence of deep vein thrombosis seen in the lower extremities, bilaterally. - No evidence of superficial venous thrombosis in the lower extremities, bilaterally. -No evidence of popliteal cyst, bilaterally.   *See table(s) above for measurements and observations. Electronically signed by Jamelle Haring on 11/24/2020 at 3:49:43 PM.    Final

## 2020-12-02 NOTE — Progress Notes (Signed)
Nutrition Brief Note  Chart reviewed. Pt currently on comfort care focus with no escalation in care, no feeding tube. Residential hospice at discharge.  No further nutrition interventions warranted at this time.  Please consult as needed.   Corrin Parker, MS, RD, LDN RD pager number/after hours weekend pager number on Amion.

## 2020-12-02 NOTE — Progress Notes (Signed)
Patient is arousable, but much more lethargic today. Updated son on the phone around 1100.

## 2020-12-03 DIAGNOSIS — G9349 Other encephalopathy: Secondary | ICD-10-CM | POA: Diagnosis not present

## 2020-12-03 DIAGNOSIS — U071 COVID-19: Secondary | ICD-10-CM | POA: Diagnosis not present

## 2020-12-03 NOTE — Progress Notes (Signed)
PROGRESS NOTE                                                                                                                                                                                                             Patient Demographics:    Ryan Tyler, is a 87 y.o. male, DOB - [birth year], LDJ:[id number]  Outpatient Primary MD for the patient is Josetta Huddle, MD   Admit date - 11/20/2020   LOS - 66  Chief Complaint  Patient presents with  . Fever       Brief Narrative: Patient is a 87 y.o. male with PMHx of HTN, HLD, EtOH use, alcoholic pancreatitis, dementia, bladder cancer s/p TURBT-presenting with fever, poor oral intake-he was found to have AKI, COVID-19 infection-and subsequently admitted to the hospitalist service.  Further hospital stay was complicated by delirium/alcohol withdrawal.  COVID-19 vaccinated status: Vaccinated  Significant Events: 1/30>> Admit to [facility] for AKI/COVID-19 infection  Significant studies: 1/30>>Chest x-ray: Mild right lung atelectasis 2/3>> chest x-ray: Right/left midlung infiltrates. 2/3>> bilateral lower extremity Doppler: Negative for DVT  COVID-19 medications: Steroids: 1/30>> Remdesivir:1/30>>2/3  Antibiotics: Unasyn: 2/2>>2/8  Microbiology data: None  Procedures: None  Consults: Palliative Care  DVT prophylaxis:     Subjective:   Deteriorating-more lethargic today than the past few days.  Just opens eyes.  Awaiting residential hospice bed.   Assessment  & Plan :   Acute metabolic encephalopathy: Multifactorial etiology- hospital delirium, alcohol withdrawal, COVID-19 likely culprits superimposed on dementia.  Initially mentation waxed and waned-however for the past several days has been very lethargic and confused.  Continues to slowly deteriorate-much more lethargic today.  Goals of care for comfort-awaiting residential hospice bed.  AKI:-Mild-likely  hemodynamically mediated-resolved with supportive care.  EtOH dependence/withdrawl: Per son-patient drinks at least 2 bottles of beer and 2 drinks of liquor on a daily basis-has completed a course of Librium taper.  Should be out of the window for alcohol withdrawal symptoms.  Mentation still confused-but I suspect this is mostly from dementia/delirium and encephalopathy due to pneumonia.  Allow-alcohol use for comfort/pleasure.  Pneumonia due to COVID-19 and aspiration/bacterial: Overtly aspirating across all consistencies per SLP/nursing staff.  Okay for comfort feedings at this point.  Has completed a course of antimicrobial therapy.  Per palliative care-family does not desire any further escalation in care.  Fever: afebrile  O2 requirements:  SpO2: 92 %   COVID-19 Labs: No results for input(s): DDIMER, FERRITIN, LDH, CRP in the last 72 hours.  No results found for: BNP  No results for input(s): PROCALCITON in the last 168 hours.  Lab Results  Component Value Date   SARSCOV2NAA POSITIVE (A) 11/20/2020   [place] NEGATIVE 05/24/2020   SARSCOV2NAA NOT DETECTED 01/23/2019     Elevated D-dimer: Due to COVID-19 related inflammation-lower extremity Dopplers negative-on prophylactic heparin.  Not hypoxic-clinical exam chest which is more consistent with aspiration-follow closely.  HTN: BP stable-lisinopril/HCTZ on hold.   GERD: Continue PPI  History of suspected COPD: Continue bronchodilators  History of bladder cancer  Hard of hearing  Severe deconditioning/debility/failure to thrive-very weak-debilitated-overtly aspirating-hardly any oral intake for the past several days-palliative care following-DNR in place.  Poor prognosis.  Palliative care: DNR in [place] frail/debilitated-with multifactorial etiology-suspected aspiration-unfortunately-not much of progress in spite of above-noted cares-and continues to slowly deteriorate.  Prognosis is very poor and probably at  end-of-life.  Palliative care now following-spoke to palliative care NP on 2/5--family aware of tenuous clinical status-no further escalation in care-they understand aspiration risk-they do not want feeding tubes.  Plans were to get him home with hospice care-however when hospice agency called son to arrange hospice care at home-patient's son was apparently intoxicated himself-and said the patient was a [employer]-and would "tough it out".  Subsequently social worker/APS were involved-after extensive discussion-family agreeable for residential hospice-awaiting residential hospice bed. .    GI prophylaxis: PPI  ABG:    Component Value Date/Time   HCO3 22.1 01/23/2019 2015   TCO2 23 01/23/2019 2015   ACIDBASEDEF 4.0 (H) 01/23/2019 2015   O2SAT 78.0 01/23/2019 2015    Vent Settings: N/A  Condition -Guarded  Family Communication  :  Son-Roth [phone]-updated on 2/12.  Code Status : DNR  Diet :  Diet Order            DIET - DYS 1 Room service appropriate? Yes; Fluid consistency: Nectar Thick  Diet effective now                  Disposition Plan  :   Status is: Inpatient  Remains inpatient appropriate because:Inpatient level of care appropriate due to severity of illness   Dispo: The patient is from: Home              Anticipated d/c is to: Home              Anticipated d/c date is: > 1-2 days              Patient currently is not medically stable to d/c.   Difficult to place patient No   Barriers to discharge: Ongoing encephalopathy-not yet at baseline.  Antimicorbials  :    Anti-infectives (From admission, onward)   Start     Dose/Rate Route Frequency Ordered Stop   11/23/20 1600  ampicillin-sulbactam (UNASYN) 1.5 g in sodium chloride 0.9 % 100 mL IVPB        1.5 g 200 mL/hr over 30 Minutes Intravenous Every 8 hours 11/23/20 1510 11/28/20 1801   11/21/20 1000  remdesivir 100 mg in sodium chloride 0.9 % 100 mL IVPB       "Followed by" Linked Group Details    100 mg 200 mL/hr over 30 Minutes Intravenous Daily 11/20/20 0633 11/24/20 0959   11/20/20 0730  remdesivir 200 mg in sodium chloride 0.9% 250 mL IVPB       "  Followed by" Linked Group Details   200 mg 580 mL/hr over 30 Minutes Intravenous Once 11/20/20 8841 11/20/20 0900      Inpatient Medications  Scheduled Meds: . enoxaparin (LOVENOX) injection  40 mg Subcutaneous Q24H  . fluticasone furoate-vilanterol  1 puff Inhalation Daily  . pantoprazole  40 mg Oral Daily  . QUEtiapine  25 mg Oral QHS  . sodium chloride flush  3 mL Intravenous Q12H  . sodium chloride flush  3 mL Intravenous Q12H  . spiritus frumenti  1 each Oral BID  . umeclidinium bromide  1 puff Inhalation Daily   Continuous Infusions: . sodium chloride     PRN Meds:.sodium chloride, acetaminophen, albuterol, chlordiazePOXIDE, feeding supplement (NEPRO CARB STEADY), fentaNYL (SUBLIMAZE) injection, glycopyrrolate **OR** glycopyrrolate **OR** glycopyrrolate, haloperidol lactate, lip balm, morphine injection, morphine CONCENTRATE, [DISCONTINUED] ondansetron **OR** ondansetron (ZOFRAN) IV, polyethylene glycol, polyvinyl alcohol, Resource ThickenUp Clear, sodium chloride flush, sodium phosphate, spiritus frumenti   Time Spent in minutes  15  See all Orders from today for further details   Oren Binet M.D on 12/03/2020 at 3:58 PM  To page go to [email or web address] - use universal password  [facility] -  Office  [phone]    Objective:   Vitals:   12/02/20 0400 12/02/20 1209 12/02/20 2015 12/03/20 1438  BP: (!) 156/96 (!) 144/126 135/85 127/79  Pulse: 84 75 89 84  Resp:  14 18 18   Temp: 97.8 F (36.6 C) 97.7 F (36.5 C) 98.8 F (37.1 C) 97.6 F (36.4 C)  TempSrc: Oral Axillary Axillary Oral  SpO2: 100% 95% 94% 92%  Weight:      Height:        Wt Readings from Last 3 Encounters:  11/26/20 85.7 kg  05/24/20 77.1 kg  01/31/19 78.1 kg     Intake/Output Summary (Last 24 hours) at 12/03/2020 1558 Last  data filed at 12/03/2020 0826 Gross per 24 hour  Intake 3 ml  Output 175 ml  Net -172 ml     Physical Exam Lethargic.  Appears comfortable.     Data Review:    CBC Recent Labs  Lab 11/27/20 0402 11/28/20 0403 11/29/20 0106  WBC 12.1* 11.5* 12.2*  HGB 14.4 14.5 14.8  HCT 45.1 47.7 46.7  PLT 164 153 160  MCV 81.7 82.4 81.9  MCH 26.1 25.0* 26.0  MCHC 31.9 30.4 31.7  RDW 16.5* 17.0* 17.7*    Chemistries  Recent Labs  Lab 11/27/20 0402 11/28/20 0403 11/29/20 0106  NA 139 139 141  K 4.1 4.4 4.3  CL 104 106 108  CO2 26 23 23   GLUCOSE 170* 141* 167*  BUN 31* 30* 31*  CREATININE 1.05 0.93 1.11  CALCIUM 8.4* 8.5* 8.5*  AST 33 31 27  ALT 32 27 28  ALKPHOS 71 66 65  BILITOT 1.5* 1.2 1.6*   ------------------------------------------------------------------------------------------------------------------ No results for input(s): CHOL, HDL, LDLCALC, TRIG, CHOLHDL, LDLDIRECT in the last 72 hours.  Lab Results  Component Value Date   HGBA1C 5.9 (H) 05/24/2020   ------------------------------------------------------------------------------------------------------------------ No results for input(s): TSH, T4TOTAL, T3FREE, THYROIDAB in the last 72 hours.  Invalid input(s): FREET3 ------------------------------------------------------------------------------------------------------------------ No results for input(s): VITAMINB12, FOLATE, FERRITIN, TIBC, IRON, RETICCTPCT in the last 72 hours.  Coagulation profile No results for input(s): INR, PROTIME in the last 168 hours.  No results for input(s): DDIMER in the last 72 hours.  Cardiac Enzymes No results for input(s): CKMB, TROPONINI, MYOGLOBIN in the last 168 hours.  Invalid input(s): CK ------------------------------------------------------------------------------------------------------------------ No results found  for: BNP  Micro Results No results found for this or any previous visit (from the past 240  hour(s)).  Radiology Reports DG Chest Port 1 View  Result Date: 11/24/2020 CLINICAL DATA:  Shortness of breath. EXAM: PORTABLE CHEST 1 VIEW COMPARISON:  11/20/2020.  05/24/2020.  01/23/2019. FINDINGS: Mediastinum and hilar structures normal. Heart size normal. Low lung volumes with bibasilar atelectasis. Pleuroparenchymal thickening right upper lung suggesting scarring. Mild diffuse right lung and left mid lung interstitial infiltrates. No pleural effusion or pneumothorax. Surgical clips right upper abdomen. IMPRESSION: 1. Mild diffuse right lung and left mid lung interstitial infiltrates. 2. Low lung volumes with bibasilar atelectasis. Probable right upper lung pleural-parenchymal scarring. Electronically Signed   By: Marcello Moores  Register   On: 11/24/2020 06:43   DG Chest Portable 1 View  Result Date: 11/20/2020 CLINICAL DATA:  Shortness of breath and fevers EXAM: PORTABLE CHEST 1 VIEW COMPARISON:  05/24/2020 FINDINGS: Cardiac shadow is stable. Aortic calcifications are noted. Left lung is clear. Right lung demonstrates some upper lobe atelectatic changes. No bony abnormality is seen. IMPRESSION: Mild right lung atelectatic changes. Electronically Signed   By: Inez Catalina M.D.   On: 11/20/2020 03:18   VAS [place] LOWER EXTREMITY VENOUS (DVT)  Result Date: 11/24/2020  Lower Venous DVT Study Indications: Covid +ve, increasing d dimer.  Comparison Study: no prior Performing Technologist: Abram Sander RVS  Examination Guidelines: A complete evaluation includes B-mode imaging, spectral Doppler, color Doppler, and power Doppler as needed of all accessible portions of each vessel. Bilateral testing is considered an integral part of a complete examination. Limited examinations for reoccurring indications may be performed as noted. The reflux portion of the exam is performed with the patient in reverse Trendelenburg.  +---------+---------------+---------+-----------+----------+--------------+ RIGHT     CompressibilityPhasicitySpontaneityPropertiesThrombus Aging +---------+---------------+---------+-----------+----------+--------------+ CFV      Full           Yes      Yes                                 +---------+---------------+---------+-----------+----------+--------------+ SFJ      Full                                                        +---------+---------------+---------+-----------+----------+--------------+ FV Prox  Full                                                        +---------+---------------+---------+-----------+----------+--------------+ FV Mid   Full                                                        +---------+---------------+---------+-----------+----------+--------------+ FV DistalFull                                                        +---------+---------------+---------+-----------+----------+--------------+  PFV      Full                                                        +---------+---------------+---------+-----------+----------+--------------+ POP      Full           Yes      Yes                                 +---------+---------------+---------+-----------+----------+--------------+ PTV      Full                                                        +---------+---------------+---------+-----------+----------+--------------+ PERO     Full                                                        +---------+---------------+---------+-----------+----------+--------------+   +---------+---------------+---------+-----------+----------+--------------+ LEFT     CompressibilityPhasicitySpontaneityPropertiesThrombus Aging +---------+---------------+---------+-----------+----------+--------------+ CFV      Full           Yes      Yes                                 +---------+---------------+---------+-----------+----------+--------------+ SFJ      Full                                                         +---------+---------------+---------+-----------+----------+--------------+ FV Prox  Full                                                        +---------+---------------+---------+-----------+----------+--------------+ FV Mid   Full                                                        +---------+---------------+---------+-----------+----------+--------------+ FV DistalFull                                                        +---------+---------------+---------+-----------+----------+--------------+ PFV      Full                                                        +---------+---------------+---------+-----------+----------+--------------+  POP      Full           Yes      Yes                                 +---------+---------------+---------+-----------+----------+--------------+ PTV      Full                                                        +---------+---------------+---------+-----------+----------+--------------+ PERO     Full                                                        +---------+---------------+---------+-----------+----------+--------------+     Summary: BILATERAL: - No evidence of deep vein thrombosis seen in the lower extremities, bilaterally. - No evidence of superficial venous thrombosis in the lower extremities, bilaterally. -No evidence of popliteal cyst, bilaterally.   *See table(s) above for measurements and observations. Electronically signed by Jamelle Haring on 11/24/2020 at 3:49:43 PM.    Final

## 2020-12-04 DIAGNOSIS — G9349 Other encephalopathy: Secondary | ICD-10-CM | POA: Diagnosis not present

## 2020-12-04 DIAGNOSIS — U071 COVID-19: Secondary | ICD-10-CM | POA: Diagnosis not present

## 2020-12-04 MED ORDER — ACETAMINOPHEN 650 MG RE SUPP
650.0000 mg | RECTAL | Status: DC | PRN
  Administered 2020-12-04: 650 mg via RECTAL
  Filled 2020-12-04: qty 1

## 2020-12-04 NOTE — Progress Notes (Signed)
PROGRESS NOTE                                                                                                                                                                                                             Patient Demographics:    Ryan Tyler, is a 87 y.o. male, DOB - [birth year], KDT:[id number]  Outpatient Primary MD for the patient is Josetta Huddle, MD   Admit date - 11/20/2020   LOS - 28  Chief Complaint  Patient presents with  . Fever       Brief Narrative: Patient is a 87 y.o. male with PMHx of HTN, HLD, EtOH use, alcoholic pancreatitis, dementia, bladder cancer s/p TURBT-presenting with fever, poor oral intake-he was found to have AKI, COVID-19 infection-and subsequently admitted to the hospitalist service.  Further hospital stay was complicated by delirium/alcohol withdrawal.  COVID-19 vaccinated status: Vaccinated  Significant Events: 1/30>> Admit to [facility] for AKI/COVID-19 infection  Significant studies: 1/30>>Chest x-ray: Mild right lung atelectasis 2/3>> chest x-ray: Right/left midlung infiltrates. 2/3>> bilateral lower extremity Doppler: Negative for DVT  COVID-19 medications: Steroids: 1/30>> Remdesivir:1/30>>2/3  Antibiotics: Unasyn: 2/2>>2/8  Microbiology data: None  Procedures: None  Consults: Palliative Care  DVT prophylaxis:     Subjective:   Patient in bed, appears lethargic but in no discomfort waiting residential hospice bed.   Assessment  & Plan :    Patient is now under full comfort measures awaiting hospice bed.  Goal of care at present is full comfort.  We await residential hospice.  Other medical problems addressed earlier during this admission are below.     Acute metabolic encephalopathy: Multifactorial etiology- hospital delirium, alcohol withdrawal, COVID-19 likely culprits superimposed on dementia.  Initially mentation waxed and waned-however for the  past several days has been very lethargic and confused.  Continues to slowly deteriorate-much more lethargic today.  Goals of care for comfort-awaiting residential hospice bed.  AKI:-Mild-likely hemodynamically mediated-resolved with supportive care.  EtOH dependence/withdrawl: Per son-patient drinks at least 2 bottles of beer and 2 drinks of liquor on a daily basis-has completed a course of Librium taper.  Should be out of the window for alcohol withdrawal symptoms.  Mentation still confused-but I suspect this is mostly from dementia/delirium and encephalopathy due to pneumonia.  Allow-alcohol use for comfort/pleasure.  Pneumonia due to COVID-19 and aspiration/bacterial: Overtly aspirating across  all consistencies per SLP/nursing staff.  Okay for comfort feedings at this point.  Has completed a course of antimicrobial therapy.  Per palliative care-family does not desire any further escalation in care.  Fever: afebrile O2 requirements:  SpO2: 92 %   COVID-19 Labs: No results for input(s): DDIMER, FERRITIN, LDH, CRP in the last 72 hours.  No results found for: BNP  No results for input(s): PROCALCITON in the last 168 hours.  Lab Results  Component Value Date   SARSCOV2NAA POSITIVE (A) 11/20/2020   [place] NEGATIVE 05/24/2020   SARSCOV2NAA NOT DETECTED 01/23/2019     Elevated D-dimer: Due to COVID-19 related inflammation-lower extremity Dopplers negative-on prophylactic heparin.  Not hypoxic-clinical exam chest which is more consistent with aspiration-follow closely.  HTN: BP stable-lisinopril/HCTZ on hold.   GERD: Continue PPI  History of suspected COPD: Continue bronchodilators  History of bladder cancer  Hard of hearing  Severe deconditioning/debility/failure to thrive-very weak-debilitated-overtly aspirating-hardly any oral intake for the past several days-palliative care following-DNR in place.  Poor prognosis.  Palliative care: DNR in [place] frail/debilitated-with  multifactorial etiology-suspected aspiration-unfortunately-not much of progress in spite of above-noted cares-and continues to slowly deteriorate.  Prognosis is very poor and probably at end-of-life.  Palliative care now following-spoke to palliative care NP on 2/5--family aware of tenuous clinical status-no further escalation in care-they understand aspiration risk-they do not want feeding tubes.  Plans were to get him home with hospice care-however when hospice agency called son to arrange hospice care at home-patient's son was apparently intoxicated himself-and said the patient was a [employer]-and would "tough it out".  Subsequently social worker/APS were involved-after extensive discussion-family agreeable for residential hospice-awaiting residential hospice bed. .    GI prophylaxis: PPI    Condition - Guarded  Family Communication  :  Son Langston [place] Swe[place]ert-[phone]-updated on 12/04/20 unfortunately he was extremely drunk and incoherent.  Code Status : DNR  Diet :  Diet Order            DIET - DYS 1 Room service appropriate? Yes; Fluid consistency: Nectar Thick  Diet effective now                  Disposition Plan  :   Status is: Inpatient  Remains inpatient appropriate because:Inpatient level of care appropriate due to severity of illness   Dispo: The patient is from: Home              Anticipated d/c is to: Home              Anticipated d/c date is: > 1-2 days              Patient currently is not medically stable to d/c.   Difficult to place patient No   Barriers to discharge: Ongoing encephalopathy-not yet at baseline.  Antimicorbials  :    Anti-infectives (From admission, onward)   Start     Dose/Rate Route Frequency Ordered Stop   11/23/20 1600  ampicillin-sulbactam (UNASYN) 1.5 g in sodium chloride 0.9 % 100 mL IVPB        1.5 g 200 mL/hr over 30 Minutes Intravenous Every 8 hours 11/23/20 1510 11/28/20 1801   11/21/20 1000  remdesivir 100 mg in sodium chloride 0.9  % 100 mL IVPB       "Followed by" Linked Group Details   100 mg 200 mL/hr over 30 Minutes Intravenous Daily 11/20/20 0633 11/24/20 0959   11/20/20 0730  remdesivir 200 mg in sodium chloride 0.9%  250 mL IVPB       "Followed by" Linked Group Details   200 mg 580 mL/hr over 30 Minutes Intravenous Once 11/20/20 1610 11/20/20 0900      Inpatient Medications  Scheduled Meds: . enoxaparin (LOVENOX) injection  40 mg Subcutaneous Q24H  . fluticasone furoate-vilanterol  1 puff Inhalation Daily  . pantoprazole  40 mg Oral Daily  . QUEtiapine  25 mg Oral QHS  . sodium chloride flush  3 mL Intravenous Q12H  . sodium chloride flush  3 mL Intravenous Q12H  . spiritus frumenti  1 each Oral BID  . umeclidinium bromide  1 puff Inhalation Daily   Continuous Infusions: . sodium chloride     PRN Meds:.sodium chloride, acetaminophen, albuterol, chlordiazePOXIDE, feeding supplement (NEPRO CARB STEADY), fentaNYL (SUBLIMAZE) injection, glycopyrrolate **OR** glycopyrrolate **OR** glycopyrrolate, haloperidol lactate, lip balm, morphine injection, morphine CONCENTRATE, [DISCONTINUED] ondansetron **OR** ondansetron (ZOFRAN) IV, polyethylene glycol, polyvinyl alcohol, Resource ThickenUp Clear, sodium chloride flush, sodium phosphate, spiritus frumenti   Time Spent in minutes  15  See all Orders from today for further details   Lala Lund M.D on 12/04/2020 at 10:21 AM  To page go to [email or web address] - use universal password  [facility] -  Office  [phone]    Objective:   Vitals:   12/02/20 0400 12/02/20 1209 12/02/20 2015 12/03/20 1438  BP: (!) 156/96 (!) 144/126 135/85 127/79  Pulse: 84 75 89 84  Resp:  14 18 18   Temp: 97.8 F (36.6 C) 97.7 F (36.5 C) 98.8 F (37.1 C) 97.6 F (36.4 C)  TempSrc: Oral Axillary Axillary Oral  SpO2: 100% 95% 94% 92%  Weight:      Height:        Wt Readings from Last 3 Encounters:  11/26/20 85.7 kg  05/24/20 77.1 kg  01/31/19 78.1 kg    No  intake or output data in the 24 hours ending 12/04/20 1021   Physical Exam  Patient in bed appears to be in no discomfort however currently appears obtunded unable to answer questions or follow commands.   Data Review:    CBC Recent Labs  Lab 11/28/20 0403 11/29/20 0106  WBC 11.5* 12.2*  HGB 14.5 14.8  HCT 47.7 46.7  PLT 153 160  MCV 82.4 81.9  MCH 25.0* 26.0  MCHC 30.4 31.7  RDW 17.0* 17.7*    Chemistries  Recent Labs  Lab 11/28/20 0403 11/29/20 0106  NA 139 141  K 4.4 4.3  CL 106 108  CO2 23 23  GLUCOSE 141* 167*  BUN 30* 31*  CREATININE 0.93 1.11  CALCIUM 8.5* 8.5*  AST 31 27  ALT 27 28  ALKPHOS 66 65  BILITOT 1.2 1.6*   ------------------------------------------------------------------------------------------------------------------ No results for input(s): CHOL, HDL, LDLCALC, TRIG, CHOLHDL, LDLDIRECT in the last 72 hours.  Lab Results  Component Value Date   HGBA1C 5.9 (H) 05/24/2020   ------------------------------------------------------------------------------------------------------------------ No results for input(s): TSH, T4TOTAL, T3FREE, THYROIDAB in the last 72 hours.  Invalid input(s): FREET3 ------------------------------------------------------------------------------------------------------------------ No results for input(s): VITAMINB12, FOLATE, FERRITIN, TIBC, IRON, RETICCTPCT in the last 72 hours.  Coagulation profile No results for input(s): INR, PROTIME in the last 168 hours.  No results for input(s): DDIMER in the last 72 hours.  Cardiac Enzymes No results for input(s): CKMB, TROPONINI, MYOGLOBIN in the last 168 hours.  Invalid input(s): CK ------------------------------------------------------------------------------------------------------------------ No results found for: BNP  Micro Results No results found for this or any previous visit (from the past 240 hour(s)).  Radiology  Reports DG Chest Port 1 View  Result  Date: 11/24/2020 CLINICAL DATA:  Shortness of breath. EXAM: PORTABLE CHEST 1 VIEW COMPARISON:  11/20/2020.  05/24/2020.  01/23/2019. FINDINGS: Mediastinum and hilar structures normal. Heart size normal. Low lung volumes with bibasilar atelectasis. Pleuroparenchymal thickening right upper lung suggesting scarring. Mild diffuse right lung and left mid lung interstitial infiltrates. No pleural effusion or pneumothorax. Surgical clips right upper abdomen. IMPRESSION: 1. Mild diffuse right lung and left mid lung interstitial infiltrates. 2. Low lung volumes with bibasilar atelectasis. Probable right upper lung pleural-parenchymal scarring. Electronically Signed   By: Marcello Moores  Register   On: 11/24/2020 06:43   DG Chest Portable 1 View  Result Date: 11/20/2020 CLINICAL DATA:  Shortness of breath and fevers EXAM: PORTABLE CHEST 1 VIEW COMPARISON:  05/24/2020 FINDINGS: Cardiac shadow is stable. Aortic calcifications are noted. Left lung is clear. Right lung demonstrates some upper lobe atelectatic changes. No bony abnormality is seen. IMPRESSION: Mild right lung atelectatic changes. Electronically Signed   By: Inez Catalina M.D.   On: 11/20/2020 03:18   VAS [place] LOWER EXTREMITY VENOUS (DVT)  Result Date: 11/24/2020  Lower Venous DVT Study Indications: Covid +ve, increasing d dimer.  Comparison Study: no prior Performing Technologist: Abram Sander RVS  Examination Guidelines: A complete evaluation includes B-mode imaging, spectral Doppler, color Doppler, and power Doppler as needed of all accessible portions of each vessel. Bilateral testing is considered an integral part of a complete examination. Limited examinations for reoccurring indications may be performed as noted. The reflux portion of the exam is performed with the patient in reverse Trendelenburg.  +---------+---------------+---------+-----------+----------+--------------+ RIGHT    CompressibilityPhasicitySpontaneityPropertiesThrombus Aging  +---------+---------------+---------+-----------+----------+--------------+ CFV      Full           Yes      Yes                                 +---------+---------------+---------+-----------+----------+--------------+ SFJ      Full                                                        +---------+---------------+---------+-----------+----------+--------------+ FV Prox  Full                                                        +---------+---------------+---------+-----------+----------+--------------+ FV Mid   Full                                                        +---------+---------------+---------+-----------+----------+--------------+ FV DistalFull                                                        +---------+---------------+---------+-----------+----------+--------------+ PFV      Full                                                        +---------+---------------+---------+-----------+----------+--------------+  POP      Full           Yes      Yes                                 +---------+---------------+---------+-----------+----------+--------------+ PTV      Full                                                        +---------+---------------+---------+-----------+----------+--------------+ PERO     Full                                                        +---------+---------------+---------+-----------+----------+--------------+   +---------+---------------+---------+-----------+----------+--------------+ LEFT     CompressibilityPhasicitySpontaneityPropertiesThrombus Aging +---------+---------------+---------+-----------+----------+--------------+ CFV      Full           Yes      Yes                                 +---------+---------------+---------+-----------+----------+--------------+ SFJ      Full                                                         +---------+---------------+---------+-----------+----------+--------------+ FV Prox  Full                                                        +---------+---------------+---------+-----------+----------+--------------+ FV Mid   Full                                                        +---------+---------------+---------+-----------+----------+--------------+ FV DistalFull                                                        +---------+---------------+---------+-----------+----------+--------------+ PFV      Full                                                        +---------+---------------+---------+-----------+----------+--------------+ POP      Full           Yes      Yes                                 +---------+---------------+---------+-----------+----------+--------------+  PTV      Full                                                        +---------+---------------+---------+-----------+----------+--------------+ PERO     Full                                                        +---------+---------------+---------+-----------+----------+--------------+     Summary: BILATERAL: - No evidence of deep vein thrombosis seen in the lower extremities, bilaterally. - No evidence of superficial venous thrombosis in the lower extremities, bilaterally. -No evidence of popliteal cyst, bilaterally.   *See table(s) above for measurements and observations. Electronically signed by Jamelle Haring on 11/24/2020 at 3:49:43 PM.    Final

## 2020-12-04 NOTE — Plan of Care (Signed)

## 2020-12-05 DIAGNOSIS — U071 COVID-19: Secondary | ICD-10-CM | POA: Diagnosis not present

## 2020-12-05 DIAGNOSIS — G9349 Other encephalopathy: Secondary | ICD-10-CM | POA: Diagnosis not present

## 2020-12-05 MED ORDER — ONDANSETRON HCL 4 MG PO TABS: 4.0000 mg | ORAL_TABLET | Freq: Three times a day (TID) | ORAL | 0 refills | Status: AC | PRN

## 2020-12-05 MED ORDER — MORPHINE SULFATE (CONCENTRATE) 10 MG/0.5ML PO SOLN: 10.0000 mg | Freq: Four times a day (QID) | ORAL | 0 refills | Status: AC | PRN

## 2020-12-05 MED ORDER — ALBUTEROL SULFATE HFA 108 (90 BASE) MCG/ACT IN AERS: 2.0000 | INHALATION_SPRAY | Freq: Four times a day (QID) | RESPIRATORY_TRACT | 0 refills | Status: AC | PRN

## 2020-12-05 MED ORDER — LORAZEPAM 2 MG/ML PO CONC: 1.0000 mg | Freq: Four times a day (QID) | ORAL | 0 refills | Status: AC | PRN

## 2020-12-05 NOTE — Progress Notes (Signed)
   I have spoke to the pt's on over the phone to update him and let him know that we do have a bed to offer his dad today. He is unable to meet [place] to do paperwork b/c he does not have a car at this time. He is not planning to visit hospital today due to this. He also reports that he does not have an e-mail address.  A co-worker and I will be going to the pt's son home to do paperwork for pt transfer to the hospice home in [facility].  This will not be able to happen until paperwork signed.   I have updated the SW in transitional care services on the plan. She will set up ambulance. Webb Silversmith RN BSN [facility]   [phone]

## 2020-12-05 NOTE — TOC Transition Note (Addendum)
Transition of Care [facility]) - CM/SW Discharge Note   Patient Details  Name: Ryan Tyler MRN: [id number] Date of Birth: 05/26/1928  Transition of Care [facility] CM/SW Contact:  Ryan Halsted, LCSW Phone Number: 12/05/2020, 1:51 PM   Clinical Narrative:    Patient will DC to: [facility] date: 12/05/20 Family notified: Son, Ryan Tyler) Transport by: Ryan Tyler  APS, Park Liter, aware of discharge.   Per MD patient ready for DC to Hospice. RN to call report prior to discharge [phone]). RN, patient, patient's family, and facility notified of DC. Discharge Summary sent to facility. DC packet on chart. Ambulance transport requested for patient.   CSW will sign off for now as social work intervention is no longer needed. Please consult [place] again if new needs arise.      Final next level of care: [place] Barriers to Discharge: Barriers Resolved   Patient Goals and CMS Choice Patient states their goals for this hospitalization and ongoing recovery are:: comofort CMS Medicare.gov Compare Post Acute Care list provided to:: Patient Represenative (must comment) (son [name]) Choice offered to / list presented to : Adult Children  Discharge Placement                Patient to be transferred to facility by: [name] Name of family member notified: Son, Ryan Peace Patient and family notified of of transfer: 12/05/20  Discharge Plan and Services In-house Referral: Clinical Social Work Discharge Planning Services: CM Consult Post Acute Care Choice: Hospice          DME Arranged: 3-N-1           [place] Date [place]: 11/28/20 Time [place]: 1109 Representative spoke with at [place]: Velta Addison with [facility]  Social Determinants of Health ([place]) Interventions     Readmission Risk Interventions No flowsheet data found.

## 2020-12-05 NOTE — Progress Notes (Signed)
Patient was discharged to [facility] by MD order; discharged instructions review and sent to facility with care notes via PTAR; IV on LUA left in place per hospice facility request; patient will be transported to facility via [place]. Facility RN called for report.

## 2020-12-05 NOTE — Discharge Summary (Signed)
Ryan Tyler CNO:[id number] DOB: 10/30/1927 DOA: 11/20/2020  PCP: Josetta Huddle, MD  Admit date: 11/20/2020  Discharge date: 12/05/2020  Admitted From: Home   Disposition:  Res.Hospice   Recommendations for Outpatient Follow-up:   Follow up with PCP in 1-2 weeks  PCP Please obtain BMP/CBC, 2 view CXR in 1week,  (see Discharge instructions)   PCP Please follow up on the following pending results:    Home Health:    Equipment/Devices:   Consultations: Pall.Care  Discharge Condition: Guarded CODE STATUS: DNR    Diet Recommendation: Soft diet with nectar thick liquids for comfort only, very high risk for aspiration.  Diet Order            DIET - DYS 1 Room service appropriate? Yes; Fluid consistency: Nectar Thick  Diet effective now                  Chief Complaint  Patient presents with  . Fever     Brief history of present illness from the day of admission and additional interim summary    Patient is a 87 y.o. male with PMHx of HTN, HLD, EtOH use, alcoholic pancreatitis, dementia, bladder cancer s/p TURBT-presenting with fever, poor oral intake-he was found to have AKI, COVID-19 infection-and subsequently admitted to the hospitalist service.  Further hospital stay was complicated by delirium/alcohol withdrawal, developed aspiration and encephalopathy after which she was transitioned to full comfort care, seen by palliative care, now DC to residential hospice.                                                                  Hospital Course   Patient is now under full comfort measures awaiting hospice bed.  Goal of care at present is full comfort.  We await residential hospice bed, DC today likely.  Other medical problems addressed earlier during this admission are below.    Acute metabolic  encephalopathy: Multifactorial etiology- hospital delirium, alcohol withdrawal, COVID-19 likely culprits superimposed on dementia.  Initially mentation waxed and waned-however for the past several days has been very lethargic and confused.  Continues to slowly deteriorate-much more lethargic today.  Goals of care for comfort-awaiting residential hospice bed.  AKI:-Mild-likely hemodynamically mediated-resolved with supportive care.  EtOH dependence/withdrawl: Per son-patient drinks at least 2 bottles of beer and 2 drinks of liquor on a daily basis-has completed a course of Librium taper.  Should be out of the window for alcohol withdrawal symptoms.  Mentation still confused-but I suspect this is mostly from dementia/delirium and encephalopathy due to pneumonia.  Allow-alcohol use for comfort/pleasure.  Pneumonia due to COVID-19 and aspiration/bacterial: Overtly aspirating across all consistencies per SLP/nursing staff.  Okay for comfort feedings at this point.  Has completed a course of antimicrobial therapy.  Per palliative care-family does not  desire any further escalation in care.   Elevated D-dimer: Due to COVID-19 related inflammation-lower extremity Dopplers negative-on prophylactic heparin.  Not hypoxic-clinical exam chest which is more consistent with aspiration-follow closely.  HTN: BP stable-lisinopril/HCTZ on hold.   GERD: Continue PPI  History of suspected COPD: Continue bronchodilators  History of bladder cancer  Hard of hearing  Severe deconditioning/debility/failure to thrive-very weak-debilitated-overtly aspirating-hardly any oral intake for the past several days-palliative care following-DNR in place.  Poor prognosis.  Palliative care: DNR in [place] frail/debilitated-with multifactorial etiology-suspected aspiration-unfortunately-not much of progress in spite of above-noted cares-and continues to slowly deteriorate.  Prognosis is very poor and probably at  end-of-life.  Palliative care now following-spoke to palliative care NP on 2/5--family aware of tenuous clinical status-no further escalation in care-they understand aspiration risk-they do not want feeding tubes.  Plans were to get him home with hospice care-however when hospice agency called son to arrange hospice care at home-patient's son was apparently intoxicated himself-and said the patient was a [employer]-and would "tough it out".  Subsequently social worker/APS were involved-after extensive discussion-family agreeable for residential hospice-awaiting residential hospice bed.  Discharge diagnosis     Principal Problem:   Encephalopathy due to COVID-19 virus Active Problems:   Hypertension   CKD (chronic kidney disease), stage III (HCC)   AKI (acute kidney injury) ([place])   Dementia with behavioral disturbance (HCC)   DNR (do not resuscitate)   History of bladder cancer    Discharge instructions    Discharge Instructions    Discharge instructions   Complete by: As directed    Disposition.  Residential hospice Condition.  Guarded CODE STATUS.  DNR Activity.  With assistance as tolerated, full fall precautions. Diet.  Soft with feeding assistance and aspiration precautions. Goal of care.  Comfort.   Discharge instructions   Complete by: As directed    Disposition.  Residential hospice Condition.  Guarded CODE STATUS.  DNR Activity.  With assistance as tolerated, full fall precautions. Diet.  Soft, nectar thick liquids, for comfort only with feeding assistance and aspiration precautions. Goal of care.  Comfort.      Discharge Medications   Allergies as of 12/05/2020   No Known Allergies     Medication List    STOP taking these medications   acetaminophen 325 MG tablet Commonly known as: TYLENOL   aspirin EC 81 MG tablet   B COMPLEX-C PO   Centrum Silver 33+IRJ Tabs   folic acid 1 MG tablet Commonly known as: FOLVITE   lisinopril-hydrochlorothiazide 20-25  MG tablet Commonly known as: ZESTORETIC   pantoprazole 40 MG tablet Commonly known as: PROTONIX   Spiriva HandiHaler 18 MCG inhalation capsule Generic drug: tiotropium   thiamine 100 MG tablet   Vitamin D 50 MCG (2000 UT) tablet     TAKE these medications   albuterol 108 (90 Base) MCG/ACT inhaler Commonly known as: VENTOLIN HFA Inhale 2 puffs into the lungs every 6 (six) hours as needed for wheezing or shortness of breath.   Fluticasone-Salmeterol 100-50 MCG/DOSE Aepb Commonly known as: Advair Diskus Inhale 1 puff into the lungs in the morning and at bedtime.   LORazepam 2 MG/ML concentrated solution Commonly known as: ATIVAN Take 0.5 mLs (1 mg total) by mouth every 6 (six) hours as needed for anxiety.   morphine CONCENTRATE 10 MG/0.5ML Soln concentrated solution Take 0.5 mLs (10 mg total) by mouth every 6 (six) hours as needed for moderate pain or severe pain.   ondansetron 4 MG tablet  Commonly known as: Zofran Take 1 tablet (4 mg total) by mouth every 8 (eight) hours as needed for nausea or vomiting.        Follow-up Information    [facility] Follow up.   Why: A representative from [facility] will contact you to arrange start of care date and time Contact information: [place] [phone]              Major procedures and Radiology Reports - PLEASE review detailed and final reports thoroughly  -        DG Chest Port 1 View  Result Date: 11/24/2020 CLINICAL DATA:  Shortness of breath. EXAM: PORTABLE CHEST 1 VIEW COMPARISON:  11/20/2020.  05/24/2020.  01/23/2019. FINDINGS: Mediastinum and hilar structures normal. Heart size normal. Low lung volumes with bibasilar atelectasis. Pleuroparenchymal thickening right upper lung suggesting scarring. Mild diffuse right lung and left mid lung interstitial infiltrates. No pleural effusion or pneumothorax. Surgical clips right upper abdomen. IMPRESSION: 1. Mild diffuse  right lung and left mid lung interstitial infiltrates. 2. Low lung volumes with bibasilar atelectasis. Probable right upper lung pleural-parenchymal scarring. Electronically Signed   By: Marcello Moores  Register   On: 11/24/2020 06:43   DG Chest Portable 1 View  Result Date: 11/20/2020 CLINICAL DATA:  Shortness of breath and fevers EXAM: PORTABLE CHEST 1 VIEW COMPARISON:  05/24/2020 FINDINGS: Cardiac shadow is stable. Aortic calcifications are noted. Left lung is clear. Right lung demonstrates some upper lobe atelectatic changes. No bony abnormality is seen. IMPRESSION: Mild right lung atelectatic changes. Electronically Signed   By: Inez Catalina M.D.   On: 11/20/2020 03:18   VAS [place] LOWER EXTREMITY VENOUS (DVT)  Result Date: 11/24/2020  Lower Venous DVT Study Indications: Covid +ve, increasing d dimer.  Comparison Study: no prior Performing Technologist: Abram Sander RVS  Examination Guidelines: A complete evaluation includes B-mode imaging, spectral Doppler, color Doppler, and power Doppler as needed of all accessible portions of each vessel. Bilateral testing is considered an integral part of a complete examination. Limited examinations for reoccurring indications may be performed as noted. The reflux portion of the exam is performed with the patient in reverse Trendelenburg.  +---------+---------------+---------+-----------+----------+--------------+ RIGHT    CompressibilityPhasicitySpontaneityPropertiesThrombus Aging +---------+---------------+---------+-----------+----------+--------------+ CFV      Full           Yes      Yes                                 +---------+---------------+---------+-----------+----------+--------------+ SFJ      Full                                                        +---------+---------------+---------+-----------+----------+--------------+ FV Prox  Full                                                         +---------+---------------+---------+-----------+----------+--------------+ FV Mid   Full                                                        +---------+---------------+---------+-----------+----------+--------------+  FV DistalFull                                                        +---------+---------------+---------+-----------+----------+--------------+ PFV      Full                                                        +---------+---------------+---------+-----------+----------+--------------+ POP      Full           Yes      Yes                                 +---------+---------------+---------+-----------+----------+--------------+ PTV      Full                                                        +---------+---------------+---------+-----------+----------+--------------+ PERO     Full                                                        +---------+---------------+---------+-----------+----------+--------------+   +---------+---------------+---------+-----------+----------+--------------+ LEFT     CompressibilityPhasicitySpontaneityPropertiesThrombus Aging +---------+---------------+---------+-----------+----------+--------------+ CFV      Full           Yes      Yes                                 +---------+---------------+---------+-----------+----------+--------------+ SFJ      Full                                                        +---------+---------------+---------+-----------+----------+--------------+ FV Prox  Full                                                        +---------+---------------+---------+-----------+----------+--------------+ FV Mid   Full                                                        +---------+---------------+---------+-----------+----------+--------------+ FV DistalFull                                                         +---------+---------------+---------+-----------+----------+--------------+  PFV      Full                                                        +---------+---------------+---------+-----------+----------+--------------+ POP      Full           Yes      Yes                                 +---------+---------------+---------+-----------+----------+--------------+ PTV      Full                                                        +---------+---------------+---------+-----------+----------+--------------+ PERO     Full                                                        +---------+---------------+---------+-----------+----------+--------------+     Summary: BILATERAL: - No evidence of deep vein thrombosis seen in the lower extremities, bilaterally. - No evidence of superficial venous thrombosis in the lower extremities, bilaterally. -No evidence of popliteal cyst, bilaterally.   *See table(s) above for measurements and observations. Electronically signed by Jamelle Haring on 11/24/2020 at 3:49:43 PM.    Final     Micro Results    No results found for this or any previous visit (from the past 240 hour(s)).  Today   Subjective    Kartik Fernando today in bed appears to be in no discomfort, somnolent, unable to answer questions or follow commands.   Objective   Blood pressure (!) 147/78, pulse 98, temperature 98.3 F (36.8 C), temperature source Axillary, resp. rate 12, height 6' (1.829 m), weight 85.7 kg, SpO2 91 %.  No intake or output data in the 24 hours ending 12/05/20 1137  Exam  Patient in bed appears to be in no discomfort however currently appears obtunded unable to answer questions or follow commands.   Data Review   CBC w Diff:  Lab Results  Component Value Date   WBC 12.2 (H) 11/29/2020   HGB 14.8 11/29/2020   HCT 46.7 11/29/2020   PLT 160 11/29/2020   LYMPHOPCT 2 11/25/2020   MONOPCT 10 11/25/2020   EOSPCT 0 11/25/2020   BASOPCT 0 11/25/2020     CMP:  Lab Results  Component Value Date   NA 141 11/29/2020   K 4.3 11/29/2020   CL 108 11/29/2020   CO2 23 11/29/2020   BUN 31 (H) 11/29/2020   CREATININE 1.11 11/29/2020   PROT 5.2 (L) 11/29/2020   ALBUMIN 2.6 (L) 11/29/2020   BILITOT 1.6 (H) 11/29/2020   ALKPHOS 65 11/29/2020   AST 27 11/29/2020   ALT 28 11/29/2020  .   Total Time in preparing paper work, data evaluation and todays exam - 75 minutes  Lala Lund M.D on 12/05/2020 at 11:37 AM  [facility]

## 2020-12-05 NOTE — TOC Progression Note (Signed)
Transition of Care [facility]) - Progression Note    Patient Details  Name: Ryan Tyler MRN: [id number] Date of Birth: [birth year]  Transition of Care [facility]) CM/SW [place], LCSW Phone Number: 12/05/2020, 9:27 AM  Clinical Narrative:    CSW awaiting response from [place] on bed availability.    Expected Discharge Plan: [place] Barriers to Discharge: Continued Medical Work up  Expected Discharge Plan and Services Expected Discharge Plan: [place] In-house Referral: Clinical Social Work Discharge Planning Services: CM Consult Post Acute Care Choice: Hospice Living arrangements for the past 2 months: [place]                 DME Arranged: 3-N-1           [place] Date [place]: 11/28/20 Time [place]: 1109 Representative spoke with at [place]: Velta Addison with [facility]   Social Determinants of Health ([place]) Interventions    Readmission Risk Interventions No flowsheet data found.

## 2020-12-05 NOTE — Progress Notes (Signed)
Patient was taken by [facility] to the Hospice. All belongings given to the patient. Iv in place per request hospice nurse.

## 2020-12-05 NOTE — Progress Notes (Signed)
PROGRESS NOTE                                                                                                                                                                                                             Patient Demographics:    Ryan Tyler, is a 87 y.o. male, DOB - 25-Jun-1928, WUG:[id number]  Outpatient Primary MD for the patient is Josetta Huddle, MD   Admit date - 11/20/2020   LOS - 39  Chief Complaint  Patient presents with  . Fever       Brief Narrative: Patient is a 87 y.o. male with PMHx of HTN, HLD, EtOH use, alcoholic pancreatitis, dementia, bladder cancer s/p TURBT-presenting with fever, poor oral intake-he was found to have AKI, COVID-19 infection-and subsequently admitted to the hospitalist service.  Further hospital stay was complicated by delirium/alcohol withdrawal.  COVID-19 vaccinated status: Vaccinated  Significant Events: 1/30>> Admit to [facility] for AKI/COVID-19 infection  Significant studies: 1/30>>Chest x-ray: Mild right lung atelectasis 2/3>> chest x-ray: Right/left midlung infiltrates. 2/3>> bilateral lower extremity Doppler: Negative for DVT  COVID-19 medications: Steroids: 1/30>> Remdesivir:1/30>>2/3  Antibiotics: Unasyn: 2/2>>2/8  Microbiology data: None  Procedures: None  Consults: Palliative Care  DVT prophylaxis:     Subjective:   Patient in bed, appears lethargic but in no discomfort waiting residential hospice bed.   Assessment  & Plan :    Patient is now under full comfort measures awaiting hospice bed.  Goal of care at present is full comfort.  We await residential hospice.  Other medical problems addressed earlier during this admission are below.     Acute metabolic encephalopathy: Multifactorial etiology- hospital delirium, alcohol withdrawal, COVID-19 likely culprits superimposed on dementia.  Initially mentation waxed and waned-however for the  past several days has been very lethargic and confused.  Continues to slowly deteriorate-much more lethargic today.  Goals of care for comfort-awaiting residential hospice bed.  AKI:-Mild-likely hemodynamically mediated-resolved with supportive care.  EtOH dependence/withdrawl: Per son-patient drinks at least 2 bottles of beer and 2 drinks of liquor on a daily basis-has completed a course of Librium taper.  Should be out of the window for alcohol withdrawal symptoms.  Mentation still confused-but I suspect this is mostly from dementia/delirium and encephalopathy due to pneumonia.  Allow-alcohol use for comfort/pleasure.  Pneumonia due to COVID-19 and aspiration/bacterial: Overtly aspirating across  all consistencies per SLP/nursing staff.  Okay for comfort feedings at this point.  Has completed a course of antimicrobial therapy.  Per palliative care-family does not desire any further escalation in care.  Fever: afebrile O2 requirements:  SpO2: 91 %   COVID-19 Labs: No results for input(s): DDIMER, FERRITIN, LDH, CRP in the last 72 hours.  No results found for: BNP  No results for input(s): PROCALCITON in the last 168 hours.  Lab Results  Component Value Date   SARSCOV2NAA POSITIVE (A) 11/20/2020   [place] NEGATIVE 05/24/2020   SARSCOV2NAA NOT DETECTED 01/23/2019     Elevated D-dimer: Due to COVID-19 related inflammation-lower extremity Dopplers negative-on prophylactic heparin.  Not hypoxic-clinical exam chest which is more consistent with aspiration-follow closely.  HTN: BP stable-lisinopril/HCTZ on hold.   GERD: Continue PPI  History of suspected COPD: Continue bronchodilators  History of bladder cancer  Hard of hearing  Severe deconditioning/debility/failure to thrive-very weak-debilitated-overtly aspirating-hardly any oral intake for the past several days-palliative care following-DNR in place.  Poor prognosis.  Palliative care: DNR in [place] frail/debilitated-with  multifactorial etiology-suspected aspiration-unfortunately-not much of progress in spite of above-noted cares-and continues to slowly deteriorate.  Prognosis is very poor and probably at end-of-life.  Palliative care now following-spoke to palliative care NP on 2/5--family aware of tenuous clinical status-no further escalation in care-they understand aspiration risk-they do not want feeding tubes.  Plans were to get him home with hospice care-however when hospice agency called son to arrange hospice care at home-patient's son was apparently intoxicated himself-and said the patient was a [employer]-and would "tough it out".  Subsequently social worker/APS were involved-after extensive discussion-family agreeable for residential hospice-awaiting residential hospice bed. .    GI prophylaxis: PPI    Condition - Guarded  Family Communication  :  Son Dakoda Laventure Trachtenberg-[phone]-updated on 12/04/20 unfortunately he was extremely drunk and incoherent, kindly see previous notes, appeared intoxicated on multiple conversations with previous physician, social work, also inappropriate conversation with nursing staff and Education officer, museum for which he was warned earlier this admission.  Code Status : DNR  Diet :  Diet Order            DIET - DYS 1 Room service appropriate? Yes; Fluid consistency: Nectar Thick  Diet effective now                  Disposition Plan  :   Status is: Inpatient  Remains inpatient appropriate because:Inpatient level of care appropriate due to severity of illness   Dispo: The patient is from: Home              Anticipated d/c is to: Home              Anticipated d/c date is: > 1-2 days              Patient currently is not medically stable to d/c.   Difficult to place patient No   Barriers to discharge: Ongoing encephalopathy-not yet at baseline.  Antimicorbials  :    Anti-infectives (From admission, onward)   Start     Dose/Rate Route Frequency Ordered Stop   11/23/20  1600  ampicillin-sulbactam (UNASYN) 1.5 g in sodium chloride 0.9 % 100 mL IVPB        1.5 g 200 mL/hr over 30 Minutes Intravenous Every 8 hours 11/23/20 1510 11/28/20 1801   11/21/20 1000  remdesivir 100 mg in sodium chloride 0.9 % 100 mL IVPB       "Followed  by" Linked Group Details   100 mg 200 mL/hr over 30 Minutes Intravenous Daily 11/20/20 0633 11/24/20 0959   11/20/20 0730  remdesivir 200 mg in sodium chloride 0.9% 250 mL IVPB       "Followed by" Linked Group Details   200 mg 580 mL/hr over 30 Minutes Intravenous Once 11/20/20 6712 11/20/20 0900      Inpatient Medications  Scheduled Meds: . enoxaparin (LOVENOX) injection  40 mg Subcutaneous Q24H  . fluticasone furoate-vilanterol  1 puff Inhalation Daily  . pantoprazole  40 mg Oral Daily  . QUEtiapine  25 mg Oral QHS  . sodium chloride flush  3 mL Intravenous Q12H  . sodium chloride flush  3 mL Intravenous Q12H  . spiritus frumenti  1 each Oral BID  . umeclidinium bromide  1 puff Inhalation Daily   Continuous Infusions: . sodium chloride     PRN Meds:.sodium chloride, acetaminophen, acetaminophen, albuterol, chlordiazePOXIDE, feeding supplement (NEPRO CARB STEADY), fentaNYL (SUBLIMAZE) injection, glycopyrrolate **OR** glycopyrrolate **OR** glycopyrrolate, haloperidol lactate, lip balm, morphine injection, morphine CONCENTRATE, [DISCONTINUED] ondansetron **OR** ondansetron (ZOFRAN) IV, polyethylene glycol, polyvinyl alcohol, Resource ThickenUp Clear, sodium chloride flush, sodium phosphate, spiritus frumenti   Time Spent in minutes  15  See all Orders from today for further details   Lala Lund M.D on 12/05/2020 at 9:29 AM  To page go to [email or web address] - use universal password  [facility] -  Office  [phone]    Objective:   Vitals:   12/04/20 1816 12/04/20 2000 12/04/20 2113 12/05/20 0000  BP: 128/72 (!) 147/78    Pulse: (!) 109 98    Resp: 16 12    Temp: 99.6 F (37.6 C) (!) 101.8 F (38.8 C) (!)  101.1 F (38.4 C) 98.3 F (36.8 C)  TempSrc: Oral Axillary Axillary Axillary  SpO2: 90% 91%    Weight:      Height:        Wt Readings from Last 3 Encounters:  11/26/20 85.7 kg  05/24/20 77.1 kg  01/31/19 78.1 kg    No intake or output data in the 24 hours ending 12/05/20 2505   Physical Exam  Patient in bed appears to be in no discomfort however currently appears obtunded unable to answer questions or follow commands.   Data Review:    CBC Recent Labs  Lab 11/29/20 0106  WBC 12.2*  HGB 14.8  HCT 46.7  PLT 160  MCV 81.9  MCH 26.0  MCHC 31.7  RDW 17.7*    Chemistries  Recent Labs  Lab 11/29/20 0106  NA 141  K 4.3  CL 108  CO2 23  GLUCOSE 167*  BUN 31*  CREATININE 1.11  CALCIUM 8.5*  AST 27  ALT 28  ALKPHOS 65  BILITOT 1.6*   ------------------------------------------------------------------------------------------------------------------ No results for input(s): CHOL, HDL, LDLCALC, TRIG, CHOLHDL, LDLDIRECT in the last 72 hours.  Lab Results  Component Value Date   HGBA1C 5.9 (H) 05/24/2020   ------------------------------------------------------------------------------------------------------------------ No results for input(s): TSH, T4TOTAL, T3FREE, THYROIDAB in the last 72 hours.  Invalid input(s): FREET3 ------------------------------------------------------------------------------------------------------------------ No results for input(s): VITAMINB12, FOLATE, FERRITIN, TIBC, IRON, RETICCTPCT in the last 72 hours.  Coagulation profile No results for input(s): INR, PROTIME in the last 168 hours.  No results for input(s): DDIMER in the last 72 hours.  Cardiac Enzymes No results for input(s): CKMB, TROPONINI, MYOGLOBIN in the last 168 hours.  Invalid input(s): CK ------------------------------------------------------------------------------------------------------------------ No results found for: BNP  Micro Results No results found  for this  or any previous visit (from the past 240 hour(s)).  Radiology Reports DG Chest Port 1 View  Result Date: 11/24/2020 CLINICAL DATA:  Shortness of breath. EXAM: PORTABLE CHEST 1 VIEW COMPARISON:  11/20/2020.  05/24/2020.  01/23/2019. FINDINGS: Mediastinum and hilar structures normal. Heart size normal. Low lung volumes with bibasilar atelectasis. Pleuroparenchymal thickening right upper lung suggesting scarring. Mild diffuse right lung and left mid lung interstitial infiltrates. No pleural effusion or pneumothorax. Surgical clips right upper abdomen. IMPRESSION: 1. Mild diffuse right lung and left mid lung interstitial infiltrates. 2. Low lung volumes with bibasilar atelectasis. Probable right upper lung pleural-parenchymal scarring. Electronically Signed   By: Marcello Moores  Register   On: 11/24/2020 06:43   DG Chest Portable 1 View  Result Date: 11/20/2020 CLINICAL DATA:  Shortness of breath and fevers EXAM: PORTABLE CHEST 1 VIEW COMPARISON:  05/24/2020 FINDINGS: Cardiac shadow is stable. Aortic calcifications are noted. Left lung is clear. Right lung demonstrates some upper lobe atelectatic changes. No bony abnormality is seen. IMPRESSION: Mild right lung atelectatic changes. Electronically Signed   By: Inez Catalina M.D.   On: 11/20/2020 03:18   VAS [place] LOWER EXTREMITY VENOUS (DVT)  Result Date: 11/24/2020  Lower Venous DVT Study Indications: Covid +ve, increasing d dimer.  Comparison Study: no prior Performing Technologist: Abram Sander RVS  Examination Guidelines: A complete evaluation includes B-mode imaging, spectral Doppler, color Doppler, and power Doppler as needed of all accessible portions of each vessel. Bilateral testing is considered an integral part of a complete examination. Limited examinations for reoccurring indications may be performed as noted. The reflux portion of the exam is performed with the patient in reverse Trendelenburg.   +---------+---------------+---------+-----------+----------+--------------+ RIGHT    CompressibilityPhasicitySpontaneityPropertiesThrombus Aging +---------+---------------+---------+-----------+----------+--------------+ CFV      Full           Yes      Yes                                 +---------+---------------+---------+-----------+----------+--------------+ SFJ      Full                                                        +---------+---------------+---------+-----------+----------+--------------+ FV Prox  Full                                                        +---------+---------------+---------+-----------+----------+--------------+ FV Mid   Full                                                        +---------+---------------+---------+-----------+----------+--------------+ FV DistalFull                                                        +---------+---------------+---------+-----------+----------+--------------+ PFV  Full                                                        +---------+---------------+---------+-----------+----------+--------------+ POP      Full           Yes      Yes                                 +---------+---------------+---------+-----------+----------+--------------+ PTV      Full                                                        +---------+---------------+---------+-----------+----------+--------------+ PERO     Full                                                        +---------+---------------+---------+-----------+----------+--------------+   +---------+---------------+---------+-----------+----------+--------------+ LEFT     CompressibilityPhasicitySpontaneityPropertiesThrombus Aging +---------+---------------+---------+-----------+----------+--------------+ CFV      Full           Yes      Yes                                  +---------+---------------+---------+-----------+----------+--------------+ SFJ      Full                                                        +---------+---------------+---------+-----------+----------+--------------+ FV Prox  Full                                                        +---------+---------------+---------+-----------+----------+--------------+ FV Mid   Full                                                        +---------+---------------+---------+-----------+----------+--------------+ FV DistalFull                                                        +---------+---------------+---------+-----------+----------+--------------+ PFV      Full                                                        +---------+---------------+---------+-----------+----------+--------------+  POP      Full           Yes      Yes                                 +---------+---------------+---------+-----------+----------+--------------+ PTV      Full                                                        +---------+---------------+---------+-----------+----------+--------------+ PERO     Full                                                        +---------+---------------+---------+-----------+----------+--------------+     Summary: BILATERAL: - No evidence of deep vein thrombosis seen in the lower extremities, bilaterally. - No evidence of superficial venous thrombosis in the lower extremities, bilaterally. -No evidence of popliteal cyst, bilaterally.   *See table(s) above for measurements and observations. Electronically signed by Jamelle Haring on 11/24/2020 at 3:49:43 PM.    Final

## 2020-12-05 NOTE — Discharge Instructions (Signed)
Disposition.  Residential hospice Condition.  Guarded CODE STATUS.  DNR Activity.  With assistance as tolerated, full fall precautions. Diet.  Soft, nectar thick liquids, for comfort only with feeding assistance and aspiration precautions. Goal of care.  Comfort.

## 2020-12-05 NOTE — Progress Notes (Signed)
Patient waiting for PTAR. 

## 2020-12-20 DEATH — deceased
